# Patient Record
Sex: Female | Born: 1940 | Race: White | Hispanic: No | Marital: Married | Smoking: Never smoker
Health system: Southern US, Community
[De-identification: ages and names within clinical notes are randomized; demographics above are authoritative.]

## PROBLEM LIST (undated history)

## (undated) DIAGNOSIS — M8580 Other specified disorders of bone density and structure, unspecified site: Secondary | ICD-10-CM

## (undated) DIAGNOSIS — I10 Essential (primary) hypertension: Secondary | ICD-10-CM

## (undated) DIAGNOSIS — M5136 Other intervertebral disc degeneration, lumbar region: Secondary | ICD-10-CM

## (undated) DIAGNOSIS — E222 Syndrome of inappropriate secretion of antidiuretic hormone: Secondary | ICD-10-CM

## (undated) DIAGNOSIS — A4902 Methicillin resistant Staphylococcus aureus infection, unspecified site: Secondary | ICD-10-CM

## (undated) DIAGNOSIS — IMO0002 Reserved for concepts with insufficient information to code with codable children: Secondary | ICD-10-CM

## (undated) DIAGNOSIS — N841 Polyp of cervix uteri: Secondary | ICD-10-CM

## (undated) DIAGNOSIS — D649 Anemia, unspecified: Secondary | ICD-10-CM

## (undated) DIAGNOSIS — R519 Headache, unspecified: Secondary | ICD-10-CM

## (undated) DIAGNOSIS — R87619 Unspecified abnormal cytological findings in specimens from cervix uteri: Secondary | ICD-10-CM

## (undated) DIAGNOSIS — K219 Gastro-esophageal reflux disease without esophagitis: Secondary | ICD-10-CM

## (undated) DIAGNOSIS — I8390 Asymptomatic varicose veins of unspecified lower extremity: Secondary | ICD-10-CM

## (undated) DIAGNOSIS — Z9109 Other allergy status, other than to drugs and biological substances: Secondary | ICD-10-CM

## (undated) DIAGNOSIS — E871 Hypo-osmolality and hyponatremia: Secondary | ICD-10-CM

## (undated) DIAGNOSIS — R51 Headache: Secondary | ICD-10-CM

## (undated) DIAGNOSIS — N870 Mild cervical dysplasia: Secondary | ICD-10-CM

## (undated) DIAGNOSIS — C4330 Malignant melanoma of unspecified part of face: Secondary | ICD-10-CM

## (undated) DIAGNOSIS — M47818 Spondylosis without myelopathy or radiculopathy, sacral and sacrococcygeal region: Secondary | ICD-10-CM

## (undated) DIAGNOSIS — N3010 Interstitial cystitis (chronic) without hematuria: Secondary | ICD-10-CM

## (undated) DIAGNOSIS — M461 Sacroiliitis, not elsewhere classified: Secondary | ICD-10-CM

## (undated) DIAGNOSIS — M1990 Unspecified osteoarthritis, unspecified site: Secondary | ICD-10-CM

## (undated) HISTORY — DX: Interstitial cystitis (chronic) without hematuria: N30.10

## (undated) HISTORY — DX: Gastro-esophageal reflux disease without esophagitis: K21.9

## (undated) HISTORY — PX: WISDOM TOOTH EXTRACTION: SHX21

## (undated) HISTORY — DX: Reserved for concepts with insufficient information to code with codable children: IMO0002

## (undated) HISTORY — DX: Mild cervical dysplasia: N87.0

## (undated) HISTORY — DX: Malignant melanoma of unspecified part of face: C43.30

## (undated) HISTORY — DX: Other intervertebral disc degeneration, lumbar region: M51.36

## (undated) HISTORY — DX: Syndrome of inappropriate secretion of antidiuretic hormone: E22.2

## (undated) HISTORY — DX: Essential (primary) hypertension: I10

## (undated) HISTORY — DX: Other allergy status, other than to drugs and biological substances: Z91.09

## (undated) HISTORY — PX: MELANOMA EXCISION: SHX5266

## (undated) HISTORY — DX: Hypo-osmolality and hyponatremia: E87.1

## (undated) HISTORY — DX: Other specified disorders of bone density and structure, unspecified site: M85.80

## (undated) HISTORY — DX: Polyp of cervix uteri: N84.1

## (undated) HISTORY — PX: HAND SURGERY: SHX662

## (undated) HISTORY — PX: HERNIA REPAIR: SHX51

## (undated) HISTORY — DX: Unspecified osteoarthritis, unspecified site: M19.90

## (undated) HISTORY — DX: Unspecified abnormal cytological findings in specimens from cervix uteri: R87.619

---

## 1995-12-16 DIAGNOSIS — N841 Polyp of cervix uteri: Secondary | ICD-10-CM

## 1995-12-16 HISTORY — DX: Polyp of cervix uteri: N84.1

## 1996-12-15 DIAGNOSIS — E222 Syndrome of inappropriate secretion of antidiuretic hormone: Secondary | ICD-10-CM

## 1996-12-15 HISTORY — DX: Syndrome of inappropriate secretion of antidiuretic hormone: E22.2

## 1998-12-15 DIAGNOSIS — R87619 Unspecified abnormal cytological findings in specimens from cervix uteri: Secondary | ICD-10-CM

## 1998-12-15 HISTORY — DX: Unspecified abnormal cytological findings in specimens from cervix uteri: R87.619

## 1999-10-30 ENCOUNTER — Other Ambulatory Visit: Admission: RE | Admit: 1999-10-30 | Discharge: 1999-10-30 | Payer: Self-pay | Admitting: Obstetrics and Gynecology

## 1999-11-15 ENCOUNTER — Other Ambulatory Visit: Admission: RE | Admit: 1999-11-15 | Discharge: 1999-11-15 | Payer: Self-pay | Admitting: Obstetrics and Gynecology

## 1999-11-15 ENCOUNTER — Encounter (INDEPENDENT_AMBULATORY_CARE_PROVIDER_SITE_OTHER): Payer: Self-pay

## 2000-02-07 ENCOUNTER — Ambulatory Visit (HOSPITAL_BASED_OUTPATIENT_CLINIC_OR_DEPARTMENT_OTHER): Admission: RE | Admit: 2000-02-07 | Discharge: 2000-02-07 | Payer: Self-pay

## 2000-02-24 ENCOUNTER — Other Ambulatory Visit: Admission: RE | Admit: 2000-02-24 | Discharge: 2000-02-24 | Payer: Self-pay | Admitting: Obstetrics and Gynecology

## 2000-06-09 ENCOUNTER — Other Ambulatory Visit: Admission: RE | Admit: 2000-06-09 | Discharge: 2000-06-09 | Payer: Self-pay | Admitting: Obstetrics and Gynecology

## 2000-10-29 ENCOUNTER — Other Ambulatory Visit: Admission: RE | Admit: 2000-10-29 | Discharge: 2000-10-29 | Payer: Self-pay | Admitting: Obstetrics and Gynecology

## 2001-08-10 ENCOUNTER — Other Ambulatory Visit: Admission: RE | Admit: 2001-08-10 | Discharge: 2001-08-10 | Payer: Self-pay | Admitting: Obstetrics and Gynecology

## 2002-07-26 ENCOUNTER — Other Ambulatory Visit: Admission: RE | Admit: 2002-07-26 | Discharge: 2002-07-26 | Payer: Self-pay | Admitting: Obstetrics and Gynecology

## 2002-12-15 DIAGNOSIS — N3010 Interstitial cystitis (chronic) without hematuria: Secondary | ICD-10-CM

## 2002-12-15 HISTORY — DX: Interstitial cystitis (chronic) without hematuria: N30.10

## 2003-08-08 ENCOUNTER — Other Ambulatory Visit: Admission: RE | Admit: 2003-08-08 | Discharge: 2003-08-08 | Payer: Self-pay | Admitting: Obstetrics and Gynecology

## 2004-09-16 ENCOUNTER — Other Ambulatory Visit: Admission: RE | Admit: 2004-09-16 | Discharge: 2004-09-16 | Payer: Self-pay | Admitting: Obstetrics and Gynecology

## 2005-09-30 ENCOUNTER — Other Ambulatory Visit: Admission: RE | Admit: 2005-09-30 | Discharge: 2005-09-30 | Payer: Self-pay | Admitting: Obstetrics and Gynecology

## 2006-12-22 ENCOUNTER — Other Ambulatory Visit: Admission: RE | Admit: 2006-12-22 | Discharge: 2006-12-22 | Payer: Self-pay | Admitting: Obstetrics and Gynecology

## 2007-04-15 DIAGNOSIS — C4330 Malignant melanoma of unspecified part of face: Secondary | ICD-10-CM

## 2007-04-15 HISTORY — DX: Malignant melanoma of unspecified part of face: C43.30

## 2007-12-27 ENCOUNTER — Other Ambulatory Visit: Admission: RE | Admit: 2007-12-27 | Discharge: 2007-12-27 | Payer: Self-pay | Admitting: Obstetrics and Gynecology

## 2008-12-25 ENCOUNTER — Other Ambulatory Visit: Admission: RE | Admit: 2008-12-25 | Discharge: 2008-12-25 | Payer: Self-pay | Admitting: Obstetrics and Gynecology

## 2011-04-30 ENCOUNTER — Other Ambulatory Visit: Payer: Self-pay | Admitting: Internal Medicine

## 2011-05-02 ENCOUNTER — Ambulatory Visit
Admission: RE | Admit: 2011-05-02 | Discharge: 2011-05-02 | Disposition: A | Discharge status: Home / Self Care | Payer: Medicare Other | Source: Ambulatory Visit | Attending: Internal Medicine | Admitting: Internal Medicine

## 2011-05-02 MED ORDER — IOHEXOL 300 MG/ML  SOLN
100.0000 mL | Freq: Once | INTRAMUSCULAR | Status: AC | PRN
  Administered 2011-05-02: 100 mL via INTRAVENOUS

## 2011-05-02 NOTE — Op Note (Signed)
[place]  Patient:    Teresa Morales, Teresa Morales                        MRN: [id number] Proc. Date: 02/07/00 Adm. Date:  [id number] Attending:  Gennie Alma CC:         Genene Churn. Sherin Quarry, M.D.                           Operative Report  CCS# [id number]  PREOPERATIVE DIAGNOSIS:  Chronically incarcerated umbilical hernia.  POSTOPERATIVE DIAGNOSIS:  Chronically incarcerated umbilical hernia.  OPERATION PERFORMED:  Repair of umbilical hernia.  SURGEON:  Milus Mallick, M.D.  ANESTHESIA:  General endotracheal.  DESCRIPTION OF PROCEDURE: Under adequate general endotracheal anesthesia, the patients abdomen was prepared and draped in the usual fashion.  There was a 2.5 cm diameter soft mass underlying the umbilicus.  An infraumbilical incision was made and carried down into the subcutaneous space and almost immediately, a portion of lobulated fat exited the wound and had the appearance of preperitoneal fat. It was adherent to the underside of the umbilicus adjacent to the hernia sac that as quite narrow and of very small caliber.  It was dissected off of the umbilical lap and divided and at that point it could be seen that this was a chronically incarcerated umbilical hernia with the preperitoneal fat being what was incarcerated.  The preperitoneal fat was excised over hemostats which were ligated with 3-0 Vicryl.  At this point one could delineate the hernia defect which was  about 5 mm in diameter.  It was repaired with interrupted sutures of Surgilon, three in number.  The undersurface of the umbilical skin flap was fixed to the fascia with 3-0 Vicryl.  The subcuticular layer was reapproximated with a continuous suture of 5-0 Vicryl and half-inch Steri-Strips were applied to the skin.  Sterile dressing was applied.  Estimated blood loss for this procedure was negligible.  The patient tolerated the procedure well and left the operating room in  satisfactory condition. DD:  02/07/00 TD:  02/07/00 Job: [id number]

## 2011-12-19 DIAGNOSIS — J309 Allergic rhinitis, unspecified: Secondary | ICD-10-CM | POA: Diagnosis not present

## 2011-12-24 DIAGNOSIS — T6391XA Toxic effect of contact with unspecified venomous animal, accidental (unintentional), initial encounter: Secondary | ICD-10-CM | POA: Diagnosis not present

## 2011-12-26 DIAGNOSIS — J309 Allergic rhinitis, unspecified: Secondary | ICD-10-CM | POA: Diagnosis not present

## 2012-01-01 DIAGNOSIS — J309 Allergic rhinitis, unspecified: Secondary | ICD-10-CM | POA: Diagnosis not present

## 2012-01-15 DIAGNOSIS — Z1231 Encounter for screening mammogram for malignant neoplasm of breast: Secondary | ICD-10-CM | POA: Diagnosis not present

## 2012-01-21 DIAGNOSIS — T6391XA Toxic effect of contact with unspecified venomous animal, accidental (unintentional), initial encounter: Secondary | ICD-10-CM | POA: Diagnosis not present

## 2012-01-26 DIAGNOSIS — J309 Allergic rhinitis, unspecified: Secondary | ICD-10-CM | POA: Diagnosis not present

## 2012-01-26 DIAGNOSIS — R05 Cough: Secondary | ICD-10-CM | POA: Diagnosis not present

## 2012-01-30 DIAGNOSIS — J309 Allergic rhinitis, unspecified: Secondary | ICD-10-CM | POA: Diagnosis not present

## 2012-02-04 DIAGNOSIS — R279 Unspecified lack of coordination: Secondary | ICD-10-CM | POA: Diagnosis not present

## 2012-02-04 DIAGNOSIS — Z0000 Encounter for general adult medical examination without abnormal findings: Secondary | ICD-10-CM | POA: Diagnosis not present

## 2012-02-04 DIAGNOSIS — N3946 Mixed incontinence: Secondary | ICD-10-CM | POA: Diagnosis not present

## 2012-02-04 DIAGNOSIS — Z01419 Encounter for gynecological examination (general) (routine) without abnormal findings: Secondary | ICD-10-CM | POA: Diagnosis not present

## 2012-02-04 DIAGNOSIS — M6281 Muscle weakness (generalized): Secondary | ICD-10-CM | POA: Diagnosis not present

## 2012-02-04 DIAGNOSIS — Z124 Encounter for screening for malignant neoplasm of cervix: Secondary | ICD-10-CM | POA: Diagnosis not present

## 2012-02-27 DIAGNOSIS — T6391XA Toxic effect of contact with unspecified venomous animal, accidental (unintentional), initial encounter: Secondary | ICD-10-CM | POA: Diagnosis not present

## 2012-03-01 DIAGNOSIS — T6391XA Toxic effect of contact with unspecified venomous animal, accidental (unintentional), initial encounter: Secondary | ICD-10-CM | POA: Diagnosis not present

## 2012-03-04 DIAGNOSIS — J309 Allergic rhinitis, unspecified: Secondary | ICD-10-CM | POA: Diagnosis not present

## 2012-03-16 DIAGNOSIS — N3000 Acute cystitis without hematuria: Secondary | ICD-10-CM | POA: Diagnosis not present

## 2012-03-24 DIAGNOSIS — B373 Candidiasis of vulva and vagina: Secondary | ICD-10-CM | POA: Diagnosis not present

## 2012-03-24 DIAGNOSIS — N390 Urinary tract infection, site not specified: Secondary | ICD-10-CM | POA: Diagnosis not present

## 2012-03-29 DIAGNOSIS — R05 Cough: Secondary | ICD-10-CM | POA: Diagnosis not present

## 2012-03-29 DIAGNOSIS — T6391XA Toxic effect of contact with unspecified venomous animal, accidental (unintentional), initial encounter: Secondary | ICD-10-CM | POA: Diagnosis not present

## 2012-04-01 DIAGNOSIS — J309 Allergic rhinitis, unspecified: Secondary | ICD-10-CM | POA: Diagnosis not present

## 2012-04-02 DIAGNOSIS — J309 Allergic rhinitis, unspecified: Secondary | ICD-10-CM | POA: Diagnosis not present

## 2012-04-05 DIAGNOSIS — J309 Allergic rhinitis, unspecified: Secondary | ICD-10-CM | POA: Diagnosis not present

## 2012-04-09 DIAGNOSIS — J309 Allergic rhinitis, unspecified: Secondary | ICD-10-CM | POA: Diagnosis not present

## 2012-04-13 DIAGNOSIS — IMO0002 Reserved for concepts with insufficient information to code with codable children: Secondary | ICD-10-CM | POA: Diagnosis not present

## 2012-04-16 DIAGNOSIS — J309 Allergic rhinitis, unspecified: Secondary | ICD-10-CM | POA: Diagnosis not present

## 2012-04-22 DIAGNOSIS — J309 Allergic rhinitis, unspecified: Secondary | ICD-10-CM | POA: Diagnosis not present

## 2012-04-22 DIAGNOSIS — L820 Inflamed seborrheic keratosis: Secondary | ICD-10-CM | POA: Diagnosis not present

## 2012-04-22 DIAGNOSIS — Z85820 Personal history of malignant melanoma of skin: Secondary | ICD-10-CM | POA: Diagnosis not present

## 2012-04-22 DIAGNOSIS — L821 Other seborrheic keratosis: Secondary | ICD-10-CM | POA: Diagnosis not present

## 2012-04-22 DIAGNOSIS — L570 Actinic keratosis: Secondary | ICD-10-CM | POA: Diagnosis not present

## 2012-04-22 DIAGNOSIS — Z85828 Personal history of other malignant neoplasm of skin: Secondary | ICD-10-CM | POA: Diagnosis not present

## 2012-04-26 DIAGNOSIS — T6391XA Toxic effect of contact with unspecified venomous animal, accidental (unintentional), initial encounter: Secondary | ICD-10-CM | POA: Diagnosis not present

## 2012-04-28 DIAGNOSIS — Z79899 Other long term (current) drug therapy: Secondary | ICD-10-CM | POA: Diagnosis not present

## 2012-04-28 DIAGNOSIS — E559 Vitamin D deficiency, unspecified: Secondary | ICD-10-CM | POA: Diagnosis not present

## 2012-04-28 DIAGNOSIS — Z1331 Encounter for screening for depression: Secondary | ICD-10-CM | POA: Diagnosis not present

## 2012-04-28 DIAGNOSIS — I10 Essential (primary) hypertension: Secondary | ICD-10-CM | POA: Diagnosis not present

## 2012-04-28 DIAGNOSIS — Z0000 Encounter for general adult medical examination without abnormal findings: Secondary | ICD-10-CM | POA: Diagnosis not present

## 2012-04-28 DIAGNOSIS — N8111 Cystocele, midline: Secondary | ICD-10-CM | POA: Diagnosis not present

## 2012-04-28 DIAGNOSIS — N816 Rectocele: Secondary | ICD-10-CM | POA: Diagnosis not present

## 2012-04-28 DIAGNOSIS — G4700 Insomnia, unspecified: Secondary | ICD-10-CM | POA: Diagnosis not present

## 2012-04-28 DIAGNOSIS — M2550 Pain in unspecified joint: Secondary | ICD-10-CM | POA: Diagnosis not present

## 2012-04-28 DIAGNOSIS — K219 Gastro-esophageal reflux disease without esophagitis: Secondary | ICD-10-CM | POA: Diagnosis not present

## 2012-04-30 DIAGNOSIS — T6391XA Toxic effect of contact with unspecified venomous animal, accidental (unintentional), initial encounter: Secondary | ICD-10-CM | POA: Diagnosis not present

## 2012-05-06 DIAGNOSIS — J309 Allergic rhinitis, unspecified: Secondary | ICD-10-CM | POA: Diagnosis not present

## 2012-05-14 DIAGNOSIS — J309 Allergic rhinitis, unspecified: Secondary | ICD-10-CM | POA: Diagnosis not present

## 2012-05-20 DIAGNOSIS — J309 Allergic rhinitis, unspecified: Secondary | ICD-10-CM | POA: Diagnosis not present

## 2012-05-24 DIAGNOSIS — T6391XA Toxic effect of contact with unspecified venomous animal, accidental (unintentional), initial encounter: Secondary | ICD-10-CM | POA: Diagnosis not present

## 2012-05-28 DIAGNOSIS — J309 Allergic rhinitis, unspecified: Secondary | ICD-10-CM | POA: Diagnosis not present

## 2012-05-28 DIAGNOSIS — J019 Acute sinusitis, unspecified: Secondary | ICD-10-CM | POA: Diagnosis not present

## 2012-06-01 DIAGNOSIS — J309 Allergic rhinitis, unspecified: Secondary | ICD-10-CM | POA: Diagnosis not present

## 2012-06-01 DIAGNOSIS — J019 Acute sinusitis, unspecified: Secondary | ICD-10-CM | POA: Diagnosis not present

## 2012-06-07 DIAGNOSIS — J309 Allergic rhinitis, unspecified: Secondary | ICD-10-CM | POA: Diagnosis not present

## 2012-06-15 DIAGNOSIS — J309 Allergic rhinitis, unspecified: Secondary | ICD-10-CM | POA: Diagnosis not present

## 2012-06-21 DIAGNOSIS — T6391XA Toxic effect of contact with unspecified venomous animal, accidental (unintentional), initial encounter: Secondary | ICD-10-CM | POA: Diagnosis not present

## 2012-06-25 DIAGNOSIS — J309 Allergic rhinitis, unspecified: Secondary | ICD-10-CM | POA: Diagnosis not present

## 2012-06-29 DIAGNOSIS — B373 Candidiasis of vulva and vagina: Secondary | ICD-10-CM | POA: Diagnosis not present

## 2012-06-29 DIAGNOSIS — R319 Hematuria, unspecified: Secondary | ICD-10-CM | POA: Diagnosis not present

## 2012-06-29 DIAGNOSIS — N360 Urethral fistula: Secondary | ICD-10-CM | POA: Diagnosis not present

## 2012-07-02 DIAGNOSIS — R1084 Generalized abdominal pain: Secondary | ICD-10-CM | POA: Diagnosis not present

## 2012-07-02 DIAGNOSIS — J309 Allergic rhinitis, unspecified: Secondary | ICD-10-CM | POA: Diagnosis not present

## 2012-07-05 DIAGNOSIS — R109 Unspecified abdominal pain: Secondary | ICD-10-CM | POA: Diagnosis not present

## 2012-07-09 DIAGNOSIS — J309 Allergic rhinitis, unspecified: Secondary | ICD-10-CM | POA: Diagnosis not present

## 2012-07-10 DIAGNOSIS — M25579 Pain in unspecified ankle and joints of unspecified foot: Secondary | ICD-10-CM | POA: Diagnosis not present

## 2012-07-10 DIAGNOSIS — M2540 Effusion, unspecified joint: Secondary | ICD-10-CM | POA: Diagnosis not present

## 2012-07-16 DIAGNOSIS — J309 Allergic rhinitis, unspecified: Secondary | ICD-10-CM | POA: Diagnosis not present

## 2012-07-19 DIAGNOSIS — T6391XA Toxic effect of contact with unspecified venomous animal, accidental (unintentional), initial encounter: Secondary | ICD-10-CM | POA: Diagnosis not present

## 2012-07-23 DIAGNOSIS — T6391XA Toxic effect of contact with unspecified venomous animal, accidental (unintentional), initial encounter: Secondary | ICD-10-CM | POA: Diagnosis not present

## 2012-07-29 DIAGNOSIS — B351 Tinea unguium: Secondary | ICD-10-CM | POA: Diagnosis not present

## 2012-07-29 DIAGNOSIS — L570 Actinic keratosis: Secondary | ICD-10-CM | POA: Diagnosis not present

## 2012-07-29 DIAGNOSIS — J309 Allergic rhinitis, unspecified: Secondary | ICD-10-CM | POA: Diagnosis not present

## 2012-08-02 DIAGNOSIS — R05 Cough: Secondary | ICD-10-CM | POA: Diagnosis not present

## 2012-08-06 DIAGNOSIS — J309 Allergic rhinitis, unspecified: Secondary | ICD-10-CM | POA: Diagnosis not present

## 2012-08-13 DIAGNOSIS — J309 Allergic rhinitis, unspecified: Secondary | ICD-10-CM | POA: Diagnosis not present

## 2012-08-18 DIAGNOSIS — T6391XA Toxic effect of contact with unspecified venomous animal, accidental (unintentional), initial encounter: Secondary | ICD-10-CM | POA: Diagnosis not present

## 2012-08-20 DIAGNOSIS — J309 Allergic rhinitis, unspecified: Secondary | ICD-10-CM | POA: Diagnosis not present

## 2012-08-27 DIAGNOSIS — J309 Allergic rhinitis, unspecified: Secondary | ICD-10-CM | POA: Diagnosis not present

## 2012-09-03 DIAGNOSIS — J309 Allergic rhinitis, unspecified: Secondary | ICD-10-CM | POA: Diagnosis not present

## 2012-09-09 DIAGNOSIS — J309 Allergic rhinitis, unspecified: Secondary | ICD-10-CM | POA: Diagnosis not present

## 2012-09-13 DIAGNOSIS — I8310 Varicose veins of unspecified lower extremity with inflammation: Secondary | ICD-10-CM | POA: Diagnosis not present

## 2012-09-15 DIAGNOSIS — I8310 Varicose veins of unspecified lower extremity with inflammation: Secondary | ICD-10-CM | POA: Diagnosis not present

## 2012-09-17 DIAGNOSIS — R05 Cough: Secondary | ICD-10-CM | POA: Diagnosis not present

## 2012-09-17 DIAGNOSIS — T6391XA Toxic effect of contact with unspecified venomous animal, accidental (unintentional), initial encounter: Secondary | ICD-10-CM | POA: Diagnosis not present

## 2012-09-23 DIAGNOSIS — I8310 Varicose veins of unspecified lower extremity with inflammation: Secondary | ICD-10-CM | POA: Diagnosis not present

## 2012-09-24 DIAGNOSIS — J309 Allergic rhinitis, unspecified: Secondary | ICD-10-CM | POA: Diagnosis not present

## 2012-09-27 DIAGNOSIS — T6391XA Toxic effect of contact with unspecified venomous animal, accidental (unintentional), initial encounter: Secondary | ICD-10-CM | POA: Diagnosis not present

## 2012-10-08 DIAGNOSIS — J309 Allergic rhinitis, unspecified: Secondary | ICD-10-CM | POA: Diagnosis not present

## 2012-10-15 DIAGNOSIS — J309 Allergic rhinitis, unspecified: Secondary | ICD-10-CM | POA: Diagnosis not present

## 2012-10-18 DIAGNOSIS — H1044 Vernal conjunctivitis: Secondary | ICD-10-CM | POA: Diagnosis not present

## 2012-10-21 DIAGNOSIS — Z23 Encounter for immunization: Secondary | ICD-10-CM | POA: Diagnosis not present

## 2012-10-25 DIAGNOSIS — I8310 Varicose veins of unspecified lower extremity with inflammation: Secondary | ICD-10-CM | POA: Diagnosis not present

## 2012-10-27 DIAGNOSIS — I8310 Varicose veins of unspecified lower extremity with inflammation: Secondary | ICD-10-CM | POA: Diagnosis not present

## 2012-10-27 DIAGNOSIS — M79609 Pain in unspecified limb: Secondary | ICD-10-CM | POA: Diagnosis not present

## 2012-10-29 DIAGNOSIS — J309 Allergic rhinitis, unspecified: Secondary | ICD-10-CM | POA: Diagnosis not present

## 2012-11-01 DIAGNOSIS — J309 Allergic rhinitis, unspecified: Secondary | ICD-10-CM | POA: Diagnosis not present

## 2012-11-04 DIAGNOSIS — J309 Allergic rhinitis, unspecified: Secondary | ICD-10-CM | POA: Diagnosis not present

## 2012-11-10 DIAGNOSIS — J309 Allergic rhinitis, unspecified: Secondary | ICD-10-CM | POA: Diagnosis not present

## 2012-11-17 DIAGNOSIS — I8390 Asymptomatic varicose veins of unspecified lower extremity: Secondary | ICD-10-CM | POA: Diagnosis not present

## 2012-11-17 DIAGNOSIS — D692 Other nonthrombocytopenic purpura: Secondary | ICD-10-CM | POA: Diagnosis not present

## 2012-11-17 DIAGNOSIS — D1801 Hemangioma of skin and subcutaneous tissue: Secondary | ICD-10-CM | POA: Diagnosis not present

## 2012-11-17 DIAGNOSIS — L821 Other seborrheic keratosis: Secondary | ICD-10-CM | POA: Diagnosis not present

## 2012-11-17 DIAGNOSIS — Z85820 Personal history of malignant melanoma of skin: Secondary | ICD-10-CM | POA: Diagnosis not present

## 2012-11-18 DIAGNOSIS — M79609 Pain in unspecified limb: Secondary | ICD-10-CM | POA: Diagnosis not present

## 2012-11-18 DIAGNOSIS — I8310 Varicose veins of unspecified lower extremity with inflammation: Secondary | ICD-10-CM | POA: Diagnosis not present

## 2012-11-19 DIAGNOSIS — J309 Allergic rhinitis, unspecified: Secondary | ICD-10-CM | POA: Diagnosis not present

## 2012-11-22 DIAGNOSIS — J309 Allergic rhinitis, unspecified: Secondary | ICD-10-CM | POA: Diagnosis not present

## 2012-11-29 DIAGNOSIS — T6391XA Toxic effect of contact with unspecified venomous animal, accidental (unintentional), initial encounter: Secondary | ICD-10-CM | POA: Diagnosis not present

## 2012-12-03 DIAGNOSIS — T6391XA Toxic effect of contact with unspecified venomous animal, accidental (unintentional), initial encounter: Secondary | ICD-10-CM | POA: Diagnosis not present

## 2012-12-16 DIAGNOSIS — M7981 Nontraumatic hematoma of soft tissue: Secondary | ICD-10-CM | POA: Diagnosis not present

## 2012-12-16 DIAGNOSIS — M79609 Pain in unspecified limb: Secondary | ICD-10-CM | POA: Diagnosis not present

## 2012-12-16 DIAGNOSIS — I8310 Varicose veins of unspecified lower extremity with inflammation: Secondary | ICD-10-CM | POA: Diagnosis not present

## 2012-12-24 DIAGNOSIS — J309 Allergic rhinitis, unspecified: Secondary | ICD-10-CM | POA: Diagnosis not present

## 2012-12-31 DIAGNOSIS — T6391XA Toxic effect of contact with unspecified venomous animal, accidental (unintentional), initial encounter: Secondary | ICD-10-CM | POA: Diagnosis not present

## 2013-01-05 ENCOUNTER — Ambulatory Visit: Payer: Medicare Other | Attending: Sports Medicine | Admitting: Physical Therapy

## 2013-01-05 DIAGNOSIS — M25559 Pain in unspecified hip: Secondary | ICD-10-CM | POA: Insufficient documentation

## 2013-01-05 DIAGNOSIS — IMO0001 Reserved for inherently not codable concepts without codable children: Secondary | ICD-10-CM | POA: Insufficient documentation

## 2013-01-05 DIAGNOSIS — M2569 Stiffness of other specified joint, not elsewhere classified: Secondary | ICD-10-CM | POA: Insufficient documentation

## 2013-01-05 DIAGNOSIS — M545 Low back pain, unspecified: Secondary | ICD-10-CM | POA: Diagnosis not present

## 2013-01-07 ENCOUNTER — Ambulatory Visit: Payer: Medicare Other | Admitting: Physical Therapy

## 2013-01-10 DIAGNOSIS — M7981 Nontraumatic hematoma of soft tissue: Secondary | ICD-10-CM | POA: Diagnosis not present

## 2013-01-11 ENCOUNTER — Ambulatory Visit: Payer: Medicare Other | Admitting: Physical Therapy

## 2013-01-13 ENCOUNTER — Ambulatory Visit: Payer: Medicare Other | Admitting: Physical Therapy

## 2013-01-13 DIAGNOSIS — J309 Allergic rhinitis, unspecified: Secondary | ICD-10-CM | POA: Diagnosis not present

## 2013-01-18 ENCOUNTER — Ambulatory Visit: Payer: Medicare Other | Admitting: Physical Therapy

## 2013-01-18 DIAGNOSIS — J45909 Unspecified asthma, uncomplicated: Secondary | ICD-10-CM | POA: Diagnosis not present

## 2013-01-18 DIAGNOSIS — J309 Allergic rhinitis, unspecified: Secondary | ICD-10-CM | POA: Diagnosis not present

## 2013-01-20 ENCOUNTER — Ambulatory Visit: Payer: Medicare Other | Attending: Sports Medicine | Admitting: Physical Therapy

## 2013-01-20 DIAGNOSIS — M545 Low back pain, unspecified: Secondary | ICD-10-CM | POA: Insufficient documentation

## 2013-01-20 DIAGNOSIS — M2569 Stiffness of other specified joint, not elsewhere classified: Secondary | ICD-10-CM | POA: Diagnosis not present

## 2013-01-20 DIAGNOSIS — IMO0001 Reserved for inherently not codable concepts without codable children: Secondary | ICD-10-CM | POA: Diagnosis not present

## 2013-01-20 DIAGNOSIS — M25559 Pain in unspecified hip: Secondary | ICD-10-CM | POA: Insufficient documentation

## 2013-01-25 ENCOUNTER — Ambulatory Visit: Payer: Medicare Other | Admitting: Physical Therapy

## 2013-01-27 ENCOUNTER — Ambulatory Visit: Payer: Medicare Other | Admitting: Physical Therapy

## 2013-01-28 ENCOUNTER — Encounter: Payer: Medicare Other | Admitting: Physical Therapy

## 2013-02-01 ENCOUNTER — Ambulatory Visit: Payer: Medicare Other | Admitting: Physical Therapy

## 2013-02-02 DIAGNOSIS — J309 Allergic rhinitis, unspecified: Secondary | ICD-10-CM | POA: Diagnosis not present

## 2013-02-02 DIAGNOSIS — Z1231 Encounter for screening mammogram for malignant neoplasm of breast: Secondary | ICD-10-CM | POA: Diagnosis not present

## 2013-02-02 DIAGNOSIS — Z8262 Family history of osteoporosis: Secondary | ICD-10-CM | POA: Diagnosis not present

## 2013-02-02 DIAGNOSIS — M899 Disorder of bone, unspecified: Secondary | ICD-10-CM | POA: Diagnosis not present

## 2013-02-03 ENCOUNTER — Ambulatory Visit: Payer: Medicare Other | Admitting: Physical Therapy

## 2013-02-09 DIAGNOSIS — M899 Disorder of bone, unspecified: Secondary | ICD-10-CM | POA: Diagnosis not present

## 2013-02-09 DIAGNOSIS — Z01419 Encounter for gynecological examination (general) (routine) without abnormal findings: Secondary | ICD-10-CM | POA: Diagnosis not present

## 2013-02-10 ENCOUNTER — Ambulatory Visit: Payer: Medicare Other | Admitting: Physical Therapy

## 2013-02-23 DIAGNOSIS — J309 Allergic rhinitis, unspecified: Secondary | ICD-10-CM | POA: Diagnosis not present

## 2013-02-28 DIAGNOSIS — J309 Allergic rhinitis, unspecified: Secondary | ICD-10-CM | POA: Diagnosis not present

## 2013-03-07 DIAGNOSIS — T6391XA Toxic effect of contact with unspecified venomous animal, accidental (unintentional), initial encounter: Secondary | ICD-10-CM | POA: Diagnosis not present

## 2013-03-11 DIAGNOSIS — J309 Allergic rhinitis, unspecified: Secondary | ICD-10-CM | POA: Diagnosis not present

## 2013-03-18 DIAGNOSIS — J309 Allergic rhinitis, unspecified: Secondary | ICD-10-CM | POA: Diagnosis not present

## 2013-04-04 DIAGNOSIS — J309 Allergic rhinitis, unspecified: Secondary | ICD-10-CM | POA: Diagnosis not present

## 2013-04-08 DIAGNOSIS — T6391XA Toxic effect of contact with unspecified venomous animal, accidental (unintentional), initial encounter: Secondary | ICD-10-CM | POA: Diagnosis not present

## 2013-04-21 DIAGNOSIS — L820 Inflamed seborrheic keratosis: Secondary | ICD-10-CM | POA: Diagnosis not present

## 2013-04-21 DIAGNOSIS — Z85828 Personal history of other malignant neoplasm of skin: Secondary | ICD-10-CM | POA: Diagnosis not present

## 2013-04-21 DIAGNOSIS — T6391XA Toxic effect of contact with unspecified venomous animal, accidental (unintentional), initial encounter: Secondary | ICD-10-CM | POA: Diagnosis not present

## 2013-04-28 DIAGNOSIS — J309 Allergic rhinitis, unspecified: Secondary | ICD-10-CM | POA: Diagnosis not present

## 2013-05-06 DIAGNOSIS — J309 Allergic rhinitis, unspecified: Secondary | ICD-10-CM | POA: Diagnosis not present

## 2013-05-16 DIAGNOSIS — T6391XA Toxic effect of contact with unspecified venomous animal, accidental (unintentional), initial encounter: Secondary | ICD-10-CM | POA: Diagnosis not present

## 2013-05-19 DIAGNOSIS — L819 Disorder of pigmentation, unspecified: Secondary | ICD-10-CM | POA: Diagnosis not present

## 2013-05-19 DIAGNOSIS — Z85828 Personal history of other malignant neoplasm of skin: Secondary | ICD-10-CM | POA: Diagnosis not present

## 2013-05-19 DIAGNOSIS — D485 Neoplasm of uncertain behavior of skin: Secondary | ICD-10-CM | POA: Diagnosis not present

## 2013-05-19 DIAGNOSIS — L821 Other seborrheic keratosis: Secondary | ICD-10-CM | POA: Diagnosis not present

## 2013-05-19 DIAGNOSIS — Z85820 Personal history of malignant melanoma of skin: Secondary | ICD-10-CM | POA: Diagnosis not present

## 2013-05-19 DIAGNOSIS — L723 Sebaceous cyst: Secondary | ICD-10-CM | POA: Diagnosis not present

## 2013-05-19 DIAGNOSIS — D1801 Hemangioma of skin and subcutaneous tissue: Secondary | ICD-10-CM | POA: Diagnosis not present

## 2013-05-20 DIAGNOSIS — H6120 Impacted cerumen, unspecified ear: Secondary | ICD-10-CM | POA: Diagnosis not present

## 2013-05-20 DIAGNOSIS — H9209 Otalgia, unspecified ear: Secondary | ICD-10-CM | POA: Diagnosis not present

## 2013-05-23 DIAGNOSIS — J309 Allergic rhinitis, unspecified: Secondary | ICD-10-CM | POA: Diagnosis not present

## 2013-06-02 ENCOUNTER — Encounter: Payer: Self-pay | Admitting: Obstetrics and Gynecology

## 2013-06-03 ENCOUNTER — Ambulatory Visit (INDEPENDENT_AMBULATORY_CARE_PROVIDER_SITE_OTHER): Payer: Medicare Other | Admitting: Obstetrics and Gynecology

## 2013-06-03 ENCOUNTER — Encounter: Payer: Self-pay | Admitting: Gynecology

## 2013-06-03 VITALS — BP 120/60 | HR 76 | Ht 63.0 in | Wt 119.0 lb

## 2013-06-03 DIAGNOSIS — J309 Allergic rhinitis, unspecified: Secondary | ICD-10-CM | POA: Diagnosis not present

## 2013-06-03 DIAGNOSIS — B373 Candidiasis of vulva and vagina: Secondary | ICD-10-CM

## 2013-06-03 DIAGNOSIS — B3731 Acute candidiasis of vulva and vagina: Secondary | ICD-10-CM

## 2013-06-03 MED ORDER — TERCONAZOLE 0.8 % VA CREA: 1.0000 | TOPICAL_CREAM | Freq: Every day | VAGINAL | Status: DC

## 2013-06-03 NOTE — Patient Instructions (Addendum)
Monilial Vaginitis  Vaginitis in a soreness, swelling and redness (inflammation) of the vagina and vulva. Monilial vaginitis is not a sexually transmitted infection.  CAUSES   Yeast vaginitis is caused by yeast (candida) that is normally found in your vagina. With a yeast infection, the candida has overgrown in number to a point that upsets the chemical balance.  SYMPTOMS   · White, thick vaginal discharge.  · Swelling, itching, redness and irritation of the vagina and possibly the lips of the vagina (vulva).  · Burning or painful urination.  · Painful intercourse.  DIAGNOSIS   Things that may contribute to monilial vaginitis are:  · Postmenopausal and virginal states.  · Pregnancy.  · Infections.  · Being tired, sick or stressed, especially if you had monilial vaginitis in the past.  · Diabetes. Good control will help lower the chance.  · Birth control pills.  · Tight fitting garments.  · Using bubble bath, feminine sprays, douches or deodorant tampons.  · Taking certain medications that kill germs (antibiotics).  · Sporadic recurrence can occur if you become ill.  TREATMENT   Your caregiver will give you medication.  · There are several kinds of anti monilial vaginal creams and suppositories specific for monilial vaginitis. For recurrent yeast infections, use a suppository or cream in the vagina 2 times a week, or as directed.  · Anti-monilial or steroid cream for the itching or irritation of the vulva may also be used. Get your caregiver's permission.  · Painting the vagina with methylene blue solution may help if the monilial cream does not work.  · Eating yogurt may help prevent monilial vaginitis.  HOME CARE INSTRUCTIONS   · Finish all medication as prescribed.  · Do not have sex until treatment is completed or after your caregiver tells you it is okay.  · Take warm sitz baths.  · Do not douche.  · Do not use tampons, especially scented ones.  · Wear cotton underwear.  · Avoid tight pants and panty  hose.  · Tell your sexual partner that you have a yeast infection. They should go to their caregiver if they have symptoms such as mild rash or itching.  · Your sexual partner should be treated as well if your infection is difficult to eliminate.  · Practice safer sex. Use condoms.  · Some vaginal medications cause latex condoms to fail. Vaginal medications that harm condoms are:  · Cleocin cream.  · Butoconazole (Femstat®).  · Terconazole (Terazol®) vaginal suppository.  · Miconazole (Monistat®) (may be purchased over the counter).  SEEK MEDICAL CARE IF:   · You have a temperature by mouth above 102° F (38.9° C).  · The infection is getting worse after 2 days of treatment.  · The infection is not getting better after 3 days of treatment.  · You develop blisters in or around your vagina.  · You develop vaginal bleeding, and it is not your menstrual period.  · You have pain when you urinate.  · You develop intestinal problems.  · You have pain with sexual intercourse.  Document Released: 09/10/2005 Document Revised: 02/23/2012 Document Reviewed: 05/25/2009  ExitCare® Patient Information ©2014 [employer].

## 2013-06-03 NOTE — Progress Notes (Signed)
Patient ID: Teresa Morales, female   DOB: [birth year], 72 y.o.   MRN: [id number]  Subjective  Having vaginal irritation and burning.  No lesions. A little bit of discharge. Had Diflucan at home.  Took 2, three days apart. Got better but a couple of days ago burning and irritation, more than usual returned.   Has been using Mycolog II cream which helps. So, symptoms are now better.  Wanted to be seen before the weekend. Has had problems with incomplete treatment of yeast infections with Diflucan. Terazol has been successful in past.  Uses Estrace three times a week. No new exposures. Has been gardening.  Denies poison ivy.  Objective  Pelvic exam Vulva with mild erythema of the labia minora bilaterally. Vagina and cervix without lesions. No significant discharge noted. Small and nontender uterus. No adnexal masses or tenderness.  Wet prep - pH 4.0, no clue cells, no trichomonas, no hyphae.  Assessment  Probable recent yeast infection.    Plan  Education about risk factors for different types of vaginitis. Rx for patient to use Terazol 3 if symptoms increase.  See Epic orders. Discussed probiotics which the patient is already taking.

## 2013-06-04 ENCOUNTER — Encounter: Payer: Self-pay | Admitting: Obstetrics and Gynecology

## 2013-06-13 DIAGNOSIS — J309 Allergic rhinitis, unspecified: Secondary | ICD-10-CM | POA: Diagnosis not present

## 2013-06-21 DIAGNOSIS — T6391XA Toxic effect of contact with unspecified venomous animal, accidental (unintentional), initial encounter: Secondary | ICD-10-CM | POA: Diagnosis not present

## 2013-06-24 DIAGNOSIS — E559 Vitamin D deficiency, unspecified: Secondary | ICD-10-CM | POA: Diagnosis not present

## 2013-06-24 DIAGNOSIS — M2550 Pain in unspecified joint: Secondary | ICD-10-CM | POA: Diagnosis not present

## 2013-06-24 DIAGNOSIS — G4700 Insomnia, unspecified: Secondary | ICD-10-CM | POA: Diagnosis not present

## 2013-06-24 DIAGNOSIS — K219 Gastro-esophageal reflux disease without esophagitis: Secondary | ICD-10-CM | POA: Diagnosis not present

## 2013-06-24 DIAGNOSIS — Z79899 Other long term (current) drug therapy: Secondary | ICD-10-CM | POA: Diagnosis not present

## 2013-06-24 DIAGNOSIS — Z0000 Encounter for general adult medical examination without abnormal findings: Secondary | ICD-10-CM | POA: Diagnosis not present

## 2013-06-24 DIAGNOSIS — Z1331 Encounter for screening for depression: Secondary | ICD-10-CM | POA: Diagnosis not present

## 2013-06-24 DIAGNOSIS — I10 Essential (primary) hypertension: Secondary | ICD-10-CM | POA: Diagnosis not present

## 2013-06-24 DIAGNOSIS — N816 Rectocele: Secondary | ICD-10-CM | POA: Diagnosis not present

## 2013-06-24 DIAGNOSIS — N8111 Cystocele, midline: Secondary | ICD-10-CM | POA: Diagnosis not present

## 2013-06-27 DIAGNOSIS — J309 Allergic rhinitis, unspecified: Secondary | ICD-10-CM | POA: Diagnosis not present

## 2013-07-04 DIAGNOSIS — J309 Allergic rhinitis, unspecified: Secondary | ICD-10-CM | POA: Diagnosis not present

## 2013-07-06 DIAGNOSIS — T6391XA Toxic effect of contact with unspecified venomous animal, accidental (unintentional), initial encounter: Secondary | ICD-10-CM | POA: Diagnosis not present

## 2013-07-15 DIAGNOSIS — J309 Allergic rhinitis, unspecified: Secondary | ICD-10-CM | POA: Diagnosis not present

## 2013-07-21 DIAGNOSIS — J309 Allergic rhinitis, unspecified: Secondary | ICD-10-CM | POA: Diagnosis not present

## 2013-07-29 DIAGNOSIS — M76899 Other specified enthesopathies of unspecified lower limb, excluding foot: Secondary | ICD-10-CM | POA: Diagnosis not present

## 2013-08-01 DIAGNOSIS — J309 Allergic rhinitis, unspecified: Secondary | ICD-10-CM | POA: Diagnosis not present

## 2013-08-08 DIAGNOSIS — T6391XA Toxic effect of contact with unspecified venomous animal, accidental (unintentional), initial encounter: Secondary | ICD-10-CM | POA: Diagnosis not present

## 2013-08-16 DIAGNOSIS — J309 Allergic rhinitis, unspecified: Secondary | ICD-10-CM | POA: Diagnosis not present

## 2013-08-16 DIAGNOSIS — E871 Hypo-osmolality and hyponatremia: Secondary | ICD-10-CM | POA: Diagnosis not present

## 2013-08-16 DIAGNOSIS — I10 Essential (primary) hypertension: Secondary | ICD-10-CM | POA: Diagnosis not present

## 2013-08-22 DIAGNOSIS — J45909 Unspecified asthma, uncomplicated: Secondary | ICD-10-CM | POA: Diagnosis not present

## 2013-08-22 DIAGNOSIS — J309 Allergic rhinitis, unspecified: Secondary | ICD-10-CM | POA: Diagnosis not present

## 2013-08-29 DIAGNOSIS — J45909 Unspecified asthma, uncomplicated: Secondary | ICD-10-CM | POA: Diagnosis not present

## 2013-08-29 DIAGNOSIS — J309 Allergic rhinitis, unspecified: Secondary | ICD-10-CM | POA: Diagnosis not present

## 2013-09-05 DIAGNOSIS — T6391XA Toxic effect of contact with unspecified venomous animal, accidental (unintentional), initial encounter: Secondary | ICD-10-CM | POA: Diagnosis not present

## 2013-09-09 DIAGNOSIS — J309 Allergic rhinitis, unspecified: Secondary | ICD-10-CM | POA: Diagnosis not present

## 2013-09-19 DIAGNOSIS — J309 Allergic rhinitis, unspecified: Secondary | ICD-10-CM | POA: Diagnosis not present

## 2013-09-23 DIAGNOSIS — Z23 Encounter for immunization: Secondary | ICD-10-CM | POA: Diagnosis not present

## 2013-09-29 DIAGNOSIS — J309 Allergic rhinitis, unspecified: Secondary | ICD-10-CM | POA: Diagnosis not present

## 2013-10-03 DIAGNOSIS — T6391XA Toxic effect of contact with unspecified venomous animal, accidental (unintentional), initial encounter: Secondary | ICD-10-CM | POA: Diagnosis not present

## 2013-10-07 ENCOUNTER — Encounter: Payer: Self-pay | Admitting: Nurse Practitioner

## 2013-10-07 ENCOUNTER — Ambulatory Visit (INDEPENDENT_AMBULATORY_CARE_PROVIDER_SITE_OTHER): Payer: Medicare Other | Admitting: Nurse Practitioner

## 2013-10-07 VITALS — BP 144/92 | HR 68 | Ht 63.0 in | Wt 121.0 lb

## 2013-10-07 DIAGNOSIS — B373 Candidiasis of vulva and vagina: Secondary | ICD-10-CM | POA: Diagnosis not present

## 2013-10-07 MED ORDER — FLUCONAZOLE 150 MG PO TABS: 150.0000 mg | ORAL_TABLET | Freq: Once | ORAL | Status: DC

## 2013-10-07 MED ORDER — TERCONAZOLE 0.8 % VA CREA: 1.0000 | TOPICAL_CREAM | Freq: Every day | VAGINAL | Status: DC

## 2013-10-07 MED ORDER — NYSTATIN-TRIAMCINOLONE 100000-0.1 UNIT/GM-% EX OINT: TOPICAL_OINTMENT | Freq: Two times a day (BID) | CUTANEOUS | Status: DC

## 2013-10-07 NOTE — Patient Instructions (Signed)
Monilial Vaginitis  Vaginitis in a soreness, swelling and redness (inflammation) of the vagina and vulva. Monilial vaginitis is not a sexually transmitted infection.  CAUSES   Yeast vaginitis is caused by yeast (candida) that is normally found in your vagina. With a yeast infection, the candida has overgrown in number to a point that upsets the chemical balance.  SYMPTOMS   · White, thick vaginal discharge.  · Swelling, itching, redness and irritation of the vagina and possibly the lips of the vagina (vulva).  · Burning or painful urination.  · Painful intercourse.  DIAGNOSIS   Things that may contribute to monilial vaginitis are:  · Postmenopausal and virginal states.  · Pregnancy.  · Infections.  · Being tired, sick or stressed, especially if you had monilial vaginitis in the past.  · Diabetes. Good control will help lower the chance.  · Birth control pills.  · Tight fitting garments.  · Using bubble bath, feminine sprays, douches or deodorant tampons.  · Taking certain medications that kill germs (antibiotics).  · Sporadic recurrence can occur if you become ill.  TREATMENT   Your caregiver will give you medication.  · There are several kinds of anti monilial vaginal creams and suppositories specific for monilial vaginitis. For recurrent yeast infections, use a suppository or cream in the vagina 2 times a week, or as directed.  · Anti-monilial or steroid cream for the itching or irritation of the vulva may also be used. Get your caregiver's permission.  · Painting the vagina with methylene blue solution may help if the monilial cream does not work.  · Eating yogurt may help prevent monilial vaginitis.  HOME CARE INSTRUCTIONS   · Finish all medication as prescribed.  · Do not have sex until treatment is completed or after your caregiver tells you it is okay.  · Take warm sitz baths.  · Do not douche.  · Do not use tampons, especially scented ones.  · Wear cotton underwear.  · Avoid tight pants and panty  hose.  · Tell your sexual partner that you have a yeast infection. They should go to their caregiver if they have symptoms such as mild rash or itching.  · Your sexual partner should be treated as well if your infection is difficult to eliminate.  · Practice safer sex. Use condoms.  · Some vaginal medications cause latex condoms to fail. Vaginal medications that harm condoms are:  · Cleocin cream.  · Butoconazole (Femstat®).  · Terconazole (Terazol®) vaginal suppository.  · Miconazole (Monistat®) (may be purchased over the counter).  SEEK MEDICAL CARE IF:   · You have a temperature by mouth above 102° F (38.9° C).  · The infection is getting worse after 2 days of treatment.  · The infection is not getting better after 3 days of treatment.  · You develop blisters in or around your vagina.  · You develop vaginal bleeding, and it is not your menstrual period.  · You have pain when you urinate.  · You develop intestinal problems.  · You have pain with sexual intercourse.  Document Released: 09/10/2005 Document Revised: 02/23/2012 Document Reviewed: 05/25/2009  ExitCare® Patient Information ©2014 [employer].

## 2013-10-07 NOTE — Progress Notes (Signed)
Subjective:     Patient ID: Teresa Morales, female   DOB: 07/04/1941, 72 y.o.   MRN: [id number]  HPI  This 72 yo W Fe presents with yeast vaginitis. She had similar symptoms in June which improved with Diflucan and Terazol.  These symptoms seem to be worse since Monday.  Has used nystatin cream with some relief until last pm when she had increased itching. She is using her Estrace vaginal cream 1/2 gm 2-3 times weekly.  Denies urinary symptoms.   She also desires a consult visit about Vit D and Prolia.   Review of Systems  Constitutional: Negative for fever, chills and fatigue.  Respiratory: Negative.   Cardiovascular: Negative.   Gastrointestinal: Negative for nausea and vomiting.  Genitourinary: Positive for vaginal discharge. Negative for dysuria, urgency, frequency, flank pain and pelvic pain.  Musculoskeletal: Negative.   Skin: Negative.        Objective:   Physical Exam  Constitutional: She is oriented to person, place, and time. She appears well-developed and well-nourished. No distress.  Abdominal: Soft. She exhibits no distension. There is no tenderness. There is no rebound and no guarding.  Genitourinary:  External labia is red with irritation extending to perianal area. Small linear cuts consistent with yeast.  Small amount of vaginal discharge.  No pain on bimanual at the bladder.  Wet Prep: ph: 4.5; NSS: negative; KOH: + yeast.  Neurological: She is alert and oriented to person, place, and time.  Psychiatric: She has a normal mood and affect. Her behavior is normal. Judgment and thought content normal.       Assessment:     Yeast Vulvovaginitis   Osteopenia - borderline osteoporosis  Past use of Actonel and Boniva with significant muscle aches Plan:     Diflucan 150 mg X 2 doses along with Terazol vaginal cream.  She is going out of town in mid November and wants a back up dose if needed while away. Continue to monitor and if symptoms worsens to call back  She also  wanted to discuss her Vit D results at 71.8 - she is advised to reduce OTC Vit D 3 from 2000 IU daily to 1000 IU daily.  We also discussed her last BMD and she is considering Prolia as suggested but wants to speak to a specialist about all options.  I gave her Dr. Willeen Cass name and she might consider that.

## 2013-10-09 NOTE — Progress Notes (Signed)
Encounter reviewed by Dr. Brook Silva.  

## 2013-10-11 ENCOUNTER — Ambulatory Visit: Payer: Medicare Other | Admitting: Nurse Practitioner

## 2013-10-11 ENCOUNTER — Telehealth: Payer: Self-pay | Admitting: Emergency Medicine

## 2013-10-11 DIAGNOSIS — J309 Allergic rhinitis, unspecified: Secondary | ICD-10-CM | POA: Diagnosis not present

## 2013-10-11 NOTE — Telephone Encounter (Signed)
Spoke with patient. Asked to call by Lauro Franklin, FNP.  She took Diflucan Friday and started [employer] 3 day on Friday/Sat/Sun. Then took #2 Diflucan.  Patient states that itching has resolved slightly but still having external irritation. Per Patty, patient was advised that she will need to give it 3-4 more days to see if medication has taken effect. Patient is agreeable. Advised to limit itching of area, and use aveeno sitz baths, patient verbalized understanding and will call back if not improved in 3 days.   Patty, patient is requesting a 7 day treatment of terazole to have while she is out of town just in case these symptoms return. Can you order?

## 2013-10-19 NOTE — Telephone Encounter (Signed)
Patty can you review and advise? Okay to order patient 7 day treatment terazole to have while she is out of town?

## 2013-10-20 MED ORDER — TERCONAZOLE 0.4 % VA CREA: 1.0000 | TOPICAL_CREAM | Freq: Every day | VAGINAL | Status: DC

## 2013-10-20 NOTE — Telephone Encounter (Signed)
Patient notified treatment sent to [facility] per her request. She states she is feeling much better!

## 2013-10-21 DIAGNOSIS — T6391XA Toxic effect of contact with unspecified venomous animal, accidental (unintentional), initial encounter: Secondary | ICD-10-CM | POA: Diagnosis not present

## 2013-10-27 DIAGNOSIS — J309 Allergic rhinitis, unspecified: Secondary | ICD-10-CM | POA: Diagnosis not present

## 2013-11-08 DIAGNOSIS — J309 Allergic rhinitis, unspecified: Secondary | ICD-10-CM | POA: Diagnosis not present

## 2013-11-16 DIAGNOSIS — J309 Allergic rhinitis, unspecified: Secondary | ICD-10-CM | POA: Diagnosis not present

## 2013-11-16 DIAGNOSIS — E871 Hypo-osmolality and hyponatremia: Secondary | ICD-10-CM | POA: Diagnosis not present

## 2013-11-16 DIAGNOSIS — I10 Essential (primary) hypertension: Secondary | ICD-10-CM | POA: Diagnosis not present

## 2013-11-16 DIAGNOSIS — T6391XA Toxic effect of contact with unspecified venomous animal, accidental (unintentional), initial encounter: Secondary | ICD-10-CM | POA: Diagnosis not present

## 2013-11-21 DIAGNOSIS — J309 Allergic rhinitis, unspecified: Secondary | ICD-10-CM | POA: Diagnosis not present

## 2013-11-23 DIAGNOSIS — L723 Sebaceous cyst: Secondary | ICD-10-CM | POA: Diagnosis not present

## 2013-11-23 DIAGNOSIS — Z85828 Personal history of other malignant neoplasm of skin: Secondary | ICD-10-CM | POA: Diagnosis not present

## 2013-11-23 DIAGNOSIS — L821 Other seborrheic keratosis: Secondary | ICD-10-CM | POA: Diagnosis not present

## 2013-11-23 DIAGNOSIS — L819 Disorder of pigmentation, unspecified: Secondary | ICD-10-CM | POA: Diagnosis not present

## 2013-11-23 DIAGNOSIS — B351 Tinea unguium: Secondary | ICD-10-CM | POA: Diagnosis not present

## 2013-11-23 DIAGNOSIS — Z85820 Personal history of malignant melanoma of skin: Secondary | ICD-10-CM | POA: Diagnosis not present

## 2013-11-23 DIAGNOSIS — D1801 Hemangioma of skin and subcutaneous tissue: Secondary | ICD-10-CM | POA: Diagnosis not present

## 2013-11-23 DIAGNOSIS — D239 Other benign neoplasm of skin, unspecified: Secondary | ICD-10-CM | POA: Diagnosis not present

## 2013-11-25 DIAGNOSIS — J309 Allergic rhinitis, unspecified: Secondary | ICD-10-CM | POA: Diagnosis not present

## 2013-12-02 DIAGNOSIS — J309 Allergic rhinitis, unspecified: Secondary | ICD-10-CM | POA: Diagnosis not present

## 2013-12-12 DIAGNOSIS — J309 Allergic rhinitis, unspecified: Secondary | ICD-10-CM | POA: Diagnosis not present

## 2013-12-19 DIAGNOSIS — T6391XA Toxic effect of contact with unspecified venomous animal, accidental (unintentional), initial encounter: Secondary | ICD-10-CM | POA: Diagnosis not present

## 2013-12-26 DIAGNOSIS — J309 Allergic rhinitis, unspecified: Secondary | ICD-10-CM | POA: Diagnosis not present

## 2014-01-05 DIAGNOSIS — M47817 Spondylosis without myelopathy or radiculopathy, lumbosacral region: Secondary | ICD-10-CM | POA: Diagnosis not present

## 2014-01-05 DIAGNOSIS — J309 Allergic rhinitis, unspecified: Secondary | ICD-10-CM | POA: Diagnosis not present

## 2014-01-11 ENCOUNTER — Ambulatory Visit: Payer: Medicare Other | Attending: Sports Medicine | Admitting: Physical Therapy

## 2014-01-11 DIAGNOSIS — M25559 Pain in unspecified hip: Secondary | ICD-10-CM | POA: Diagnosis not present

## 2014-01-11 DIAGNOSIS — M545 Low back pain, unspecified: Secondary | ICD-10-CM | POA: Diagnosis not present

## 2014-01-11 DIAGNOSIS — I10 Essential (primary) hypertension: Secondary | ICD-10-CM | POA: Diagnosis not present

## 2014-01-11 DIAGNOSIS — IMO0001 Reserved for inherently not codable concepts without codable children: Secondary | ICD-10-CM | POA: Diagnosis not present

## 2014-01-11 DIAGNOSIS — J309 Allergic rhinitis, unspecified: Secondary | ICD-10-CM | POA: Diagnosis not present

## 2014-01-17 ENCOUNTER — Ambulatory Visit: Payer: Medicare Other | Attending: Sports Medicine | Admitting: Physical Therapy

## 2014-01-17 DIAGNOSIS — IMO0001 Reserved for inherently not codable concepts without codable children: Secondary | ICD-10-CM | POA: Diagnosis not present

## 2014-01-17 DIAGNOSIS — M545 Low back pain, unspecified: Secondary | ICD-10-CM | POA: Diagnosis not present

## 2014-01-17 DIAGNOSIS — I10 Essential (primary) hypertension: Secondary | ICD-10-CM | POA: Insufficient documentation

## 2014-01-17 DIAGNOSIS — M25559 Pain in unspecified hip: Secondary | ICD-10-CM | POA: Insufficient documentation

## 2014-01-18 DIAGNOSIS — T6391XA Toxic effect of contact with unspecified venomous animal, accidental (unintentional), initial encounter: Secondary | ICD-10-CM | POA: Diagnosis not present

## 2014-01-19 ENCOUNTER — Ambulatory Visit: Payer: Medicare Other | Admitting: Physical Therapy

## 2014-01-24 ENCOUNTER — Ambulatory Visit: Payer: Medicare Other | Admitting: Physical Therapy

## 2014-01-26 ENCOUNTER — Encounter: Payer: Medicare Other | Admitting: Physical Therapy

## 2014-01-27 ENCOUNTER — Ambulatory Visit: Payer: Medicare Other | Admitting: Physical Therapy

## 2014-01-27 DIAGNOSIS — J309 Allergic rhinitis, unspecified: Secondary | ICD-10-CM | POA: Diagnosis not present

## 2014-01-31 ENCOUNTER — Ambulatory Visit: Payer: Medicare Other | Admitting: Physical Therapy

## 2014-02-02 ENCOUNTER — Ambulatory Visit: Payer: Medicare Other | Admitting: Physical Therapy

## 2014-02-03 DIAGNOSIS — J309 Allergic rhinitis, unspecified: Secondary | ICD-10-CM | POA: Diagnosis not present

## 2014-02-06 DIAGNOSIS — H1130 Conjunctival hemorrhage, unspecified eye: Secondary | ICD-10-CM | POA: Diagnosis not present

## 2014-02-09 ENCOUNTER — Ambulatory Visit: Payer: Medicare Other | Admitting: Physical Therapy

## 2014-02-10 ENCOUNTER — Ambulatory Visit: Payer: Medicare Other | Admitting: Physical Therapy

## 2014-02-10 DIAGNOSIS — J309 Allergic rhinitis, unspecified: Secondary | ICD-10-CM | POA: Diagnosis not present

## 2014-02-13 ENCOUNTER — Ambulatory Visit: Payer: Self-pay | Admitting: Obstetrics and Gynecology

## 2014-02-13 ENCOUNTER — Ambulatory Visit: Payer: Self-pay | Admitting: Gynecology

## 2014-02-14 ENCOUNTER — Ambulatory Visit: Payer: Medicare Other | Attending: Sports Medicine | Admitting: Physical Therapy

## 2014-02-14 DIAGNOSIS — IMO0001 Reserved for inherently not codable concepts without codable children: Secondary | ICD-10-CM | POA: Insufficient documentation

## 2014-02-14 DIAGNOSIS — M25559 Pain in unspecified hip: Secondary | ICD-10-CM | POA: Diagnosis not present

## 2014-02-14 DIAGNOSIS — M545 Low back pain, unspecified: Secondary | ICD-10-CM | POA: Insufficient documentation

## 2014-02-14 DIAGNOSIS — I10 Essential (primary) hypertension: Secondary | ICD-10-CM | POA: Insufficient documentation

## 2014-02-16 ENCOUNTER — Ambulatory Visit (INDEPENDENT_AMBULATORY_CARE_PROVIDER_SITE_OTHER): Payer: Medicare Other | Admitting: Obstetrics and Gynecology

## 2014-02-16 ENCOUNTER — Encounter: Payer: Self-pay | Admitting: Obstetrics and Gynecology

## 2014-02-16 VITALS — BP 124/76 | HR 81 | Resp 14 | Ht 63.0 in | Wt 121.0 lb

## 2014-02-16 DIAGNOSIS — Z01419 Encounter for gynecological examination (general) (routine) without abnormal findings: Secondary | ICD-10-CM

## 2014-02-16 DIAGNOSIS — Z124 Encounter for screening for malignant neoplasm of cervix: Secondary | ICD-10-CM

## 2014-02-16 DIAGNOSIS — M899 Disorder of bone, unspecified: Secondary | ICD-10-CM

## 2014-02-16 DIAGNOSIS — M8580 Other specified disorders of bone density and structure, unspecified site: Secondary | ICD-10-CM

## 2014-02-16 DIAGNOSIS — M949 Disorder of cartilage, unspecified: Secondary | ICD-10-CM

## 2014-02-16 MED ORDER — ESTRADIOL 0.1 MG/GM VA CREA: TOPICAL_CREAM | VAGINAL | Status: DC

## 2014-02-16 NOTE — Patient Instructions (Signed)
EXERCISE AND DIET:  We recommended that you start or continue a regular exercise program for good health. Regular exercise means any activity that makes your heart beat faster and makes you sweat.  We recommend exercising at least 30 minutes per day at least 3 days a week, preferably 4 or 5.  We also recommend a diet low in fat and sugar.  Inactivity, poor dietary choices and obesity can cause diabetes, heart attack, stroke, and kidney damage, among others.    ALCOHOL AND SMOKING:  Women should limit their alcohol intake to no more than 7 drinks/beers/glasses of wine (combined, not each!) per week. Moderation of alcohol intake to this level decreases your risk of breast cancer and liver damage. And of course, no recreational drugs are part of a healthy lifestyle.  And absolutely no smoking or even second hand smoke. Most people know smoking can cause heart and lung diseases, but did you know it also contributes to weakening of your bones? Aging of your skin?  Yellowing of your teeth and nails?  CALCIUM AND VITAMIN D:  Adequate intake of calcium and Vitamin D are recommended.  The recommendations for exact amounts of these supplements seem to change often, but generally speaking 600 mg of calcium (either carbonate or citrate) and 800 units of Vitamin D per day seems prudent. Certain women may benefit from higher intake of Vitamin D.  If you are among these women, your doctor will have told you during your visit.    PAP SMEARS:  Pap smears, to check for cervical cancer or precancers,  have traditionally been done yearly, although recent scientific advances have shown that most women can have pap smears less often.  However, every woman still should have a physical exam from her gynecologist every year. It will include a breast check, inspection of the vulva and vagina to check for abnormal growths or skin changes, a visual exam of the cervix, and then an exam to evaluate the size and shape of the uterus and  ovaries.  And after 73 years of age, a rectal exam is indicated to check for rectal cancers. We will also provide age appropriate advice regarding health maintenance, like when you should have certain vaccines, screening for sexually transmitted diseases, bone density testing, colonoscopy, mammograms, etc.   MAMMOGRAMS:  All women over 40 years old should have a yearly mammogram. Many facilities now offer a "3D" mammogram, which may cost around $50 extra out of pocket. If possible,  we recommend you accept the option to have the 3D mammogram performed.  It both reduces the number of women who will be called back for extra views which then turn out to be normal, and it is better than the routine mammogram at detecting truly abnormal areas.    COLONOSCOPY:  Colonoscopy to screen for colon cancer is recommended for all women at age 50.  We know, you hate the idea of the prep.  We agree, BUT, having colon cancer and not knowing it is worse!!  Colon cancer so often starts as a polyp that can be seen and removed at colonscopy, which can quite literally save your life!  And if your first colonoscopy is normal and you have no family history of colon cancer, most women don't have to have it again for 10 years.  Once every ten years, you can do something that may end up saving your life, right?  We will be happy to help you get it scheduled when you are ready.    Be sure to check your insurance coverage so you understand how much it will cost.  It may be covered as a preventative service at no cost, but you should check your particular policy.     Denosumab injection What is this medicine? DENOSUMAB (den oh sue mab) slows bone breakdown. Prolia is used to treat osteoporosis in women after menopause and in men. Delton See is used to prevent bone fractures and other bone problems caused by cancer bone metastases. Delton See is also used to treat giant cell tumor of the bone. This medicine may be used for other purposes; ask your  health care provider or pharmacist if you have questions. COMMON BRAND NAME(S): Prolia, XGEVA What should I tell my health care provider before I take this medicine? They need to know if you have any of these conditions: -dental disease -eczema -infection or history of infections -kidney disease or on dialysis -low blood calcium or vitamin D -malabsorption syndrome -scheduled to have surgery or tooth extraction -taking medicine that contains denosumab -thyroid or parathyroid disease -an unusual reaction to denosumab, other medicines, foods, dyes, or preservatives -pregnant or trying to get pregnant -breast-feeding How should I use this medicine? This medicine is for injection under the skin. It is given by a health care professional in a hospital or clinic setting. If you are getting Prolia, a special MedGuide will be given to you by the pharmacist with each prescription and refill. Be sure to read this information carefully each time. For Prolia, talk to your pediatrician regarding the use of this medicine in children. Special care may be needed. For Delton See, talk to your pediatrician regarding the use of this medicine in children. While this drug may be prescribed for children as young as 13 years for selected conditions, precautions do apply. Overdosage: If you think you've taken too much of this medicine contact a poison control center or emergency room at once. Overdosage: If you think you have taken too much of this medicine contact a poison control center or emergency room at once. NOTE: This medicine is only for you. Do not share this medicine with others. What if I miss a dose? It is important not to miss your dose. Call your doctor or health care professional if you are unable to keep an appointment. What may interact with this medicine? Do not take this medicine with any of the following medications: -other medicines containing denosumab This medicine may also interact with the  following medications: -medicines that suppress the immune system -medicines that treat cancer -steroid medicines like prednisone or cortisone This list may not describe all possible interactions. Give your health care provider a list of all the medicines, herbs, non-prescription drugs, or dietary supplements you use. Also tell them if you smoke, drink alcohol, or use illegal drugs. Some items may interact with your medicine. What should I watch for while using this medicine? Visit your doctor or health care professional for regular checks on your progress. Your doctor or health care professional may order blood tests and other tests to see how you are doing. Call your doctor or health care professional if you get a cold or other infection while receiving this medicine. Do not treat yourself. This medicine may decrease your body's ability to fight infection. You should make sure you get enough calcium and vitamin D while you are taking this medicine, unless your doctor tells you not to. Discuss the foods you eat and the vitamins you take with your health care professional. See  your dentist regularly. Brush and floss your teeth as directed. Before you have any dental work done, tell your dentist you are receiving this medicine. Do not become pregnant while taking this medicine or for 5 months after stopping it. Women should inform their doctor if they wish to become pregnant or think they might be pregnant. There is a potential for serious side effects to an unborn child. Talk to your health care professional or pharmacist for more information. What side effects may I notice from receiving this medicine? Side effects that you should report to your doctor or health care professional as soon as possible: -allergic reactions like skin rash, itching or hives, swelling of the face, lips, or tongue -breathing problems -chest pain -fast, irregular heartbeat -feeling faint or lightheaded, falls -fever,  chills, or any other sign of infection -muscle spasms, tightening, or twitches -numbness or tingling -skin blisters or bumps, or is dry, peels, or red -slow healing or unexplained pain in the mouth or jaw -unusual bleeding or bruising Side effects that usually do not require medical attention (Report these to your doctor or health care professional if they continue or are bothersome.): -muscle pain -stomach upset, gas This list may not describe all possible side effects. Call your doctor for medical advice about side effects. You may report side effects to FDA at 1-800-FDA-1088. Where should I keep my medicine? This medicine is only given in a clinic, doctor's office, or other health care setting and will not be stored at home. NOTE: This sheet is a summary. It may not cover all possible information. If you have questions about this medicine, talk to your doctor, pharmacist, or health care provider.  2014, Elsevier/Gold Standard. (2012-05-31 12:37:47)  Raloxifene tablets What is this medicine? RALOXIFENE (ral OX i feen) reduces the amount of calcium lost from bones. It is used to treat and prevent osteoporosis in women who have experienced menopause. This medicine may be used for other purposes; ask your health care provider or pharmacist if you have questions. COMMON BRAND NAME(S): Evista What should I tell my health care provider before I take this medicine? They need to know if you have any of these conditions: -a history of blood clots -cancer -heart failure -liver disease -premenopausal -an unusual or allergic reaction to raloxifene, other medicines, foods, dyes, or preservatives -pregnant or trying to get pregnant -breast-feeding How should I use this medicine? Take this medicine by mouth with a glass of water. Follow the directions on the prescription label. The tablets can be taken with or without food. Take your doses at regular intervals. Do not take your medicine more often  than directed. Talk to your pediatrician regarding the use of this medicine in children. Special care may be needed. Overdosage: If you think you have taken too much of this medicine contact a poison control center or emergency room at once. NOTE: This medicine is only for you. Do not share this medicine with others. What if I miss a dose? If you miss a dose, take it as soon as you can. If it is almost time for your next dose, take only that dose. Do not take double or extra doses. What may interact with this medicine? -ampicillin -cholestyramine -colestipol -diazepam -diazoxide -female hormones like hormone replacement therapy -lidocaine -warfarin This list may not describe all possible interactions. Give your health care provider a list of all the medicines, herbs, non-prescription drugs, or dietary supplements you use. Also tell them if you smoke, drink alcohol, or  use illegal drugs. Some items may interact with your medicine. What should I watch for while using this medicine? Visit your doctor or health care professional for regular checks on your progress. Do not stop taking this medicine except on the advice of your doctor or health care professional. You should make sure you get enough calcium and vitamin D in your diet while you are taking this medicine. Discuss your dietary needs with your health care professional or nutritionist. Exercise may help to prevent bone loss. Discuss your exercise needs with your doctor or health care professional. This medicine can rarely cause blood clots. You should avoid long periods of bed rest while taking this medicine. If you are going to have surgery, tell your doctor or health care professional that you are taking this medicine. This medicine should be stopped at least 3 days before surgery. After surgery, it should be restarted only after you are walking again. It should not be restarted while you still need long periods of bed rest. You should not  smoke while taking this medicine. Smoking may also increase your risk of blood clots. Smoking can also decrease the effects of this medicine. This medicine does not prevent hot flashes. It may cause hot flashes in some patients at the start of therapy. What side effects may I notice from receiving this medicine? Side effects that you should report to your doctor or health care professional as soon as possible: -change in vision -chest pain -difficulty breathing -leg pain or swelling -skin rash, itching Side effects that usually do not require medical attention (report to your doctor or health care professional if they continue or are bothersome): -fluid build-up -leg cramps -stomach pain -sweating This list may not describe all possible side effects. Call your doctor for medical advice about side effects. You may report side effects to FDA at 1-800-FDA-1088. Where should I keep my medicine? Keep out of the reach of children. Store at room temperature between 15 and 30 degrees C (59 and 86 degrees F). Throw away any unused medicine after the expiration date. NOTE: This sheet is a summary. It may not cover all possible information. If you have questions about this medicine, talk to your doctor, pharmacist, or health care provider.  2014, Elsevier/Gold Standard. (2008-11-16 15:15:14)

## 2014-02-16 NOTE — Progress Notes (Signed)
GYNECOLOGY VISIT  PCP: Henrine Screws  Referring provider:   HPI: 73 y.o.   Married  Caucasian  female   G1P1001 with No LMP recorded. Patient is postmenopausal.   here for   Annual Exam  Having low back problems. Disc problems.  Also has hypermobile SI joint.  Has done physical therapy.  Doing also some weight bearing exercises.  Saw [facility].  Also starting low impact aerobics.    Asking to have comprehensive discussion her bone density report from 2014. Has osteopenia and has intolerance of bisphosphonates.  Had reflux with Fosamax.   Had joint aching with Actonel. Boniva caused joint aching.  Told to consider Prolia last year.   Will measure Vit D with Dr. Inda Merlin in June 2015.   Wants refill of her vaginal estrogen.  Hgb:  PCP Urine:  PCP  GYNECOLOGIC HISTORY: No LMP recorded. Patient is postmenopausal. Sexually active: yes  Partner preference: female Contraception:  Menopausal  Menopausal hormone therapy: Estrace Cream DES exposure:   no Blood transfusions: no   Sexually transmitted diseases:no    GYN procedures and prior surgeries:  11/14/99 Endometrial Bx, Benign Last mammogram:   02/02/13  Neg              Last pap and high risk HPV testing:   12/25/08 Neg. History of AGUS pap and a polyp was diagnosed.  History of abnormal pap smear:  Yes  6/89  Laser vaporization of Cervix   OB History   Grav Para Term Preterm Abortions TAB SAB Ect Mult Living   1 1 1       1        LIFESTYLE: Exercise:    Walking, low impact aerobics, weight bearing exercises           Tobacco: no Alcohol: no Drug use:  no  OTHER HEALTH MAINTENANCE: Tetanus/TDap: Will check with PCP (not sure) Gardisil: no Influenza:  yes Zostavax: yes  Bone density: 02/02/13 Osteopenia - T score of left hip -2.4, T score of spine -1.7 Colonoscopy: around 2012, repeat in 5 years  Benign polyp  Cholesterol check: 06/24/2013  Total 185   LDL 90   HDL 73   TRIG   68  Family  History  Problem Relation Age of Onset  . Other Mother 90    MVA   . Lupus Sister 30  . Prostate cancer Brother   . Hypertension Father   . Osteoporosis Sister   . Thyroid disease Sister   . Scoliosis Sister     There are no active problems to display for this patient.  Past Medical History  Diagnosis Date  . Osteopenia     intolerant of bisphosphonates  . SIADH (syndrome of inappropriate ADH production) 1998  . Endocervical polyp 1997  . Urethral stenosis     dilated  . Mild dysplasia of cervix     laser  . 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 Interstitial cystitis 2004  . GERD (gastroesophageal reflux disease)   . Melanoma of face 04/2007  . Environmental allergies   Hypertension - on medication.   Past Surgical History  Procedure Laterality Date  . Hernia repair  1995  . Wisdom tooth extraction      ALLERGIES: Darvocet; Floxin; Macrodantin; Penetrex; and Sulfa antibiotics  Current Outpatient Prescriptions  Medication Sig Dispense Refill  . azelastine (ASTELIN) 137 MCG/SPRAY nasal spray Place 1  spray into the nose 2 (two) times daily. Use in each nostril as directed      . Calcium Citrate-Vitamin D (CITRACAL + D PO) Take 2 tablets by mouth 3 (three) times daily.      . cetirizine (ZYRTEC) 10 MG tablet Take 10 mg by mouth daily.      . Cholecalciferol (VITAMIN D PO) Take 1,000 mg by mouth daily.       Marland Kitchen estradiol (ESTRACE) 0.1 MG/GM vaginal cream Place 0.5 g vaginally 3 (three) times a week.       . Misc Natural Products (OSTEO BI-FLEX JOINT SHIELD PO) Take 2 tablets by mouth daily.       . Montelukast Sodium (SINGULAIR PO) Take 1 tablet by mouth at bedtime.       . Multiple Vitamin (MULTIVITAMIN) capsule Take 1 capsule by mouth daily.      . OMEGA 3 1000 MG CAPS Take 1 capsule by mouth daily.       Marland Kitchen omeprazole (PRILOSEC) 20 MG capsule Take 20 mg by mouth 2 (two) times daily.        Marland Kitchen OVER THE COUNTER MEDICATION Tart cherry extract      . Triamcinolone Acetonide (NASACORT ALLERGY 24HR NA) Place into the nose.      . triamterene-hydrochlorothiazide (MAXZIDE-25) 37.5-25 MG per tablet Take 0.5 tablets by mouth daily.      . valsartan (DIOVAN) 80 MG tablet Take 80 mg by mouth daily.      . Eszopiclone (LUNESTA PO) Take by mouth at bedtime as needed.       . hydrocortisone valerate cream (WESTCORT) 0.2 % as needed.      . nystatin-triamcinolone ointment (MYCOLOG) Apply topically 2 (two) times daily.  30 g  1  . terconazole (TERAZOL 3) 0.8 % vaginal cream Place 1 applicator vaginally at bedtime. Use one applicator full qhs x 3 days  20 g  1  . terconazole (TERAZOL 7) 0.4 % vaginal cream Place 1 applicator vaginally at bedtime.  45 g  0   No current facility-administered medications for this visit.     ROS:  Pertinent items are noted in HPI.  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   PHYSICAL EXAMINATION:    BP 124/76  Pulse 81  Resp 14  Ht 5\' 3"  (1.6 m)  Wt 121 lb (54.885 kg)  BMI 21.44 kg/m2   Wt Readings from Last 3 Encounters:  02/16/14 121 lb (54.885 kg)  10/07/13 121 lb (54.885 kg)  06/03/13 119 lb (53.978 kg)     Ht Readings from Last 3 Encounters:  02/16/14 5\' 3"  (1.6 m)  10/07/13 5\' 3"  (1.6 m)  06/03/13 5\' 3"  (1.6 m)    General appearance: alert, cooperative and appears stated age Head: Normocephalic, without obvious abnormality, atraumatic Neck: no adenopathy, supple, symmetrical, trachea midline and thyroid not enlarged, symmetric, no tenderness/mass/nodules Lungs: clear to auscultation bilaterally Breasts: Inspection negative, No nipple retraction or dimpling, No nipple discharge or bleeding, No axillary or supraclavicular adenopathy, Normal to palpation without dominant masses Heart: regular rate and rhythm Abdomen: soft, non-tender; no masses,  no organomegaly Extremities: extremities normal, atraumatic, no cyanosis or  edema Skin: Skin color, texture, turgor normal. No rashes or lesions Lymph nodes: Cervical, supraclavicular, and axillary nodes normal. No abnormal inguinal nodes palpated Neurologic: Grossly normal  Pelvic: External genitalia:  no lesions              Urethra:  normal appearing urethra with  no masses, tenderness or lesions              Bartholins and Skenes: normal                 Vagina: normal appearing vagina with normal color and discharge, no lesions, minimal cystocele and rectocele.               Cervix: normal appearance              Pap and high risk HPV testing done: yes.        Bimanual Exam:  Uterus:  uterus is normal size, shape, consistency and nontender                                      Adnexa: normal adnexa in size, nontender and no masses                                      Rectovaginal: Confirms                                      Anus:  normal sphincter tone, no lesions  ASSESSMENT  Normal gynecologic exam. Osteopenia.  Remote history of cervical cryotherapy and AGUS pap.   PLAN  Mammogram recommended yearly.   Patient will schedule.  Pap smear and high risk HPV testing performed.  Refill Estrace cream.  Discussed risks of thromboembolic events and breast cancer.  Counseled on self breast exam, Calcium and vitamin D intake, exercise. Patient declines prescription for Prolia or Evista, which were reviewed today.  Printed materials also to patient regarding these options. Next bone density in one year.  Return annually or prn   An After Visit Summary was printed and given to the patient.  25 minute additional discussion regarding osteopenia and treatment options.

## 2014-02-17 LAB — IPS PAP SMEAR ONLY

## 2014-02-28 DIAGNOSIS — J309 Allergic rhinitis, unspecified: Secondary | ICD-10-CM | POA: Diagnosis not present

## 2014-03-02 DIAGNOSIS — Z1231 Encounter for screening mammogram for malignant neoplasm of breast: Secondary | ICD-10-CM | POA: Diagnosis not present

## 2014-03-06 DIAGNOSIS — T6391XA Toxic effect of contact with unspecified venomous animal, accidental (unintentional), initial encounter: Secondary | ICD-10-CM | POA: Diagnosis not present

## 2014-03-09 DIAGNOSIS — J309 Allergic rhinitis, unspecified: Secondary | ICD-10-CM | POA: Diagnosis not present

## 2014-03-22 DIAGNOSIS — J309 Allergic rhinitis, unspecified: Secondary | ICD-10-CM | POA: Diagnosis not present

## 2014-04-03 DIAGNOSIS — R109 Unspecified abdominal pain: Secondary | ICD-10-CM | POA: Diagnosis not present

## 2014-04-03 DIAGNOSIS — J309 Allergic rhinitis, unspecified: Secondary | ICD-10-CM | POA: Diagnosis not present

## 2014-04-07 DIAGNOSIS — T6391XA Toxic effect of contact with unspecified venomous animal, accidental (unintentional), initial encounter: Secondary | ICD-10-CM | POA: Diagnosis not present

## 2014-04-13 DIAGNOSIS — H6120 Impacted cerumen, unspecified ear: Secondary | ICD-10-CM | POA: Diagnosis not present

## 2014-04-13 DIAGNOSIS — R109 Unspecified abdominal pain: Secondary | ICD-10-CM | POA: Diagnosis not present

## 2014-04-14 DIAGNOSIS — J309 Allergic rhinitis, unspecified: Secondary | ICD-10-CM | POA: Diagnosis not present

## 2014-04-21 DIAGNOSIS — J309 Allergic rhinitis, unspecified: Secondary | ICD-10-CM | POA: Diagnosis not present

## 2014-05-02 DIAGNOSIS — J309 Allergic rhinitis, unspecified: Secondary | ICD-10-CM | POA: Diagnosis not present

## 2014-05-03 DIAGNOSIS — J309 Allergic rhinitis, unspecified: Secondary | ICD-10-CM | POA: Diagnosis not present

## 2014-05-04 DIAGNOSIS — L570 Actinic keratosis: Secondary | ICD-10-CM | POA: Diagnosis not present

## 2014-05-04 DIAGNOSIS — D485 Neoplasm of uncertain behavior of skin: Secondary | ICD-10-CM | POA: Diagnosis not present

## 2014-05-04 DIAGNOSIS — Z85828 Personal history of other malignant neoplasm of skin: Secondary | ICD-10-CM | POA: Diagnosis not present

## 2014-05-10 ENCOUNTER — Other Ambulatory Visit: Payer: Self-pay | Admitting: Gastroenterology

## 2014-05-10 DIAGNOSIS — K439 Ventral hernia without obstruction or gangrene: Secondary | ICD-10-CM | POA: Diagnosis not present

## 2014-05-10 DIAGNOSIS — R109 Unspecified abdominal pain: Secondary | ICD-10-CM | POA: Diagnosis not present

## 2014-05-10 DIAGNOSIS — T6391XA Toxic effect of contact with unspecified venomous animal, accidental (unintentional), initial encounter: Secondary | ICD-10-CM | POA: Diagnosis not present

## 2014-05-12 ENCOUNTER — Ambulatory Visit
Admission: RE | Admit: 2014-05-12 | Discharge: 2014-05-12 | Disposition: A | Discharge status: Home / Self Care | Payer: Medicare Other | Source: Ambulatory Visit | Attending: Gastroenterology | Admitting: Gastroenterology

## 2014-05-12 DIAGNOSIS — K429 Umbilical hernia without obstruction or gangrene: Secondary | ICD-10-CM | POA: Diagnosis not present

## 2014-05-12 DIAGNOSIS — R109 Unspecified abdominal pain: Secondary | ICD-10-CM

## 2014-05-12 MED ORDER — IOHEXOL 300 MG/ML  SOLN: 100.0000 mL | Freq: Once | INTRAMUSCULAR | Status: AC | PRN

## 2014-05-15 DIAGNOSIS — J309 Allergic rhinitis, unspecified: Secondary | ICD-10-CM | POA: Diagnosis not present

## 2014-05-24 ENCOUNTER — Telehealth: Payer: Self-pay | Admitting: Obstetrics and Gynecology

## 2014-05-24 NOTE — Telephone Encounter (Signed)
Patient needs to discuss her recent CT scan results. Patient says "some bladder issues were brought up from her CT scan". Patient would like an appointment with Edman Circle, FNP or Dr. Quincy Simmonds.

## 2014-05-24 NOTE — Telephone Encounter (Signed)
Spoke with patient. Patient states that she has been having some abdominal and pelvic discomfort and was referred to GI by her primary doctor. CT scan was done and "some bladder issues were seen." Patient would like to come in to discuss findings with Milford Cage, FNP or Dr.Silva. Patient states that she has gone through previous PT for "bladder dropping" but states that is must be causing her problems now. Was seen for AEX in March by Dr.Silva. Patient has an appointment next week with a surgeon but would like to come in before then so that she can have another opinion and discuss it further before going. Advised patient that I could get her in with Dr.Silva as this is among one of her specialties so that she can provide patient with further recommendations. Patient agreeable. Appointment scheduled for 6/12 at 10:30 with Dr.Silva. Patient agreeable to date and time.  Routing to provider for final review. Patient agreeable to disposition. Will close encounter

## 2014-05-25 DIAGNOSIS — L821 Other seborrheic keratosis: Secondary | ICD-10-CM | POA: Diagnosis not present

## 2014-05-25 DIAGNOSIS — Z85820 Personal history of malignant melanoma of skin: Secondary | ICD-10-CM | POA: Diagnosis not present

## 2014-05-25 DIAGNOSIS — B351 Tinea unguium: Secondary | ICD-10-CM | POA: Diagnosis not present

## 2014-05-25 DIAGNOSIS — Z85828 Personal history of other malignant neoplasm of skin: Secondary | ICD-10-CM | POA: Diagnosis not present

## 2014-05-25 DIAGNOSIS — D1801 Hemangioma of skin and subcutaneous tissue: Secondary | ICD-10-CM | POA: Diagnosis not present

## 2014-05-25 DIAGNOSIS — L819 Disorder of pigmentation, unspecified: Secondary | ICD-10-CM | POA: Diagnosis not present

## 2014-05-25 DIAGNOSIS — L570 Actinic keratosis: Secondary | ICD-10-CM | POA: Diagnosis not present

## 2014-05-26 ENCOUNTER — Encounter: Payer: Self-pay | Admitting: Obstetrics and Gynecology

## 2014-05-26 ENCOUNTER — Ambulatory Visit (INDEPENDENT_AMBULATORY_CARE_PROVIDER_SITE_OTHER): Payer: Medicare Other | Admitting: Obstetrics and Gynecology

## 2014-05-26 VITALS — BP 154/82 | HR 84 | Ht 63.0 in | Wt 119.8 lb

## 2014-05-26 DIAGNOSIS — K429 Umbilical hernia without obstruction or gangrene: Secondary | ICD-10-CM

## 2014-05-26 DIAGNOSIS — J309 Allergic rhinitis, unspecified: Secondary | ICD-10-CM | POA: Diagnosis not present

## 2014-05-26 DIAGNOSIS — N3289 Other specified disorders of bladder: Secondary | ICD-10-CM | POA: Diagnosis not present

## 2014-05-26 DIAGNOSIS — N812 Incomplete uterovaginal prolapse: Secondary | ICD-10-CM | POA: Diagnosis not present

## 2014-05-26 NOTE — Progress Notes (Signed)
Patient ID: Teresa Morales, female   DOB: 08-31-1941, 73 y.o.   MRN: [id number]  GYNECOLOGY  VISIT   HPI: 73 y.o.   Married  Caucasian  female   G1P1001 with Patient's last menstrual period was 12/15/1993.   here for  Lower abdominal pain onset this February.  Felt like a stomach virus.  Saw PCP show treated with Augmentin.  Was referred to Dr. Wynetta Emery, GI who performed evaluation in May 2015. CT scan noted umbilical hernia with omentum and possible omental infarction.  Severely distended bladder noted.    Has had umbilical hernia for years. Had a repair in 2001.  Seeing a general surgeon next week.   Has a known cystocele and has had physical therapy at [facility].   States lumpiness of the abdominal wall/skin in the groin region and is concerned about this. Reports that it comes and goes.   GYNECOLOGIC HISTORY: Patient's last menstrual period was 12/15/1993. Contraception:    Menopausal hormone therapy:         OB History   Grav Para Term Preterm Abortions TAB SAB Ect Mult Living   1 1 1       1          There are no active problems to display for this patient.   Past Medical History  Diagnosis Date  . Osteopenia     intolerant of bisphosphonates  . SIADH (syndrome of inappropriate ADH production) 1998  . Endocervical polyp 1997  . Urethral stenosis     dilated  . Mild dysplasia of cervix     laser  . 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 Interstitial cystitis 2004  . GERD (gastroesophageal reflux disease)   . Melanoma of face 04/2007  . Environmental allergies   . Hypertension     Past Surgical History  Procedure Laterality Date  . Hernia repair  1995  . Wisdom tooth extraction      Current Outpatient Prescriptions  Medication Sig Dispense Refill  . azelastine (ASTELIN) 137 MCG/SPRAY nasal spray Place 1 spray into the nose 2 (two) times daily. Use in each  nostril as directed      . Calcium Citrate-Vitamin D (CITRACAL + D PO) Take 2 tablets by mouth 3 (three) times daily.      . cetirizine (ZYRTEC) 10 MG tablet Take 10 mg by mouth daily.      Marland Kitchen CHERRY PO Take 3 tablets by mouth daily. Tart Cherry Extract      . Cholecalciferol (VITAMIN D PO) Take 1,000 mg by mouth daily.       Marland Kitchen EPIPEN 2-PAK 0.3 MG/0.3ML SOAJ injection as needed.      Marland Kitchen estradiol (ESTRACE) 0.1 MG/GM vaginal cream Use 1/2 g vaginally two or three times per week.  42.5 g  3  . Eszopiclone (LUNESTA PO) Take by mouth at bedtime as needed.       . hydrocortisone valerate cream (WESTCORT) 0.2 % as needed.      . Misc Natural Products (OSTEO BI-FLEX JOINT SHIELD PO) Take 2 tablets by mouth daily.       . Montelukast Sodium (SINGULAIR PO) Take 1 tablet by mouth at bedtime.       . Multiple Vitamin (MULTIVITAMIN) capsule Take 1 capsule by mouth daily.      . OMEGA 3 1000 MG CAPS Take 1 capsule by mouth daily.       Marland Kitchen  omeprazole (PRILOSEC) 20 MG capsule Take 20 mg by mouth 2 (two) times daily.       . Triamcinolone Acetonide (NASACORT ALLERGY 24HR NA) Place into the nose.      . triamcinolone cream (KENALOG) 0.1 % Apply topically as needed.      . triamterene-hydrochlorothiazide (MAXZIDE-25) 37.5-25 MG per tablet Take 0.5 tablets by mouth daily.      . valsartan (DIOVAN) 80 MG tablet Take 80 mg by mouth daily.      Marland Kitchen nystatin-triamcinolone ointment (MYCOLOG) Apply topically 2 (two) times daily.  30 g  1   No current facility-administered medications for this visit.     ALLERGIES: Darvocet; Floxin; Macrodantin; Penetrex; and Sulfa antibiotics  Family History  Problem Relation Age of Onset  . Other Mother 26    MVA   . Lupus Sister 76  . Prostate cancer Brother   . Hypertension Father   . Osteoporosis Sister   . Thyroid disease Sister   . Scoliosis Sister     History   Social History  . Marital Status: Married    Spouse Name: N/A    Number of Children: N/A  . Years of  Education: N/A   Occupational History  . Not on file.   Social History Main Topics  . Smoking status: Never Smoker   . Smokeless tobacco: Never Used  . Alcohol Use: No  . Drug Use: No  . Sexual Activity: Yes    Partners: Male    Birth Control/ Protection: Post-menopausal   Other Topics Concern  . Not on file   Social History Narrative  . No narrative on file    ROS:  Pertinent items are noted in HPI.  PHYSICAL EXAMINATION:    BP 154/82  Pulse 84  Ht 5\' 3"  (1.6 m)  Wt 119 lb 12.8 oz (54.341 kg)  BMI 21.23 kg/m2  LMP 12/15/1993     General appearance: alert, cooperative and appears stated age   Abdomen: soft, non-tender; no masses,  no organomegaly No abnormal inguinal nodes palpated  Pelvic: External genitalia:  no lesions              Urethra:  normal appearing urethra with no masses, tenderness or lesions              Bartholins and Skenes: normal                 Vagina: normal appearing vagina with normal color and discharge, no lesions, minimal cystocele, 1 - 2 degree rectocele.              Cervix: normal appearance                   Bimanual Exam:  Uterus:  uterus is normal size, shape, consistency and nontender                                      Adnexa: normal adnexa in size, nontender and no masses                                         Post Void Residual Check  Consent for procedure. Void 500 cc into a hat in the commode. Sterile prep of the urethra with betadine.  PVR by sterile catheterization - 18 cc.  ASSESSMENT  Umbilical hernia with trapped omentum. No evidence of urinary retention.  Pelvic organ prolapse. Possible additional hernia formation in femoral or inguinal area?  PLAN  Reassurance given regarding her bladder function.  She is voiding adequately.  Keep appointment with general surgeon for hernia evaluation and treatment.    Return prn.    An After Visit Summary was printed and given to the patient.  _40_____ minutes face  to face time of which over 50% was spent in counseling.

## 2014-05-31 ENCOUNTER — Ambulatory Visit (INDEPENDENT_AMBULATORY_CARE_PROVIDER_SITE_OTHER): Payer: Medicare Other | Admitting: General Surgery

## 2014-05-31 ENCOUNTER — Encounter (INDEPENDENT_AMBULATORY_CARE_PROVIDER_SITE_OTHER): Payer: Self-pay | Admitting: General Surgery

## 2014-05-31 VITALS — BP 138/80 | HR 83 | Temp 97.5°F | Ht 63.0 in | Wt 119.0 lb

## 2014-05-31 DIAGNOSIS — K432 Incisional hernia without obstruction or gangrene: Secondary | ICD-10-CM

## 2014-05-31 DIAGNOSIS — J309 Allergic rhinitis, unspecified: Secondary | ICD-10-CM | POA: Diagnosis not present

## 2014-05-31 NOTE — Progress Notes (Signed)
Patient ID: Teresa Morales, female   DOB: 1941-11-15, 73 y.o.   MRN: [id number]  Chief Complaint  Patient presents with  . eval hernia    HPI Teresa Morales is a 73 y.o. female.  The patient is a 73 year old female who is referred by Dr. Wynetta Emery for evaluation of a recurrent ventral hernia. The patient states it has been there for several years. She appears to have this repaired in 2001, it was primarily repaired with sutures. She states she has some abdominal discomfort. She states that the umbilical hernia has gotten bigger over the last several years. She does not have any overt pain does have some abdominal discomfort as described previously.   The patient has a CT scan which reveals incarcerated omentum. HPI  Past Medical History  Diagnosis Date  . Osteopenia     intolerant of bisphosphonates  . SIADH (syndrome of inappropriate ADH production) 1998  . Endocervical polyp 1997  . Urethral stenosis     dilated  . Mild dysplasia of cervix     laser  . 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 Interstitial cystitis 2004  . GERD (gastroesophageal reflux disease)   . Melanoma of face 04/2007  . Environmental allergies   . Hypertension     Past Surgical History  Procedure Laterality Date  . Hernia repair  1995  . Wisdom tooth extraction      Family History  Problem Relation Age of Onset  . Other Mother 33    MVA   . Lupus Sister 50  . Prostate cancer Brother   . Hypertension Father   . Osteoporosis Sister   . Thyroid disease Sister   . Scoliosis Sister     Social History History  Substance Use Topics  . Smoking status: Never Smoker   . Smokeless tobacco: Never Used  . Alcohol Use: No    Allergies  Allergen Reactions  . Darvocet [Propoxyphene N-Acetaminophen]   . Floxin [Ofloxacin]   . Macrodantin [Nitrofurantoin]   . Penetrex [Enoxacin]   . Sulfa Antibiotics     Current  Outpatient Prescriptions  Medication Sig Dispense Refill  . azelastine (ASTELIN) 137 MCG/SPRAY nasal spray Place 1 spray into the nose 2 (two) times daily. Use in each nostril as directed      . Calcium Citrate-Vitamin D (CITRACAL + D PO) Take 2 tablets by mouth 3 (three) times daily.      . cetirizine (ZYRTEC) 10 MG tablet Take 10 mg by mouth daily.      Marland Kitchen CHERRY PO Take 3 tablets by mouth daily. Tart Cherry Extract      . Cholecalciferol (VITAMIN D PO) Take 1,000 mg by mouth daily.       Marland Kitchen EPIPEN 2-PAK 0.3 MG/0.3ML SOAJ injection as needed.      Marland Kitchen estradiol (ESTRACE) 0.1 MG/GM vaginal cream Use 1/2 g vaginally two or three times per week.  42.5 g  3  . Eszopiclone (LUNESTA PO) Take by mouth at bedtime as needed.       . hydrocortisone valerate cream (WESTCORT) 0.2 % as needed.      . Misc Natural Products (OSTEO BI-FLEX JOINT SHIELD PO) Take 2 tablets by mouth daily.       . Montelukast Sodium (SINGULAIR PO) Take 1 tablet by mouth at bedtime.       . Multiple Vitamin (MULTIVITAMIN) capsule Take 1 capsule by mouth daily.      Marland Kitchen  nystatin-triamcinolone ointment (MYCOLOG) Apply topically 2 (two) times daily.  30 g  1  . OMEGA 3 1000 MG CAPS Take 1 capsule by mouth daily.       Marland Kitchen omeprazole (PRILOSEC) 20 MG capsule Take 20 mg by mouth 2 (two) times daily.       . Triamcinolone Acetonide (NASACORT ALLERGY 24HR NA) Place into the nose.      . triamcinolone cream (KENALOG) 0.1 % Apply topically as needed.      . triamterene-hydrochlorothiazide (MAXZIDE-25) 37.5-25 MG per tablet Take 0.5 tablets by mouth daily.      . valsartan (DIOVAN) 80 MG tablet Take 80 mg by mouth daily.       No current facility-administered medications for this visit.    Review of Systems Review of Systems  Constitutional: Negative.   HENT: Negative.   Respiratory: Negative.   Cardiovascular: Negative.   Gastrointestinal: Negative.   Neurological: Negative.   All other systems reviewed and are negative.   Blood  pressure 138/80, pulse 83, temperature 97.5 F (36.4 C), height 5\' 3"  (1.6 m), weight 119 lb (53.978 kg), last menstrual period 12/15/1993.  Physical Exam Physical Exam  Constitutional: She is oriented to person, place, and time. She appears well-developed and well-nourished.  HENT:  Head: Normocephalic and atraumatic.  Eyes: Conjunctivae and EOM are normal. Pupils are equal, round, and reactive to light.  Neck: Normal range of motion. Neck supple.  Cardiovascular: Normal rate, regular rhythm and normal heart sounds.   Pulmonary/Chest: Effort normal and breath sounds normal.  Abdominal: Soft. Bowel sounds are normal. She exhibits no distension and no mass. There is no tenderness. There is no rebound and no guarding. A hernia is present. Hernia confirmed positive in the ventral area.    Musculoskeletal: Normal range of motion.  Neurological: She is alert and oriented to person, place, and time.  Skin: Skin is warm and dry.  Psychiatric: She has a normal mood and affect.    Data Reviewed As above  Assessment    73 year old female with a recurrent umbilical hernia.     Plan    1. The patient would like to have this repaired a laparoscopic ventral hernia repair with mesh. 2. All risks and benefits were discussed with the patient, to generally include infection, bleeding, damage to surrounding structures, acute and chronic nerve pain, and recurrence. Alternatives were offered and described.  All questions were answered and the patient voiced understanding of the procedure and wishes to proceed at this point.        Rosario Jacks., Armando 05/31/2014, 10:50 AM

## 2014-05-31 NOTE — Addendum Note (Signed)
Addended by: Ralene Ok on: 05/31/2014 12:07 PM   Modules accepted: Orders

## 2014-06-09 DIAGNOSIS — J309 Allergic rhinitis, unspecified: Secondary | ICD-10-CM | POA: Diagnosis not present

## 2014-06-12 DIAGNOSIS — T6391XA Toxic effect of contact with unspecified venomous animal, accidental (unintentional), initial encounter: Secondary | ICD-10-CM | POA: Diagnosis not present

## 2014-06-19 DIAGNOSIS — J309 Allergic rhinitis, unspecified: Secondary | ICD-10-CM | POA: Diagnosis not present

## 2014-06-28 DIAGNOSIS — M810 Age-related osteoporosis without current pathological fracture: Secondary | ICD-10-CM | POA: Diagnosis not present

## 2014-06-28 DIAGNOSIS — R109 Unspecified abdominal pain: Secondary | ICD-10-CM | POA: Diagnosis not present

## 2014-06-28 DIAGNOSIS — E559 Vitamin D deficiency, unspecified: Secondary | ICD-10-CM | POA: Diagnosis not present

## 2014-06-28 DIAGNOSIS — J309 Allergic rhinitis, unspecified: Secondary | ICD-10-CM | POA: Diagnosis not present

## 2014-06-28 DIAGNOSIS — I10 Essential (primary) hypertension: Secondary | ICD-10-CM | POA: Diagnosis not present

## 2014-06-28 DIAGNOSIS — E782 Mixed hyperlipidemia: Secondary | ICD-10-CM | POA: Diagnosis not present

## 2014-06-28 DIAGNOSIS — K439 Ventral hernia without obstruction or gangrene: Secondary | ICD-10-CM | POA: Diagnosis not present

## 2014-06-28 DIAGNOSIS — M79609 Pain in unspecified limb: Secondary | ICD-10-CM | POA: Diagnosis not present

## 2014-06-28 DIAGNOSIS — E871 Hypo-osmolality and hyponatremia: Secondary | ICD-10-CM | POA: Diagnosis not present

## 2014-06-28 DIAGNOSIS — Z1331 Encounter for screening for depression: Secondary | ICD-10-CM | POA: Diagnosis not present

## 2014-06-28 DIAGNOSIS — Z0000 Encounter for general adult medical examination without abnormal findings: Secondary | ICD-10-CM | POA: Diagnosis not present

## 2014-06-30 DIAGNOSIS — J309 Allergic rhinitis, unspecified: Secondary | ICD-10-CM | POA: Diagnosis not present

## 2014-07-07 DIAGNOSIS — T6391XA Toxic effect of contact with unspecified venomous animal, accidental (unintentional), initial encounter: Secondary | ICD-10-CM | POA: Diagnosis not present

## 2014-07-11 DIAGNOSIS — J309 Allergic rhinitis, unspecified: Secondary | ICD-10-CM | POA: Diagnosis not present

## 2014-07-11 DIAGNOSIS — T6391XA Toxic effect of contact with unspecified venomous animal, accidental (unintentional), initial encounter: Secondary | ICD-10-CM | POA: Diagnosis not present

## 2014-07-14 DIAGNOSIS — J309 Allergic rhinitis, unspecified: Secondary | ICD-10-CM | POA: Diagnosis not present

## 2014-07-18 ENCOUNTER — Other Ambulatory Visit (INDEPENDENT_AMBULATORY_CARE_PROVIDER_SITE_OTHER): Payer: Self-pay

## 2014-07-18 DIAGNOSIS — K436 Other and unspecified ventral hernia with obstruction, without gangrene: Secondary | ICD-10-CM | POA: Diagnosis not present

## 2014-07-18 DIAGNOSIS — K432 Incisional hernia without obstruction or gangrene: Secondary | ICD-10-CM

## 2014-07-18 MED ORDER — OXYCODONE-ACETAMINOPHEN 5-325 MG PO TABS: 1.0000 | ORAL_TABLET | ORAL | Status: DC | PRN

## 2014-07-24 ENCOUNTER — Encounter (INDEPENDENT_AMBULATORY_CARE_PROVIDER_SITE_OTHER): Payer: Self-pay | Admitting: General Surgery

## 2014-07-24 ENCOUNTER — Ambulatory Visit (INDEPENDENT_AMBULATORY_CARE_PROVIDER_SITE_OTHER): Payer: Medicare Other | Admitting: General Surgery

## 2014-07-24 ENCOUNTER — Telehealth (INDEPENDENT_AMBULATORY_CARE_PROVIDER_SITE_OTHER): Payer: Self-pay

## 2014-07-24 VITALS — BP 140/86 | HR 71 | Temp 97.7°F | Resp 16 | Wt 119.2 lb

## 2014-07-24 DIAGNOSIS — T7840XA Allergy, unspecified, initial encounter: Secondary | ICD-10-CM

## 2014-07-24 NOTE — Progress Notes (Signed)
Patient ID: Teresa Morales, female   DOB: [birth year], 73 y.o.   MRN: [id number] Post op course The patient is a 73 year old female status post microscopic ventral hernia repair with mesh one week ago. The patient states complaining of a rash and itching in the distribution of the ChloraPrep. The patient is taken Benadryl and home. She states that she did not shower for 5 days after surgery.  On Exam: The patient has a punctate type rash in the distribution of the ChloraPrep stick  Assessment and Plan 73 year old female status post laparoscopic ventral hernia repair and allergic reaction due to ChloraPrep/dye 1. This is to be getting better and will continue to receive the next several days. I recommended calamine lotion to help the itching. The patient can take Benadryl as needed. 2. The patient and follow back up in one week for routine followup visit.   Ralene Ok, MD [facility] General & Minimally Invasive Surgery Trauma & Emergency Surgery

## 2014-07-24 NOTE — Telephone Encounter (Signed)
Pt s/p VHR on 07/18/14 with Dr Rosendo Gros. Pt states that on 07/19/14 she noticed that she started having a rash on her torso. Pt believed that this was coming from her oxycodone. She stopped taking the Oxycodone and started taking Tylenol. Pt states that the itching and rash has continued and has gotten worse. Pt states that she has taken Benadryl with no relief. Advised pt that she can place hydrocortisone cream on the area as long as its not on the incision. Pt states that the incision area is also red and swollen and she would like to come in to see Dr Rosendo Gros. Pt was given a urge appt with Dr Rosendo Gros for this afternoon.

## 2014-08-01 ENCOUNTER — Encounter (INDEPENDENT_AMBULATORY_CARE_PROVIDER_SITE_OTHER): Payer: Self-pay | Admitting: General Surgery

## 2014-08-01 ENCOUNTER — Ambulatory Visit (INDEPENDENT_AMBULATORY_CARE_PROVIDER_SITE_OTHER): Payer: Medicare Other | Admitting: General Surgery

## 2014-08-01 VITALS — BP 144/72 | HR 79 | Temp 97.7°F | Ht 63.0 in | Wt 118.0 lb

## 2014-08-01 DIAGNOSIS — Z9889 Other specified postprocedural states: Secondary | ICD-10-CM

## 2014-08-01 NOTE — Progress Notes (Signed)
Patient ID: Teresa Morales, female   DOB: 02-04-1941, 73 y.o.   MRN: [id number] Post op course The patient is a 73 y/o F s/p lap VHR with mesh.  Patient has been doing well postoperatively. Her rash has resolved. Patient does state she's had some abnormal bowel function of constipation initially now soft bowels. The patient has been ambulating and exercising without issues.  On Exam: Wounds clean dry and intact  Assessment and Plan 73 year old female status post lap and hernia apparent mesh 1. We discussed what restrictions for another month 2. Patient follow up as needed   Ralene Ok, MD [facility] General & Minimally Invasive Surgery Trauma & Emergency Surgery

## 2014-08-04 DIAGNOSIS — J309 Allergic rhinitis, unspecified: Secondary | ICD-10-CM | POA: Diagnosis not present

## 2014-08-07 DIAGNOSIS — T6391XA Toxic effect of contact with unspecified venomous animal, accidental (unintentional), initial encounter: Secondary | ICD-10-CM | POA: Diagnosis not present

## 2014-08-09 DIAGNOSIS — J309 Allergic rhinitis, unspecified: Secondary | ICD-10-CM | POA: Diagnosis not present

## 2014-08-18 DIAGNOSIS — J309 Allergic rhinitis, unspecified: Secondary | ICD-10-CM | POA: Diagnosis not present

## 2014-08-23 DIAGNOSIS — R109 Unspecified abdominal pain: Secondary | ICD-10-CM | POA: Diagnosis not present

## 2014-09-01 DIAGNOSIS — J309 Allergic rhinitis, unspecified: Secondary | ICD-10-CM | POA: Diagnosis not present

## 2014-09-04 DIAGNOSIS — T6391XA Toxic effect of contact with unspecified venomous animal, accidental (unintentional), initial encounter: Secondary | ICD-10-CM | POA: Diagnosis not present

## 2014-09-28 DIAGNOSIS — E782 Mixed hyperlipidemia: Secondary | ICD-10-CM | POA: Diagnosis not present

## 2014-09-28 DIAGNOSIS — Z79899 Other long term (current) drug therapy: Secondary | ICD-10-CM | POA: Diagnosis not present

## 2014-09-28 DIAGNOSIS — K219 Gastro-esophageal reflux disease without esophagitis: Secondary | ICD-10-CM | POA: Diagnosis not present

## 2014-09-28 DIAGNOSIS — K439 Ventral hernia without obstruction or gangrene: Secondary | ICD-10-CM | POA: Diagnosis not present

## 2014-09-28 DIAGNOSIS — J309 Allergic rhinitis, unspecified: Secondary | ICD-10-CM | POA: Diagnosis not present

## 2014-09-28 DIAGNOSIS — I10 Essential (primary) hypertension: Secondary | ICD-10-CM | POA: Diagnosis not present

## 2014-09-28 DIAGNOSIS — E559 Vitamin D deficiency, unspecified: Secondary | ICD-10-CM | POA: Diagnosis not present

## 2014-09-28 DIAGNOSIS — E871 Hypo-osmolality and hyponatremia: Secondary | ICD-10-CM | POA: Diagnosis not present

## 2014-10-03 DIAGNOSIS — Z23 Encounter for immunization: Secondary | ICD-10-CM | POA: Diagnosis not present

## 2014-10-16 ENCOUNTER — Encounter (INDEPENDENT_AMBULATORY_CARE_PROVIDER_SITE_OTHER): Payer: Self-pay | Admitting: General Surgery

## 2014-10-17 DIAGNOSIS — J069 Acute upper respiratory infection, unspecified: Secondary | ICD-10-CM | POA: Diagnosis not present

## 2014-10-17 DIAGNOSIS — R05 Cough: Secondary | ICD-10-CM | POA: Diagnosis not present

## 2014-10-20 ENCOUNTER — Telehealth: Payer: Self-pay | Admitting: Obstetrics and Gynecology

## 2014-10-20 ENCOUNTER — Ambulatory Visit (INDEPENDENT_AMBULATORY_CARE_PROVIDER_SITE_OTHER): Payer: Medicare Other | Admitting: Certified Nurse Midwife

## 2014-10-20 ENCOUNTER — Encounter: Payer: Self-pay | Admitting: Certified Nurse Midwife

## 2014-10-20 VITALS — BP 140/74 | HR 72 | Ht 63.0 in | Wt 118.0 lb

## 2014-10-20 DIAGNOSIS — B3731 Acute candidiasis of vulva and vagina: Secondary | ICD-10-CM

## 2014-10-20 DIAGNOSIS — R350 Frequency of micturition: Secondary | ICD-10-CM | POA: Diagnosis not present

## 2014-10-20 DIAGNOSIS — B373 Candidiasis of vulva and vagina: Secondary | ICD-10-CM

## 2014-10-20 LAB — POCT URINALYSIS DIPSTICK
BILIRUBIN UA: NEGATIVE
Glucose, UA: NEGATIVE
Ketones, UA: NEGATIVE
LEUKOCYTES UA: NEGATIVE
NITRITE UA: NEGATIVE
Protein, UA: NEGATIVE
Urobilinogen, UA: NEGATIVE
pH, UA: 8

## 2014-10-20 MED ORDER — TERCONAZOLE 0.4 % VA CREA: 1.0000 | TOPICAL_CREAM | Freq: Every day | VAGINAL | Status: DC

## 2014-10-20 NOTE — Telephone Encounter (Signed)
Patient calling saying she wants to be seen today for urinary tract infections.

## 2014-10-20 NOTE — Progress Notes (Signed)
73 y.o.married white female g1p1001 here with complaint of vaginal symptoms of itching, and irritation and some urinary incontinence with coughing. Seen by PCP yesterday for viral  URI, on medication with inhaler. . Onset of symptoms 4-5 days ago. Denies new personal products. Uses Estrace cream for  vaginal dryness. Patient has been wearing pads consistently for urinary leakage with cough. Denies urinary frequency,burning or pain with urination. Denies fever, chills or back pain.   O:Healthy female WDWN Affect: normal, orientation x 3  Exam:Skin: Warm and dry CVAT: negative bilateral Abdomen:soft, non tender Lymph node: no enlargement or tenderness Pelvic exam: External genital: atrophic with slight scaling and exudate BUS: negative, Bladder non tender Vagina: scant white  discharge noted. Ph: 4.5   ,Wet prep taken Cervix: normal, non tender Uterus: normal, non tender Adnexa:normal, non tender, no masses or fullness noted   Wet Prep results: positive for yeast   A:Yeast vaginitis   P:Discussed findings of yeast vaginitis and etiology. Discussed Aveeno or baking soda sitz bath for comfort. Avoid moist clothes or pads for extended period of time. If working out in gym clothes for long periods of time change underwear.  Olive Oil or Coconut oil use for skin protection prior to activity can be used to external skin. Consider Depend underwear instead of pads.  Rx: Terazol 7 see order with instructions  Rv prn

## 2014-10-20 NOTE — Telephone Encounter (Signed)
Spoke with patient. She states she started this week with a viral URI and saw pcp who treated with inhalers and comfort care measures. Patient states with increased coughing, she needed to use panty liners due to leaking of urine with cough.  Patient reports vaginal irritation that started yesterday and used vitamin E oil to skin hoping it would help symptoms. Irritated skin symptoms are worse today and now patient feeling increased pelvic pressure and pressure during voiding. No dysuria.  No fevers, no chills, no back pain.  Offered first available office visit with provider, Regina Eck CNM for evaluation today for symptoms as patient requests office visit today. Patient agreeable.    cc Dr. Quincy Simmonds,.

## 2014-10-20 NOTE — Patient Instructions (Signed)

## 2014-10-21 ENCOUNTER — Telehealth: Payer: Self-pay | Admitting: Obstetrics and Gynecology

## 2014-10-21 LAB — URINALYSIS, MICROSCOPIC ONLY
Bacteria, UA: NONE SEEN
CRYSTALS: NONE SEEN
Casts: NONE SEEN
Squamous Epithelial / LPF: NONE SEEN

## 2014-10-21 MED ORDER — PHENAZOPYRIDINE HCL 95 MG PO TABS: 95.0000 mg | ORAL_TABLET | Freq: Three times a day (TID) | ORAL | Status: DC | PRN

## 2014-10-21 MED ORDER — CEPHALEXIN 500 MG PO CAPS: 500.0000 mg | ORAL_CAPSULE | Freq: Four times a day (QID) | ORAL | Status: DC

## 2014-10-21 NOTE — Telephone Encounter (Signed)
Phone call returned to patient.  Calling with frequency, burning and pain with foul odor to urine. Worried about UTI. Seen yesterday and treated with Teronazole.  Will treat with Keflex 500 mg po qid x 7 days. Pyridium 100 mg po tid  Instructed in use of each. Hydrate well.  Needs to be seen for fever, nausea and vomiting.  Office will call with UC results Monday or Tuesday that was done in the office yesterday.

## 2014-10-23 ENCOUNTER — Encounter: Payer: Self-pay | Admitting: Certified Nurse Midwife

## 2014-10-23 ENCOUNTER — Other Ambulatory Visit: Payer: Self-pay | Admitting: Certified Nurse Midwife

## 2014-10-23 DIAGNOSIS — N390 Urinary tract infection, site not specified: Secondary | ICD-10-CM

## 2014-10-23 LAB — URINE CULTURE

## 2014-10-23 MED ORDER — AMPICILLIN 250 MG PO CAPS: 250.0000 mg | ORAL_CAPSULE | Freq: Four times a day (QID) | ORAL | Status: DC

## 2014-10-24 NOTE — Progress Notes (Signed)
Reviewed personally.  M. Suzanne Favian Kittleson, MD.  

## 2014-10-27 ENCOUNTER — Telehealth: Payer: Self-pay | Admitting: Certified Nurse Midwife

## 2014-10-27 MED ORDER — NYSTATIN-TRIAMCINOLONE 100000-0.1 UNIT/GM-% EX OINT
TOPICAL_OINTMENT | Freq: Two times a day (BID) | CUTANEOUS | Status: DC
Start: 2014-10-27 — End: 2015-10-02

## 2014-10-27 NOTE — Telephone Encounter (Signed)
Seen 10-20-14 by Debbi,  on Keflex and Terazol for UTI and yeast. Urinary frequency and vaginal itching have improved. Yesterday started experiencing intense and constant pain and burning of external skin,up around urethra and around opening of vagina. Skin very inflamed and irritated, kept waking her up. She applied Triamcinolone cram that she had at home X1 this am in attempt to provide some relief. Has never had this type of reaction to Terazol before. Has old tube of Nystatin/triamcinolone at home if this is an option. Instructed not to apply any more medication until Debbi can review message.  Please advise.

## 2014-10-27 NOTE — Telephone Encounter (Signed)
Patient notified of Ms. Debbie's recommendations, patient's rx for the nystatin/triamcinolone rx expired 10/2013. Per Ms. Debbie okay to send in rx to [facility] [place]. Patient is aware and appreciative of our help.  Routed to provider for review, encounter closed.

## 2014-10-27 NOTE — Telephone Encounter (Signed)
Patient requesting to speak with nurse about "constant pain and burning" in her "urethra." She was seen recently but continues to have problems and is requesting advice.  [id number] - [place], [place]

## 2014-10-27 NOTE — Telephone Encounter (Signed)
Please notify patient that she has probably taken of the yeast internal, but external still present with being uncomfortable. If does baths Aveeno Oatmeal bath will relieve or baking soda. If not baths can mix in large drink cup with water and pour over skin while on toilet to soothe. If her nystatin/trimin. Is still current she use this for the next 5 days to treat. If not resolving will need ov

## 2014-10-30 DIAGNOSIS — J309 Allergic rhinitis, unspecified: Secondary | ICD-10-CM | POA: Diagnosis not present

## 2014-11-06 ENCOUNTER — Encounter: Payer: Self-pay | Admitting: Certified Nurse Midwife

## 2014-11-06 ENCOUNTER — Ambulatory Visit (INDEPENDENT_AMBULATORY_CARE_PROVIDER_SITE_OTHER): Payer: Medicare Other | Admitting: Certified Nurse Midwife

## 2014-11-06 VITALS — BP 120/72 | HR 70 | Resp 16 | Ht 63.0 in | Wt 117.0 lb

## 2014-11-06 DIAGNOSIS — N390 Urinary tract infection, site not specified: Secondary | ICD-10-CM | POA: Diagnosis not present

## 2014-11-06 DIAGNOSIS — B373 Candidiasis of vulva and vagina: Secondary | ICD-10-CM | POA: Diagnosis not present

## 2014-11-06 DIAGNOSIS — B3731 Acute candidiasis of vulva and vagina: Secondary | ICD-10-CM

## 2014-11-06 LAB — POCT URINALYSIS DIPSTICK
Bilirubin, UA: NEGATIVE
Glucose, UA: NEGATIVE
KETONES UA: NEGATIVE
Leukocytes, UA: NEGATIVE
Nitrite, UA: NEGATIVE
Protein, UA: NEGATIVE
RBC UA: NEGATIVE
UROBILINOGEN UA: NEGATIVE
pH, UA: 5

## 2014-11-06 NOTE — Progress Notes (Signed)
73 y.o. Married Caucasian female G1P1001 here for follow up of 10/20/14 treated with Terazol 7 initiated on 10/20/14. Also treated for UTI once urine micro/culture with Keflex started on 10/21/14. Completed all medication as directed.  Denies any symptoms of urinary, frequency or pain at this time. Vaginal itching and burning has resolved. She still has sone sensitive area around urethral meatus. Putting Estrace cream to area without relief. Using Estrace cream vaginally 2 x weekly now. No other concerns today.   O: Healthy WD,WN female Affect: Normal, orientation x 3 Skin:warm and dry Abdomen:soft, negative suprapubic Pelvic exam:EXTERNAL GENITALIA: atrophic appearing vulva with no masses, tenderness or lesions Bladder/urethra non tender, urethral meatus slightly tender, slightly pink VAGINA: no abnormal discharge or lesions, ph 4.5 wet prep taken CERVIX: no lesions or cervical motion tenderness and normal UTERUS: normal, non tender ADNEXA: no masses palpable and nontender  Wet prep negative  A:UTI probably resolved Yeast vaginitis resolved    P: Discussed findings of UTI appears to be resolved, urine negative. Discussed using Pyridium for 2 days to see if slight tenderness resolved. Patient to leave off any cream to area. Will advise if not resolving. Pat only after urination. Discussed negative wet prep no indication of yeast. Continue Estrace cream as prescribed, can use Coconut oil for dryness external if needed.  Poct urine-neg Labs Urine culture   RV prn

## 2014-11-06 NOTE — Patient Instructions (Signed)

## 2014-11-07 LAB — URINE CULTURE: Colony Count: 4000

## 2014-11-08 DIAGNOSIS — J309 Allergic rhinitis, unspecified: Secondary | ICD-10-CM | POA: Diagnosis not present

## 2014-11-08 NOTE — Progress Notes (Signed)
Reviewed personally.  M. Suzanne Phillipe Clemon, MD.  

## 2014-11-13 DIAGNOSIS — T63441A Toxic effect of venom of bees, accidental (unintentional), initial encounter: Secondary | ICD-10-CM | POA: Diagnosis not present

## 2014-11-15 ENCOUNTER — Ambulatory Visit: Payer: Medicare Other | Attending: Sports Medicine | Admitting: Physical Therapy

## 2014-11-15 DIAGNOSIS — M25559 Pain in unspecified hip: Secondary | ICD-10-CM | POA: Diagnosis not present

## 2014-11-15 DIAGNOSIS — M545 Low back pain: Secondary | ICD-10-CM | POA: Diagnosis not present

## 2014-11-17 ENCOUNTER — Ambulatory Visit: Payer: Medicare Other | Admitting: Physical Therapy

## 2014-11-17 DIAGNOSIS — M25559 Pain in unspecified hip: Secondary | ICD-10-CM | POA: Diagnosis not present

## 2014-11-20 ENCOUNTER — Ambulatory Visit: Payer: Medicare Other | Admitting: Physical Therapy

## 2014-11-20 DIAGNOSIS — M25559 Pain in unspecified hip: Secondary | ICD-10-CM | POA: Diagnosis not present

## 2014-11-21 DIAGNOSIS — J309 Allergic rhinitis, unspecified: Secondary | ICD-10-CM | POA: Diagnosis not present

## 2014-11-22 DIAGNOSIS — K439 Ventral hernia without obstruction or gangrene: Secondary | ICD-10-CM | POA: Diagnosis not present

## 2014-11-24 ENCOUNTER — Ambulatory Visit: Payer: Medicare Other | Admitting: Physical Therapy

## 2014-11-24 DIAGNOSIS — M25559 Pain in unspecified hip: Secondary | ICD-10-CM | POA: Diagnosis not present

## 2014-11-28 ENCOUNTER — Ambulatory Visit: Payer: Medicare Other | Admitting: Physical Therapy

## 2014-11-28 DIAGNOSIS — M25559 Pain in unspecified hip: Secondary | ICD-10-CM | POA: Diagnosis not present

## 2014-11-30 ENCOUNTER — Ambulatory Visit: Payer: Medicare Other | Admitting: Physical Therapy

## 2014-11-30 DIAGNOSIS — M25559 Pain in unspecified hip: Secondary | ICD-10-CM | POA: Diagnosis not present

## 2014-12-01 DIAGNOSIS — J309 Allergic rhinitis, unspecified: Secondary | ICD-10-CM | POA: Diagnosis not present

## 2014-12-12 ENCOUNTER — Ambulatory Visit: Payer: Medicare Other | Admitting: Physical Therapy

## 2014-12-12 DIAGNOSIS — M25559 Pain in unspecified hip: Secondary | ICD-10-CM | POA: Diagnosis not present

## 2014-12-12 DIAGNOSIS — J309 Allergic rhinitis, unspecified: Secondary | ICD-10-CM | POA: Diagnosis not present

## 2014-12-15 DIAGNOSIS — M51369 Other intervertebral disc degeneration, lumbar region without mention of lumbar back pain or lower extremity pain: Secondary | ICD-10-CM

## 2014-12-15 DIAGNOSIS — M5136 Other intervertebral disc degeneration, lumbar region: Secondary | ICD-10-CM

## 2014-12-15 HISTORY — DX: Other intervertebral disc degeneration, lumbar region: M51.36

## 2014-12-15 HISTORY — PX: OTHER SURGICAL HISTORY: SHX169

## 2014-12-15 HISTORY — DX: Other intervertebral disc degeneration, lumbar region without mention of lumbar back pain or lower extremity pain: M51.369

## 2014-12-18 DIAGNOSIS — K4020 Bilateral inguinal hernia, without obstruction or gangrene, not specified as recurrent: Secondary | ICD-10-CM | POA: Diagnosis not present

## 2014-12-19 DIAGNOSIS — M79651 Pain in right thigh: Secondary | ICD-10-CM | POA: Diagnosis not present

## 2014-12-19 DIAGNOSIS — I8311 Varicose veins of right lower extremity with inflammation: Secondary | ICD-10-CM | POA: Diagnosis not present

## 2014-12-19 DIAGNOSIS — M79604 Pain in right leg: Secondary | ICD-10-CM | POA: Diagnosis not present

## 2014-12-20 DIAGNOSIS — R58 Hemorrhage, not elsewhere classified: Secondary | ICD-10-CM | POA: Diagnosis not present

## 2014-12-20 DIAGNOSIS — I87323 Chronic venous hypertension (idiopathic) with inflammation of bilateral lower extremity: Secondary | ICD-10-CM | POA: Diagnosis not present

## 2014-12-21 ENCOUNTER — Ambulatory Visit: Payer: Medicare Other | Attending: Sports Medicine | Admitting: Physical Therapy

## 2014-12-21 DIAGNOSIS — M545 Low back pain: Secondary | ICD-10-CM | POA: Diagnosis not present

## 2014-12-21 DIAGNOSIS — M25559 Pain in unspecified hip: Secondary | ICD-10-CM | POA: Diagnosis not present

## 2014-12-25 DIAGNOSIS — T63441A Toxic effect of venom of bees, accidental (unintentional), initial encounter: Secondary | ICD-10-CM | POA: Diagnosis not present

## 2014-12-29 DIAGNOSIS — I83811 Varicose veins of right lower extremities with pain: Secondary | ICD-10-CM | POA: Diagnosis not present

## 2015-01-03 DIAGNOSIS — T63441A Toxic effect of venom of bees, accidental (unintentional), initial encounter: Secondary | ICD-10-CM | POA: Diagnosis not present

## 2015-01-04 ENCOUNTER — Ambulatory Visit: Payer: Medicare Other | Admitting: Physical Therapy

## 2015-01-04 DIAGNOSIS — M25559 Pain in unspecified hip: Secondary | ICD-10-CM | POA: Diagnosis not present

## 2015-01-04 DIAGNOSIS — M545 Low back pain: Secondary | ICD-10-CM | POA: Diagnosis not present

## 2015-01-10 DIAGNOSIS — R58 Hemorrhage, not elsewhere classified: Secondary | ICD-10-CM | POA: Diagnosis not present

## 2015-01-10 DIAGNOSIS — I83811 Varicose veins of right lower extremities with pain: Secondary | ICD-10-CM | POA: Diagnosis not present

## 2015-01-10 DIAGNOSIS — I8311 Varicose veins of right lower extremity with inflammation: Secondary | ICD-10-CM | POA: Diagnosis not present

## 2015-01-12 DIAGNOSIS — R58 Hemorrhage, not elsewhere classified: Secondary | ICD-10-CM | POA: Diagnosis not present

## 2015-01-12 DIAGNOSIS — I8311 Varicose veins of right lower extremity with inflammation: Secondary | ICD-10-CM | POA: Diagnosis not present

## 2015-01-12 DIAGNOSIS — I83811 Varicose veins of right lower extremities with pain: Secondary | ICD-10-CM | POA: Diagnosis not present

## 2015-01-17 DIAGNOSIS — T63441A Toxic effect of venom of bees, accidental (unintentional), initial encounter: Secondary | ICD-10-CM | POA: Diagnosis not present

## 2015-01-19 DIAGNOSIS — J309 Allergic rhinitis, unspecified: Secondary | ICD-10-CM | POA: Diagnosis not present

## 2015-01-25 DIAGNOSIS — I87321 Chronic venous hypertension (idiopathic) with inflammation of right lower extremity: Secondary | ICD-10-CM | POA: Diagnosis not present

## 2015-01-25 DIAGNOSIS — I8311 Varicose veins of right lower extremity with inflammation: Secondary | ICD-10-CM | POA: Diagnosis not present

## 2015-01-25 DIAGNOSIS — I87322 Chronic venous hypertension (idiopathic) with inflammation of left lower extremity: Secondary | ICD-10-CM | POA: Diagnosis not present

## 2015-01-31 DIAGNOSIS — Z85828 Personal history of other malignant neoplasm of skin: Secondary | ICD-10-CM | POA: Diagnosis not present

## 2015-01-31 DIAGNOSIS — D692 Other nonthrombocytopenic purpura: Secondary | ICD-10-CM | POA: Diagnosis not present

## 2015-01-31 DIAGNOSIS — L814 Other melanin hyperpigmentation: Secondary | ICD-10-CM | POA: Diagnosis not present

## 2015-01-31 DIAGNOSIS — L821 Other seborrheic keratosis: Secondary | ICD-10-CM | POA: Diagnosis not present

## 2015-01-31 DIAGNOSIS — B351 Tinea unguium: Secondary | ICD-10-CM | POA: Diagnosis not present

## 2015-01-31 DIAGNOSIS — Z85820 Personal history of malignant melanoma of skin: Secondary | ICD-10-CM | POA: Diagnosis not present

## 2015-01-31 DIAGNOSIS — D1801 Hemangioma of skin and subcutaneous tissue: Secondary | ICD-10-CM | POA: Diagnosis not present

## 2015-01-31 DIAGNOSIS — D225 Melanocytic nevi of trunk: Secondary | ICD-10-CM | POA: Diagnosis not present

## 2015-02-02 DIAGNOSIS — J309 Allergic rhinitis, unspecified: Secondary | ICD-10-CM | POA: Diagnosis not present

## 2015-02-05 DIAGNOSIS — K4020 Bilateral inguinal hernia, without obstruction or gangrene, not specified as recurrent: Secondary | ICD-10-CM | POA: Diagnosis not present

## 2015-02-05 DIAGNOSIS — K4090 Unilateral inguinal hernia, without obstruction or gangrene, not specified as recurrent: Secondary | ICD-10-CM | POA: Diagnosis not present

## 2015-02-05 DIAGNOSIS — K4190 Unilateral femoral hernia, without obstruction or gangrene, not specified as recurrent: Secondary | ICD-10-CM | POA: Diagnosis not present

## 2015-02-05 HISTORY — PX: HERNIA REPAIR: SHX51

## 2015-02-13 DIAGNOSIS — J019 Acute sinusitis, unspecified: Secondary | ICD-10-CM | POA: Diagnosis not present

## 2015-02-13 DIAGNOSIS — T63441A Toxic effect of venom of bees, accidental (unintentional), initial encounter: Secondary | ICD-10-CM | POA: Diagnosis not present

## 2015-02-21 DIAGNOSIS — J309 Allergic rhinitis, unspecified: Secondary | ICD-10-CM | POA: Diagnosis not present

## 2015-02-22 ENCOUNTER — Ambulatory Visit: Payer: Medicare Other | Admitting: Obstetrics and Gynecology

## 2015-02-22 DIAGNOSIS — E871 Hypo-osmolality and hyponatremia: Secondary | ICD-10-CM | POA: Diagnosis not present

## 2015-02-22 DIAGNOSIS — M5136 Other intervertebral disc degeneration, lumbar region: Secondary | ICD-10-CM | POA: Diagnosis not present

## 2015-02-22 DIAGNOSIS — H6123 Impacted cerumen, bilateral: Secondary | ICD-10-CM | POA: Diagnosis not present

## 2015-02-22 DIAGNOSIS — J309 Allergic rhinitis, unspecified: Secondary | ICD-10-CM | POA: Diagnosis not present

## 2015-02-22 DIAGNOSIS — I10 Essential (primary) hypertension: Secondary | ICD-10-CM | POA: Diagnosis not present

## 2015-02-22 DIAGNOSIS — I83811 Varicose veins of right lower extremities with pain: Secondary | ICD-10-CM | POA: Diagnosis not present

## 2015-02-27 DIAGNOSIS — J309 Allergic rhinitis, unspecified: Secondary | ICD-10-CM | POA: Diagnosis not present

## 2015-02-27 DIAGNOSIS — T63441A Toxic effect of venom of bees, accidental (unintentional), initial encounter: Secondary | ICD-10-CM | POA: Diagnosis not present

## 2015-03-06 DIAGNOSIS — T63441A Toxic effect of venom of bees, accidental (unintentional), initial encounter: Secondary | ICD-10-CM | POA: Diagnosis not present

## 2015-03-15 ENCOUNTER — Ambulatory Visit (INDEPENDENT_AMBULATORY_CARE_PROVIDER_SITE_OTHER): Payer: Medicare Other | Admitting: Obstetrics and Gynecology

## 2015-03-15 ENCOUNTER — Ambulatory Visit: Payer: Medicare Other | Admitting: Obstetrics and Gynecology

## 2015-03-15 ENCOUNTER — Encounter: Payer: Self-pay | Admitting: Obstetrics and Gynecology

## 2015-03-15 VITALS — BP 130/80 | HR 70 | Resp 14 | Ht 63.0 in | Wt 118.6 lb

## 2015-03-15 DIAGNOSIS — N644 Mastodynia: Secondary | ICD-10-CM

## 2015-03-15 DIAGNOSIS — Z01419 Encounter for gynecological examination (general) (routine) without abnormal findings: Secondary | ICD-10-CM

## 2015-03-15 DIAGNOSIS — M8580 Other specified disorders of bone density and structure, unspecified site: Secondary | ICD-10-CM | POA: Diagnosis not present

## 2015-03-15 DIAGNOSIS — J309 Allergic rhinitis, unspecified: Secondary | ICD-10-CM | POA: Diagnosis not present

## 2015-03-15 NOTE — Progress Notes (Signed)
Patient ID: Teresa Morales, female   DOB: 07-20-1941, 74 y.o.   MRN: [id number] 74 y.o. G1P1001 MarriedCaucasianF here for annual exam.   PCP:  R. Mertha Finders, MD  Using Estrace vaginal cream for atrophy symptoms.  Notes some left nipple pain.   Has osteopenia and has intolerance of bisphosphonates.  Had reflux with Fosamax.  Had joint aching with Actonel. Boniva caused joint aching.  Told to consider Prolia in 2014.  Had umbilical herniorrhaphy in August 2015 and had mesh placed.  Status post bilateral inguinal herniorrhaphies with mesh placement in February 2016.  Anxious to start exercising again.   Patient's last menstrual period was 12/15/1993 (approximate).          Sexually active: Yes.   husband The current method of family planning is post menopausal status.    Exercising: Yes.    walking regularly. Smoker:  no  Health Maintenance: Pap:  02-16-14 wnl:no HPV testing History of abnormal Pap:  Yes, 6/89 Laser vaporization of cervix for mild dysplasia MMG:  03-02-14 heterogeneously dense/scattered calcifications in Rt. Breast--no masses/nl:[facility] Colonoscopy:  2012 benign polyp with Dr. Arvilla Market with [facility].  Next due 2017. BMD:   01/2013 Osteopenia:[facility].  T score of left hip - 2.4, T score or right hip -2.3, T score of spine -1.7.  Intolerance of bisphosphonates. Quit Boniva in 2008 due to muscle aching.  TDaP:  Up to date with PCP Screening Labs:  PCP.    reports that she has never smoked. She has never used smokeless tobacco. She reports that she does not drink alcohol or use illicit drugs.  Past Medical History  Diagnosis Date  . Osteopenia     intolerant of bisphosphonates  . SIADH (syndrome of inappropriate ADH production) 1998  . Endocervical polyp 1997  . Urethral stenosis     dilated  . Mild dysplasia of cervix     laser  . 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  Interstitial cystitis 2004  . GERD (gastroesophageal reflux disease)   . Melanoma of face 04/2007  . Environmental allergies   . Hypertension     Past Surgical History  Procedure Laterality Date  . Hernia repair  5621, 3/0/86    umbilical mesh placed in August 2015.  . Wisdom tooth extraction    . Hernia repair  02-05-15    bilateral inguinal - mesh  . Varicose vein  2016    Current Outpatient Prescriptions  Medication Sig Dispense Refill  . azelastine (ASTELIN) 137 MCG/SPRAY nasal spray Place 1 spray into the nose 2 (two) times daily. Use in each nostril as directed    . Calcium Citrate-Vitamin D (CITRACAL + D PO) Take 2 tablets by mouth 2 (two) times daily.     Marland Kitchen CHERRY PO Take 3 tablets by mouth daily. Tart Cherry Extract    . Cholecalciferol (VITAMIN D PO) Take 1,000 mg by mouth daily.     Marland Kitchen EPIPEN 2-PAK 0.3 MG/0.3ML SOAJ injection as needed.    Marland Kitchen estradiol (ESTRACE) 0.1 MG/GM vaginal cream Use 1/2 g vaginally two or three times per week. 42.5 g 3  . Eszopiclone (LUNESTA PO) Take by mouth at bedtime as needed.     . fexofenadine (ALLEGRA) 180 MG tablet Take 180 mg by mouth daily.    . Lactobacillus Rhamnosus, GG, (CULTURELLE PO) Take by mouth.    . Misc Natural Products (OSTEO BI-FLEX JOINT  SHIELD PO) Take 2 tablets by mouth daily.     . montelukast (SINGULAIR) 10 MG tablet   4  . Multiple Vitamin (MULTIVITAMIN) capsule Take 1 capsule by mouth daily.    Marland Kitchen nystatin-triamcinolone ointment (MYCOLOG) Apply topically 2 (two) times daily. (Patient not taking: Reported on 11/06/2014) 30 g 0  . OMEGA 3 1000 MG CAPS Take 1 capsule by mouth daily.     Marland Kitchen omeprazole (PRILOSEC) 20 MG capsule Take 20 mg by mouth 2 (two) times daily.     . Triamcinolone Acetonide (NASACORT ALLERGY 24HR NA) Place into the nose.    . triamcinolone cream (KENALOG) 0.1 % Apply topically as needed.    . triamterene-hydrochlorothiazide (MAXZIDE-25) 37.5-25 MG per tablet Take 0.5 tablets by mouth daily.    .  valsartan (DIOVAN) 80 MG tablet Take 80 mg by mouth daily.     No current facility-administered medications for this visit.    Family History  Problem Relation Age of Onset  . Other Mother 38    MVA   . Lupus Sister 22  . Prostate cancer Brother   . Hypertension Father   . Osteoporosis Sister   . Thyroid disease Sister   . Scoliosis Sister     ROS:  Pertinent items are noted in HPI.  Otherwise, a comprehensive ROS was negative.  Exam:   BP 130/80 mmHg  Pulse 70  Resp 14  Ht 5\' 3"  (1.6 m)  Wt 118 lb 9.6 oz (53.797 kg)  BMI 21.01 kg/m2  LMP 12/15/1993 (Approximate)  Weight change: @WEIGHTCHANGE @ Height:   Height: 5\' 3"  (160 cm)  Ht Readings from Last 3 Encounters:  03/15/15 5\' 3"  (1.6 m)  11/06/14 5\' 3"  (1.6 m)  10/20/14 5\' 3"  (1.6 m)    General appearance: alert, cooperative and appears stated age Head: Normocephalic, without obvious abnormality, atraumatic Neck: no adenopathy, supple, symmetrical, trachea midline and thyroid normal to inspection and palpation Lungs: clear to auscultation bilaterally Breasts: normal appearance, no masses or tenderness, Inspection negative, No nipple retraction or dimpling, No nipple discharge or bleeding Heart: regular rate and rhythm Abdomen: umbilical incision and infraumbilical incisions, soft, non-tender; bowel sounds normal; no masses,  no organomegaly Extremities: extremities normal, atraumatic, no cyanosis or edema Skin: Skin color, texture, turgor normal. No rashes or lesions Lymph nodes: Cervical, supraclavicular, and axillary nodes normal. No abnormal inguinal nodes palpated Neurologic: Grossly normal   Pelvic: External genitalia:  no lesions              Urethra:  normal appearing urethra with no masses, tenderness or lesions              Bartholins and Skenes: normal                 Vagina: normal appearing vagina with normal color and discharge, no lesions              Cervix: no lesions              Pap taken:  No. Bimanual Exam:  Uterus:  normal size, contour, position, consistency, mobility, non-tender              Adnexa: normal adnexa and no mass, fullness, tenderness               Rectovaginal: Confirms               Anus:  normal sphincter tone, no lesions  Chaperone was present for exam.  A:  Well  Woman with normal exam Left nipple pain.  Normal breast exam.  Osteopenia.  P:   Diagnostic bilateral mammogram and left breast ultrasound. Patient will stop using Estrace vaginal cream until after breast evaluation is completed.   She will then call if she needs refills.  She understand estrogens can be associated with blood clots - DVT, PE, MI, stroke, and breast cancer.  Pap smear not indicated.  B[facility] density is [facility] at [facility] as well.  Discussion regarding osteopenia, calcium, vit D, weight bearing exercise, medication options such as Calcitonin.  return annually or prn

## 2015-03-22 DIAGNOSIS — N644 Mastodynia: Secondary | ICD-10-CM | POA: Diagnosis not present

## 2015-03-22 DIAGNOSIS — M899 Disorder of bone, unspecified: Secondary | ICD-10-CM | POA: Diagnosis not present

## 2015-03-23 DIAGNOSIS — J309 Allergic rhinitis, unspecified: Secondary | ICD-10-CM | POA: Diagnosis not present

## 2015-03-27 DIAGNOSIS — T63441A Toxic effect of venom of bees, accidental (unintentional), initial encounter: Secondary | ICD-10-CM | POA: Diagnosis not present

## 2015-03-29 DIAGNOSIS — J309 Allergic rhinitis, unspecified: Secondary | ICD-10-CM | POA: Diagnosis not present

## 2015-04-02 DIAGNOSIS — T63441A Toxic effect of venom of bees, accidental (unintentional), initial encounter: Secondary | ICD-10-CM | POA: Diagnosis not present

## 2015-04-06 DIAGNOSIS — J309 Allergic rhinitis, unspecified: Secondary | ICD-10-CM | POA: Diagnosis not present

## 2015-04-09 DIAGNOSIS — I8311 Varicose veins of right lower extremity with inflammation: Secondary | ICD-10-CM | POA: Diagnosis not present

## 2015-04-09 DIAGNOSIS — I83811 Varicose veins of right lower extremities with pain: Secondary | ICD-10-CM | POA: Diagnosis not present

## 2015-04-13 DIAGNOSIS — J309 Allergic rhinitis, unspecified: Secondary | ICD-10-CM | POA: Diagnosis not present

## 2015-04-16 ENCOUNTER — Telehealth: Payer: Self-pay

## 2015-04-16 NOTE — Telephone Encounter (Signed)
Called and spoke with patient and reviewed BMD results.   Per Dr. Quincy Simmonds, reported to patient osteopenia to patient compared to 2014 study; spine is slightly decreased and left hip is improved.  Right hip is unchanged.  Overall she is still with risk of fracture and Prolia is an option.  Continue weight bearing exercise, calcium/Vit D. Consult if desired.  Patient will call back if desires consult.  Copy of BMD to be scanned in from [place].

## 2015-04-20 DIAGNOSIS — J309 Allergic rhinitis, unspecified: Secondary | ICD-10-CM | POA: Diagnosis not present

## 2015-04-26 ENCOUNTER — Telehealth: Payer: Self-pay | Admitting: Obstetrics & Gynecology

## 2015-04-26 NOTE — Telephone Encounter (Signed)
silva pt rx refill -pt states waiting on Estrace cream refill based on mmg results -- set up for repeat mmg in 64months  [address] [employer]

## 2015-04-30 DIAGNOSIS — M7981 Nontraumatic hematoma of soft tissue: Secondary | ICD-10-CM | POA: Diagnosis not present

## 2015-04-30 DIAGNOSIS — I83811 Varicose veins of right lower extremities with pain: Secondary | ICD-10-CM | POA: Diagnosis not present

## 2015-05-02 DIAGNOSIS — J309 Allergic rhinitis, unspecified: Secondary | ICD-10-CM | POA: Diagnosis not present

## 2015-05-03 ENCOUNTER — Ambulatory Visit (INDEPENDENT_AMBULATORY_CARE_PROVIDER_SITE_OTHER): Payer: Medicare Other | Admitting: Nurse Practitioner

## 2015-05-03 ENCOUNTER — Encounter: Payer: Self-pay | Admitting: Nurse Practitioner

## 2015-05-03 VITALS — BP 112/74 | HR 60 | Ht 63.0 in | Wt 119.0 lb

## 2015-05-03 DIAGNOSIS — N760 Acute vaginitis: Secondary | ICD-10-CM | POA: Diagnosis not present

## 2015-05-03 DIAGNOSIS — H0012 Chalazion right lower eyelid: Secondary | ICD-10-CM | POA: Diagnosis not present

## 2015-05-03 DIAGNOSIS — J309 Allergic rhinitis, unspecified: Secondary | ICD-10-CM | POA: Diagnosis not present

## 2015-05-03 MED ORDER — METRONIDAZOLE 0.75 % VA GEL: 1.0000 | Freq: Every day | VAGINAL | Status: DC

## 2015-05-03 MED ORDER — FLUCONAZOLE 150 MG PO TABS: 150.0000 mg | ORAL_TABLET | Freq: Once | ORAL | Status: DC

## 2015-05-03 MED ORDER — ESTRADIOL 0.1 MG/GM VA CREA: TOPICAL_CREAM | VAGINAL | Status: DC

## 2015-05-03 NOTE — Patient Instructions (Addendum)
Up to Date information on BV printed.

## 2015-05-03 NOTE — Progress Notes (Signed)
Subjective:     Patient ID: Teresa Morales, female   DOB: 1941/10/03, 74 y.o.   MRN: [id number]  HPI  This 74 yo Female complains of vaginal irritation.  Some burning externally and around the urethra.   Some increase in urination that is normal if she drinks increase amounts of fluids.    No fever and chills. Nystatin and triamcinolone with minimal help yesterday X 1 with some help. Some fatigue on and off.  They are getting ready to go out of town for 4 days and is concerned that would get worse if not treated.     03/15/15 saw Dr. Quincy Simmonds for AEX. With complaints about left breast pain.  The most recent Mammo is normal with possible duct ectasia but recheck  in 6 months.  Recent right eye tear duct infection.   MD wants her to take Amoxil but not sure if she wants to take this if it is going to cause yeast infection.   Review of Systems  Constitutional: Positive for fatigue. Negative for fever and chills.  HENT: Negative.   Respiratory: Negative.   Cardiovascular: Negative.   Gastrointestinal: Negative.  Negative for nausea, vomiting, abdominal pain, diarrhea, constipation, blood in stool and rectal pain.  Endocrine: Negative.   Genitourinary: Positive for frequency, vaginal discharge and vaginal pain. Negative for dysuria, urgency, hematuria, flank pain, decreased urine volume, vaginal bleeding, genital sores, menstrual problem, pelvic pain and dyspareunia.  Musculoskeletal: Negative.   Skin: Negative.   Neurological: Negative.   Psychiatric/Behavioral: Negative.        Objective:   Physical Exam  Constitutional: She appears well-developed and well-nourished. No distress.  Cardiovascular: Normal rate.   Pulmonary/Chest: Effort normal.  Abdominal: Soft. She exhibits no distension. There is no tenderness. There is no rebound.  No flank pain.  Genitourinary:  Vulva areas are red and irritated, very minimal vaginal discharge.   Wet prep: KOH: neg; NSS+ clue; PH: 5.5, atrophic changes  are noted.  Musculoskeletal: Normal range of motion.  Neurological: She is alert.  Psychiatric: She has a normal mood and affect. Her behavior is normal. Judgment and thought content normal.       Assessment:     Right tear duct occlusion BV History of atrophic vaginitis    Plan:     Will start her on Metrogel   She may take Amoxil per Opthalmologist She is given backup Diflucan if needed She request a copy of BMD

## 2015-05-06 NOTE — Progress Notes (Signed)
Encounter reviewed by Dr. Brook Silva.  

## 2015-05-11 DIAGNOSIS — H0012 Chalazion right lower eyelid: Secondary | ICD-10-CM | POA: Diagnosis not present

## 2015-05-11 DIAGNOSIS — J309 Allergic rhinitis, unspecified: Secondary | ICD-10-CM | POA: Diagnosis not present

## 2015-05-18 DIAGNOSIS — J309 Allergic rhinitis, unspecified: Secondary | ICD-10-CM | POA: Diagnosis not present

## 2015-05-21 DIAGNOSIS — T63441A Toxic effect of venom of bees, accidental (unintentional), initial encounter: Secondary | ICD-10-CM | POA: Diagnosis not present

## 2015-05-25 DIAGNOSIS — J309 Allergic rhinitis, unspecified: Secondary | ICD-10-CM | POA: Diagnosis not present

## 2015-06-01 DIAGNOSIS — J309 Allergic rhinitis, unspecified: Secondary | ICD-10-CM | POA: Diagnosis not present

## 2015-06-07 ENCOUNTER — Encounter: Payer: Self-pay | Admitting: Nurse Practitioner

## 2015-06-07 ENCOUNTER — Ambulatory Visit (INDEPENDENT_AMBULATORY_CARE_PROVIDER_SITE_OTHER): Payer: Medicare Other | Admitting: Nurse Practitioner

## 2015-06-07 VITALS — BP 124/80 | HR 70 | Resp 16 | Ht 63.0 in | Wt 120.0 lb

## 2015-06-07 DIAGNOSIS — N760 Acute vaginitis: Secondary | ICD-10-CM

## 2015-06-07 DIAGNOSIS — R8290 Unspecified abnormal findings in urine: Secondary | ICD-10-CM | POA: Diagnosis not present

## 2015-06-07 LAB — POCT URINALYSIS DIPSTICK
BILIRUBIN UA: NEGATIVE
Glucose, UA: NEGATIVE
KETONES UA: NEGATIVE
Leukocytes, UA: NEGATIVE
Nitrite, UA: NEGATIVE
Protein, UA: NEGATIVE
RBC UA: NEGATIVE
Urobilinogen, UA: NEGATIVE
pH, UA: 5

## 2015-06-07 NOTE — Progress Notes (Signed)
74 y.o.Married white female G1P1 here with complaint of vaginal symptoms of irritation, burning, in the vulvar areas.    Describes no discharge.  She was seen a month ago and was treated for BV with Metrogel.  She had some cream left over and used that again topically a few times she had a back up of Diflucan and used 1 Diflucan last week and repeated X 1 with some help but not completely better.   Not itching,  used Triamcinolone and Nystatin X 2 with some relief.  She does use Premarin vaginal cream twice weekly.   Denies new personal products or vaginal dryness. No STD concerns. Slight pelvic pressure but very little Urinary symptoms.    O:Healthy female WDWN Affect: normal, orientation x 3  Exam:  Alert, oriented Abdomen:soft and non tender Lymph node: no enlargement or tenderness Pelvic exam: External genital: normal female BUS: redness and irritation with red vulva on the left Vagina: very thin clear discharge noted.  Cervix: normal, non tender, no CMT Uterus: normal, non tender Adnexa:normal, non tender, no masses or fullness noted  Affirm test to the lab: Poct urine-neg  A: Vaginitis   P:  Discussed findings of vulvar irritation and etiology. Discussed Aveeno or baking soda sitz bath for comfort. Avoid moist clothes or pads for extended period of time. If working out in gym clothes or swim suits for long periods of time change underwear or bottoms of swimsuit if possible. Olive Oil/Coconut Oil use for skin protection prior to activity can be used to external skin. Rx: Affirm is sent to lab.  RV prn

## 2015-06-07 NOTE — Patient Instructions (Signed)
Will call with test results

## 2015-06-08 ENCOUNTER — Other Ambulatory Visit: Payer: Self-pay | Admitting: Nurse Practitioner

## 2015-06-08 ENCOUNTER — Telehealth: Payer: Self-pay

## 2015-06-08 DIAGNOSIS — J309 Allergic rhinitis, unspecified: Secondary | ICD-10-CM | POA: Diagnosis not present

## 2015-06-08 LAB — WET PREP BY MOLECULAR PROBE
CANDIDA SPECIES: NEGATIVE
Gardnerella vaginalis: POSITIVE — AB
Trichomonas vaginosis: NEGATIVE

## 2015-06-08 MED ORDER — METRONIDAZOLE 0.75 % VA GEL: 1.0000 | Freq: Every day | VAGINAL | Status: DC

## 2015-06-08 MED ORDER — FLUCONAZOLE 150 MG PO TABS: 150.0000 mg | ORAL_TABLET | Freq: Once | ORAL | Status: DC

## 2015-06-08 NOTE — Telephone Encounter (Signed)
Spoke with patient. Advised of results as seen below from [place] Cage, [place]. Patient is agreeable and verbalizes understanding.  Routing to provider for final review. Patient agreeable to disposition. Will close encounter.

## 2015-06-08 NOTE — Telephone Encounter (Signed)
-----   Message from Kem Boroughs, [place] sent at 06/08/2015  8:54 AM EDT ----- Please let patient know that Affirm test was positive for BV.  Rx will be sent to pharmacy.  She has some left over at home but sure if enough.  A back up of Diflucan will also be sent to use if needed after the Metrogel.

## 2015-06-09 NOTE — Progress Notes (Signed)
Encounter reviewed by Dr. Brook Silva.  

## 2015-06-19 DIAGNOSIS — T63441A Toxic effect of venom of bees, accidental (unintentional), initial encounter: Secondary | ICD-10-CM | POA: Diagnosis not present

## 2015-06-22 DIAGNOSIS — J309 Allergic rhinitis, unspecified: Secondary | ICD-10-CM | POA: Diagnosis not present

## 2015-06-22 DIAGNOSIS — Z85828 Personal history of other malignant neoplasm of skin: Secondary | ICD-10-CM | POA: Diagnosis not present

## 2015-06-22 DIAGNOSIS — L720 Epidermal cyst: Secondary | ICD-10-CM | POA: Diagnosis not present

## 2015-06-22 DIAGNOSIS — D485 Neoplasm of uncertain behavior of skin: Secondary | ICD-10-CM | POA: Diagnosis not present

## 2015-06-29 DIAGNOSIS — J309 Allergic rhinitis, unspecified: Secondary | ICD-10-CM | POA: Diagnosis not present

## 2015-07-04 DIAGNOSIS — M79643 Pain in unspecified hand: Secondary | ICD-10-CM | POA: Diagnosis not present

## 2015-07-04 DIAGNOSIS — I83811 Varicose veins of right lower extremities with pain: Secondary | ICD-10-CM | POA: Diagnosis not present

## 2015-07-04 DIAGNOSIS — E559 Vitamin D deficiency, unspecified: Secondary | ICD-10-CM | POA: Diagnosis not present

## 2015-07-04 DIAGNOSIS — I10 Essential (primary) hypertension: Secondary | ICD-10-CM | POA: Diagnosis not present

## 2015-07-04 DIAGNOSIS — M5136 Other intervertebral disc degeneration, lumbar region: Secondary | ICD-10-CM | POA: Diagnosis not present

## 2015-07-04 DIAGNOSIS — J309 Allergic rhinitis, unspecified: Secondary | ICD-10-CM | POA: Diagnosis not present

## 2015-07-04 DIAGNOSIS — Z1389 Encounter for screening for other disorder: Secondary | ICD-10-CM | POA: Diagnosis not present

## 2015-07-04 DIAGNOSIS — Z0001 Encounter for general adult medical examination with abnormal findings: Secondary | ICD-10-CM | POA: Diagnosis not present

## 2015-07-04 DIAGNOSIS — E871 Hypo-osmolality and hyponatremia: Secondary | ICD-10-CM | POA: Diagnosis not present

## 2015-07-06 DIAGNOSIS — J309 Allergic rhinitis, unspecified: Secondary | ICD-10-CM | POA: Diagnosis not present

## 2015-07-19 DIAGNOSIS — J309 Allergic rhinitis, unspecified: Secondary | ICD-10-CM | POA: Diagnosis not present

## 2015-07-25 DIAGNOSIS — J309 Allergic rhinitis, unspecified: Secondary | ICD-10-CM | POA: Diagnosis not present

## 2015-07-26 DIAGNOSIS — J309 Allergic rhinitis, unspecified: Secondary | ICD-10-CM | POA: Diagnosis not present

## 2015-07-31 DIAGNOSIS — I8311 Varicose veins of right lower extremity with inflammation: Secondary | ICD-10-CM | POA: Diagnosis not present

## 2015-08-01 DIAGNOSIS — L814 Other melanin hyperpigmentation: Secondary | ICD-10-CM | POA: Diagnosis not present

## 2015-08-01 DIAGNOSIS — D2271 Melanocytic nevi of right lower limb, including hip: Secondary | ICD-10-CM | POA: Diagnosis not present

## 2015-08-01 DIAGNOSIS — D485 Neoplasm of uncertain behavior of skin: Secondary | ICD-10-CM | POA: Diagnosis not present

## 2015-08-01 DIAGNOSIS — Z85828 Personal history of other malignant neoplasm of skin: Secondary | ICD-10-CM | POA: Diagnosis not present

## 2015-08-01 DIAGNOSIS — L739 Follicular disorder, unspecified: Secondary | ICD-10-CM | POA: Diagnosis not present

## 2015-08-01 DIAGNOSIS — L821 Other seborrheic keratosis: Secondary | ICD-10-CM | POA: Diagnosis not present

## 2015-08-01 DIAGNOSIS — L570 Actinic keratosis: Secondary | ICD-10-CM | POA: Diagnosis not present

## 2015-08-01 DIAGNOSIS — D1801 Hemangioma of skin and subcutaneous tissue: Secondary | ICD-10-CM | POA: Diagnosis not present

## 2015-08-01 DIAGNOSIS — D1800 Hemangioma unspecified site: Secondary | ICD-10-CM | POA: Diagnosis not present

## 2015-08-01 DIAGNOSIS — L905 Scar conditions and fibrosis of skin: Secondary | ICD-10-CM | POA: Diagnosis not present

## 2015-08-01 DIAGNOSIS — D692 Other nonthrombocytopenic purpura: Secondary | ICD-10-CM | POA: Diagnosis not present

## 2015-08-01 DIAGNOSIS — J309 Allergic rhinitis, unspecified: Secondary | ICD-10-CM | POA: Diagnosis not present

## 2015-08-01 DIAGNOSIS — Z85820 Personal history of malignant melanoma of skin: Secondary | ICD-10-CM | POA: Diagnosis not present

## 2015-08-03 DIAGNOSIS — J309 Allergic rhinitis, unspecified: Secondary | ICD-10-CM | POA: Diagnosis not present

## 2015-08-14 DIAGNOSIS — M1812 Unilateral primary osteoarthritis of first carpometacarpal joint, left hand: Secondary | ICD-10-CM | POA: Diagnosis not present

## 2015-08-14 DIAGNOSIS — M67441 Ganglion, right hand: Secondary | ICD-10-CM | POA: Diagnosis not present

## 2015-08-14 DIAGNOSIS — J309 Allergic rhinitis, unspecified: Secondary | ICD-10-CM | POA: Diagnosis not present

## 2015-08-22 DIAGNOSIS — J309 Allergic rhinitis, unspecified: Secondary | ICD-10-CM | POA: Diagnosis not present

## 2015-08-25 DIAGNOSIS — J309 Allergic rhinitis, unspecified: Secondary | ICD-10-CM | POA: Insufficient documentation

## 2015-08-29 DIAGNOSIS — I999 Unspecified disorder of circulatory system: Secondary | ICD-10-CM | POA: Diagnosis not present

## 2015-08-29 DIAGNOSIS — Q279 Congenital malformation of peripheral vascular system, unspecified: Secondary | ICD-10-CM | POA: Diagnosis not present

## 2015-08-29 DIAGNOSIS — R2231 Localized swelling, mass and lump, right upper limb: Secondary | ICD-10-CM | POA: Diagnosis not present

## 2015-09-04 DIAGNOSIS — J309 Allergic rhinitis, unspecified: Secondary | ICD-10-CM | POA: Diagnosis not present

## 2015-09-07 DIAGNOSIS — Z4789 Encounter for other orthopedic aftercare: Secondary | ICD-10-CM | POA: Diagnosis not present

## 2015-09-07 DIAGNOSIS — M67441 Ganglion, right hand: Secondary | ICD-10-CM | POA: Diagnosis not present

## 2015-09-12 ENCOUNTER — Ambulatory Visit (INDEPENDENT_AMBULATORY_CARE_PROVIDER_SITE_OTHER): Payer: Medicare Other

## 2015-09-12 ENCOUNTER — Other Ambulatory Visit: Payer: Self-pay | Admitting: Gastroenterology

## 2015-09-12 DIAGNOSIS — J309 Allergic rhinitis, unspecified: Secondary | ICD-10-CM | POA: Diagnosis not present

## 2015-09-13 ENCOUNTER — Ambulatory Visit (INDEPENDENT_AMBULATORY_CARE_PROVIDER_SITE_OTHER): Payer: Medicare Other | Admitting: Certified Nurse Midwife

## 2015-09-13 ENCOUNTER — Encounter: Payer: Self-pay | Admitting: Certified Nurse Midwife

## 2015-09-13 VITALS — BP 122/78 | HR 74 | Temp 97.3°F | Resp 16 | Ht 63.0 in | Wt 120.0 lb

## 2015-09-13 DIAGNOSIS — N952 Postmenopausal atrophic vaginitis: Secondary | ICD-10-CM | POA: Diagnosis not present

## 2015-09-13 DIAGNOSIS — R350 Frequency of micturition: Secondary | ICD-10-CM | POA: Diagnosis not present

## 2015-09-13 DIAGNOSIS — L293 Anogenital pruritus, unspecified: Secondary | ICD-10-CM | POA: Diagnosis not present

## 2015-09-13 DIAGNOSIS — N760 Acute vaginitis: Secondary | ICD-10-CM | POA: Diagnosis not present

## 2015-09-13 DIAGNOSIS — L298 Other pruritus: Secondary | ICD-10-CM

## 2015-09-13 DIAGNOSIS — N898 Other specified noninflammatory disorders of vagina: Secondary | ICD-10-CM

## 2015-09-13 LAB — POCT URINALYSIS DIPSTICK
Bilirubin, UA: NEGATIVE
Glucose, UA: NEGATIVE
Ketones, UA: NEGATIVE
LEUKOCYTES UA: NEGATIVE
NITRITE UA: NEGATIVE
PH UA: 5
PROTEIN UA: NEGATIVE
RBC UA: NEGATIVE
UROBILINOGEN UA: NEGATIVE

## 2015-09-13 NOTE — Patient Instructions (Signed)

## 2015-09-13 NOTE — Progress Notes (Signed)
Encounter reviewed Jill Jertson, MD   

## 2015-09-13 NOTE — Progress Notes (Signed)
74 y.o.Married  g1p1001 here with complaint of vaginal symptoms of itching,slight burning, and no increase discharge. Uses Estrace cream twice weekly for dryness. Onset of symptoms 4-5 days ago. Denies new personal products.. Urinary symptoms slight frequency, denies urgency or pain with urination or odor with urine. Menopausal no HRT. Denies vaginal bleeding. Recent surgery on left finger for cyst, no antibiotics given. Sexually active, no issues. Recent lab work with PCP all normal. No other health concerns today.  O:Healthy female WDWN Affect: normal, orientation x 3  Exam:Skin: warm and dry CVAT: bilateral negative Abdomen: non tender, soft, negative suprapubic Lymph node: no enlargement or tenderness Pelvic exam: External genital: normal female, atrophic appearance, no scaling or lesions or exudate BUS: negative Bladder non tender, Urethral meatus no redness or tenderness Vagina: atrophic with scant moisture, slight odor noted.   Affirm taken Cervix: normal, non tender, no CMT Uterus: normal, non tender Adnexa:normal, non tender, no masses or fullness noted Perineal area: normal  Wet Prep results: Poct urine-neg  A:Normal pelvic exam Atrophic Vaginitis R/O vaginal infection    P:Discussed findings of atrophic vaginitis and etiology. Discussed Aveeno or baking soda sitz bath for comfort. Avoid moist clothes or pads for extended period of time. Coconut Oil use for skin protection prior to activity can be used to external skin. Discussed continue with Estrace cream 2 x weekly. Use coconut oil the other days to maintain moisture externally and internally. Instructions given. Questions addressed. Will treat per affirm results if indicated.  Recent labs with PCP Vitamin D WNL, with lab sheet patient had with her.   Rv prn

## 2015-09-14 LAB — WET PREP BY MOLECULAR PROBE
Candida species: NEGATIVE
GARDNERELLA VAGINALIS: NEGATIVE
Trichomonas vaginosis: NEGATIVE

## 2015-09-18 ENCOUNTER — Other Ambulatory Visit: Payer: Self-pay | Admitting: *Deleted

## 2015-09-18 MED ORDER — MONTELUKAST SODIUM 10 MG PO TABS: 10.0000 mg | ORAL_TABLET | Freq: Every day | ORAL | Status: DC

## 2015-09-19 ENCOUNTER — Ambulatory Visit (INDEPENDENT_AMBULATORY_CARE_PROVIDER_SITE_OTHER): Payer: Medicare Other

## 2015-09-19 DIAGNOSIS — J301 Allergic rhinitis due to pollen: Secondary | ICD-10-CM | POA: Diagnosis not present

## 2015-09-24 ENCOUNTER — Encounter (HOSPITAL_COMMUNITY): Payer: Self-pay | Admitting: *Deleted

## 2015-09-26 ENCOUNTER — Other Ambulatory Visit: Payer: Self-pay | Admitting: Gastroenterology

## 2015-10-02 ENCOUNTER — Ambulatory Visit (HOSPITAL_COMMUNITY)
Admission: RE | Admit: 2015-10-02 | Discharge: 2015-10-02 | Disposition: A | Discharge status: Home / Self Care | Payer: Medicare Other | Source: Ambulatory Visit | Attending: Gastroenterology | Admitting: Gastroenterology

## 2015-10-02 ENCOUNTER — Ambulatory Visit (HOSPITAL_COMMUNITY): Payer: Medicare Other | Admitting: Anesthesiology

## 2015-10-02 ENCOUNTER — Encounter (HOSPITAL_COMMUNITY)
Admission: RE | Disposition: A | Discharge status: Home / Self Care | Payer: Self-pay | Source: Ambulatory Visit | Attending: Gastroenterology

## 2015-10-02 ENCOUNTER — Encounter (HOSPITAL_COMMUNITY): Payer: Self-pay | Admitting: Anesthesiology

## 2015-10-02 DIAGNOSIS — K219 Gastro-esophageal reflux disease without esophagitis: Secondary | ICD-10-CM | POA: Insufficient documentation

## 2015-10-02 DIAGNOSIS — Z79899 Other long term (current) drug therapy: Secondary | ICD-10-CM | POA: Diagnosis not present

## 2015-10-02 DIAGNOSIS — I7300 Raynaud's syndrome without gangrene: Secondary | ICD-10-CM | POA: Insufficient documentation

## 2015-10-02 DIAGNOSIS — I10 Essential (primary) hypertension: Secondary | ICD-10-CM | POA: Insufficient documentation

## 2015-10-02 DIAGNOSIS — Z1211 Encounter for screening for malignant neoplasm of colon: Secondary | ICD-10-CM | POA: Insufficient documentation

## 2015-10-02 DIAGNOSIS — Z86010 Personal history of colonic polyps: Secondary | ICD-10-CM | POA: Insufficient documentation

## 2015-10-02 DIAGNOSIS — J45909 Unspecified asthma, uncomplicated: Secondary | ICD-10-CM | POA: Insufficient documentation

## 2015-10-02 HISTORY — DX: Methicillin resistant Staphylococcus aureus infection, unspecified site: A49.02

## 2015-10-02 HISTORY — PX: COLONOSCOPY WITH PROPOFOL: SHX5780

## 2015-10-02 SURGERY — COLONOSCOPY WITH PROPOFOL
Anesthesia: Monitor Anesthesia Care

## 2015-10-02 MED ORDER — PROPOFOL 500 MG/50ML IV EMUL
INTRAVENOUS | Status: DC | PRN
  Administered 2015-10-02: 250 ug/kg/min via INTRAVENOUS

## 2015-10-02 MED ORDER — PROPOFOL 500 MG/50ML IV EMUL
INTRAVENOUS | Status: DC | PRN
  Administered 2015-10-02: 30 mg via INTRAVENOUS

## 2015-10-02 MED ORDER — SODIUM CHLORIDE 0.9 % IV SOLN: INTRAVENOUS | Status: DC

## 2015-10-02 MED ORDER — LACTATED RINGERS IV SOLN
INTRAVENOUS | Status: DC | PRN
  Administered 2015-10-02: 1000 mL
  Administered 2015-10-02: 10:00:00 via INTRAVENOUS

## 2015-10-02 MED ORDER — PROPOFOL 10 MG/ML IV BOLUS
INTRAVENOUS | Status: AC
  Filled 2015-10-02: qty 20

## 2015-10-02 MED ORDER — LACTATED RINGERS IV SOLN: Freq: Once | INTRAVENOUS | Status: DC

## 2015-10-02 SURGICAL SUPPLY — 22 items

## 2015-10-02 NOTE — Anesthesia Preprocedure Evaluation (Addendum)
Anesthesia Evaluation  Patient identified by MRN, date of birth, ID band Patient awake    Reviewed: Allergy & Precautions, NPO status , Patient's Chart, lab work & pertinent test results  Airway Mallampati: I  TM Distance: >3 FB Neck ROM: Full    Dental   Pulmonary    Pulmonary exam normal        Cardiovascular hypertension, Pt. on medications Normal cardiovascular exam     Neuro/Psych    GI/Hepatic GERD  Medicated and Controlled,  Endo/Other    Renal/GU      Musculoskeletal   Abdominal   Peds  Hematology   Anesthesia Other Findings   Reproductive/Obstetrics                            Anesthesia Physical Anesthesia Plan  ASA: II  Anesthesia Plan: MAC   Post-op Pain Management:    Induction: Intravenous  Airway Management Planned: Simple Face Mask  Additional Equipment:   Intra-op Plan:   Post-operative Plan:   Informed Consent: I have reviewed the patients History and Physical, chart, labs and discussed the procedure including the risks, benefits and alternatives for the proposed anesthesia with the patient or authorized representative who has indicated his/her understanding and acceptance.   Dental advisory given  Plan Discussed with: CRNA and Surgeon  Anesthesia Plan Comments:        Anesthesia Quick Evaluation

## 2015-10-02 NOTE — Discharge Instructions (Signed)

## 2015-10-02 NOTE — Op Note (Signed)
Procedure: Surveillance colonoscopy. Normal surveillance colonoscopy performed on 05/07/2010. Adenomatous colon polyp removed colonoscopically in 2003.  Endoscopist: Earle Gell  Premedication: Propofol administered by anesthesia  Procedure: The patient was placed in the left lateral decubitus position. Anal inspection and digital rectal exam were normal. The Pentax pediatric colonoscope was introduced into the rectum and advanced to the cecum. A normal-appearing appendiceal orifice and ileocecal valve were identified. Colonic preparation for the exam today was good. Withdrawal time was 8 minutes  Rectum. Normal. Retroflex view of the distal rectum was normal  Sigmoid colon and descending colon. Normal  Splenic flexure. Normal  Transverse colon. Normal  Hepatic flexure. Normal  Ascending colon. Normal  Cecum and ileocecal valve. Normal  Assessment: Normal screening colonoscopy

## 2015-10-02 NOTE — H&P (Signed)
  Procedure: Surveillance colonoscopy. Normal surveillance colonoscopy performed on 05/07/2010. History of adenomatous colon polyp removed colonoscopically in 2003.  History: The patient is a 74 year old female born 15-Nov-1941. She is scheduled to undergo a surveillance colonoscopy today.  Past medical history: Allergic rhinitis. Allergic bronchitis. Raynaud's phenomenon. Hypertension. Chronic urethral stricture. Osteoporosis. Osteoarthritis. Melanoma removed from the right face in 2007. Hemorrhoidectomy. Uterine polyp removed surgically. Ventral hernia repair. There is pain surgery. Umbilical herniorrhaphy with mesh. Bilateral inguinal hernia repairs.  Exam: The patient is alert and lying comfortably on the endoscopy stretcher. Abdomen is soft and nontender to palpation. Cardiac exam reveals a regular rhythm. Lungs are clear to auscultation.  Plan: Proceed with surveillance colonoscopy

## 2015-10-02 NOTE — Transfer of Care (Signed)
Immediate Anesthesia Transfer of Care Note  Patient: Teresa Morales  Procedure(s) Performed: Procedure(s): COLONOSCOPY WITH PROPOFOL (N/A)  Patient Location: PACU  Anesthesia Type:MAC  Level of Consciousness:  sedated, patient cooperative and responds to stimulation  Airway & Oxygen Therapy:Patient Spontanous Breathing and Patient connected to face mask oxgen  Post-op Assessment:  Report given to PACU RN and Post -op Vital signs reviewed and stable  Post vital signs:  Reviewed and stable  Last Vitals:  Filed Vitals:   10/02/15 1005  BP: 177/87  Temp: 36.7 C  Resp: 11    Complications: No apparent anesthesia complications

## 2015-10-02 NOTE — Anesthesia Postprocedure Evaluation (Signed)
Anesthesia Post Note  Patient: Teresa Morales  Procedure(s) Performed: Procedure(s) (LRB): COLONOSCOPY WITH PROPOFOL (N/A)  Anesthesia type: MAC  Patient location: PACU  Post pain: Pain level controlled  Post assessment: Patient's Cardiovascular Status Stable  Last Vitals:  Filed Vitals:   10/02/15 1130  BP: 170/100  Pulse: 64  Temp:   Resp: 15    Post vital signs: Reviewed and stable  Level of consciousness: sedated  Complications: No apparent anesthesia complications

## 2015-10-03 ENCOUNTER — Other Ambulatory Visit: Payer: Self-pay | Admitting: Allergy and Immunology

## 2015-10-03 ENCOUNTER — Encounter (HOSPITAL_COMMUNITY): Payer: Self-pay | Admitting: Gastroenterology

## 2015-10-03 MED ORDER — LEVOCETIRIZINE DIHYDROCHLORIDE 5 MG PO TABS: 5.0000 mg | ORAL_TABLET | Freq: Every day | ORAL | Status: DC

## 2015-10-04 DIAGNOSIS — M67441 Ganglion, right hand: Secondary | ICD-10-CM | POA: Diagnosis not present

## 2015-10-04 DIAGNOSIS — Z4789 Encounter for other orthopedic aftercare: Secondary | ICD-10-CM | POA: Diagnosis not present

## 2015-10-08 ENCOUNTER — Telehealth: Payer: Self-pay | Admitting: Obstetrics and Gynecology

## 2015-10-08 NOTE — Telephone Encounter (Signed)
Patient called and said, "I need an order sent to [facility] for a follow up left breast ultrasound I scheduled for 10/11/15."

## 2015-10-08 NOTE — Telephone Encounter (Signed)
Order faxed to [facility] with cover sheet and confirmation. Spoke with patient's husband Teresa Morales, okay per ROI as patient was not home. Advised we have faxed the order for patient's appointment with [facility] on 10/27. Advised to have patient return call with any further questions.  Will close encounter.

## 2015-10-08 NOTE — Telephone Encounter (Signed)
Order signed.  Ok to close encounter.

## 2015-10-08 NOTE — Telephone Encounter (Signed)
Order for follow up left breast ultrasound to Dr.Miller's desk for review and signature before faxing as Dr.Silva is out of the office.

## 2015-10-09 ENCOUNTER — Ambulatory Visit (INDEPENDENT_AMBULATORY_CARE_PROVIDER_SITE_OTHER): Payer: Medicare Other

## 2015-10-09 DIAGNOSIS — J301 Allergic rhinitis due to pollen: Secondary | ICD-10-CM | POA: Diagnosis not present

## 2015-10-11 DIAGNOSIS — N6042 Mammary duct ectasia of left breast: Secondary | ICD-10-CM | POA: Diagnosis not present

## 2015-10-19 ENCOUNTER — Ambulatory Visit (INDEPENDENT_AMBULATORY_CARE_PROVIDER_SITE_OTHER): Payer: Medicare Other | Admitting: *Deleted

## 2015-10-19 DIAGNOSIS — J301 Allergic rhinitis due to pollen: Secondary | ICD-10-CM | POA: Diagnosis not present

## 2015-10-22 NOTE — Progress Notes (Signed)
This encounter was created in error - please disregard.

## 2015-10-25 DIAGNOSIS — M1812 Unilateral primary osteoarthritis of first carpometacarpal joint, left hand: Secondary | ICD-10-CM | POA: Diagnosis not present

## 2015-10-25 DIAGNOSIS — M67441 Ganglion, right hand: Secondary | ICD-10-CM | POA: Diagnosis not present

## 2015-10-29 ENCOUNTER — Ambulatory Visit (INDEPENDENT_AMBULATORY_CARE_PROVIDER_SITE_OTHER): Payer: Medicare Other

## 2015-10-29 DIAGNOSIS — T63441D Toxic effect of venom of bees, accidental (unintentional), subsequent encounter: Secondary | ICD-10-CM | POA: Diagnosis not present

## 2015-10-30 ENCOUNTER — Other Ambulatory Visit: Payer: Self-pay | Admitting: *Deleted

## 2015-10-30 MED ORDER — LEVOCETIRIZINE DIHYDROCHLORIDE 5 MG PO TABS: 5.0000 mg | ORAL_TABLET | Freq: Every day | ORAL | Status: DC

## 2015-11-02 ENCOUNTER — Ambulatory Visit (INDEPENDENT_AMBULATORY_CARE_PROVIDER_SITE_OTHER): Payer: Medicare Other | Admitting: *Deleted

## 2015-11-02 DIAGNOSIS — J301 Allergic rhinitis due to pollen: Secondary | ICD-10-CM | POA: Diagnosis not present

## 2015-11-13 ENCOUNTER — Ambulatory Visit (INDEPENDENT_AMBULATORY_CARE_PROVIDER_SITE_OTHER): Payer: Medicare Other

## 2015-11-13 DIAGNOSIS — J301 Allergic rhinitis due to pollen: Secondary | ICD-10-CM

## 2015-11-15 ENCOUNTER — Other Ambulatory Visit: Payer: Self-pay | Admitting: Gastroenterology

## 2015-11-15 DIAGNOSIS — IMO0001 Reserved for inherently not codable concepts without codable children: Secondary | ICD-10-CM

## 2015-11-15 DIAGNOSIS — K219 Gastro-esophageal reflux disease without esophagitis: Secondary | ICD-10-CM | POA: Diagnosis not present

## 2015-11-17 DIAGNOSIS — Z23 Encounter for immunization: Secondary | ICD-10-CM | POA: Diagnosis not present

## 2015-11-19 ENCOUNTER — Inpatient Hospital Stay: Admission: RE | Admit: 2015-11-19 | Payer: Medicare Other | Source: Ambulatory Visit

## 2015-11-22 ENCOUNTER — Ambulatory Visit (INDEPENDENT_AMBULATORY_CARE_PROVIDER_SITE_OTHER): Payer: Medicare Other

## 2015-11-22 ENCOUNTER — Ambulatory Visit
Admission: RE | Admit: 2015-11-22 | Discharge: 2015-11-22 | Disposition: A | Discharge status: Home / Self Care | Payer: Medicare Other | Source: Ambulatory Visit | Attending: Gastroenterology | Admitting: Gastroenterology

## 2015-11-22 DIAGNOSIS — K219 Gastro-esophageal reflux disease without esophagitis: Principal | ICD-10-CM

## 2015-11-22 DIAGNOSIS — R1310 Dysphagia, unspecified: Secondary | ICD-10-CM | POA: Diagnosis not present

## 2015-11-22 DIAGNOSIS — J301 Allergic rhinitis due to pollen: Secondary | ICD-10-CM | POA: Diagnosis not present

## 2015-11-22 DIAGNOSIS — IMO0001 Reserved for inherently not codable concepts without codable children: Secondary | ICD-10-CM

## 2015-11-30 DIAGNOSIS — M1812 Unilateral primary osteoarthritis of first carpometacarpal joint, left hand: Secondary | ICD-10-CM | POA: Diagnosis not present

## 2015-11-30 DIAGNOSIS — M67441 Ganglion, right hand: Secondary | ICD-10-CM | POA: Diagnosis not present

## 2015-11-30 DIAGNOSIS — J3089 Other allergic rhinitis: Secondary | ICD-10-CM | POA: Diagnosis not present

## 2015-12-03 ENCOUNTER — Ambulatory Visit (INDEPENDENT_AMBULATORY_CARE_PROVIDER_SITE_OTHER): Payer: Medicare Other | Admitting: Neurology

## 2015-12-03 DIAGNOSIS — J309 Allergic rhinitis, unspecified: Secondary | ICD-10-CM

## 2015-12-12 ENCOUNTER — Encounter: Payer: Self-pay | Admitting: Certified Nurse Midwife

## 2015-12-12 ENCOUNTER — Ambulatory Visit (INDEPENDENT_AMBULATORY_CARE_PROVIDER_SITE_OTHER): Payer: Medicare Other | Admitting: Certified Nurse Midwife

## 2015-12-12 VITALS — BP 110/72 | HR 72 | Resp 16 | Ht 63.0 in | Wt 121.0 lb

## 2015-12-12 DIAGNOSIS — N9489 Other specified conditions associated with female genital organs and menstrual cycle: Secondary | ICD-10-CM

## 2015-12-12 DIAGNOSIS — N898 Other specified noninflammatory disorders of vagina: Secondary | ICD-10-CM | POA: Diagnosis not present

## 2015-12-12 DIAGNOSIS — N952 Postmenopausal atrophic vaginitis: Secondary | ICD-10-CM

## 2015-12-12 DIAGNOSIS — N949 Unspecified condition associated with female genital organs and menstrual cycle: Secondary | ICD-10-CM | POA: Diagnosis not present

## 2015-12-12 DIAGNOSIS — N760 Acute vaginitis: Secondary | ICD-10-CM | POA: Diagnosis not present

## 2015-12-12 MED ORDER — NYSTATIN-TRIAMCINOLONE 100000-0.1 UNIT/GM-% EX CREA: 1.0000 "application " | TOPICAL_CREAM | Freq: Two times a day (BID) | CUTANEOUS | Status: DC

## 2015-12-12 NOTE — Patient Instructions (Signed)
Atrophic Vaginitis Atrophic vaginitis is when the tissues that line the vagina become dry and thin. This is caused by a drop in estrogen. Estrogen helps:  To keep the vagina moist.  To make a clear fluid that helps:  To lubricate the vagina for sex.  To protect the vagina from infection. If the lining of the vagina is dry and thin, it may:  Make sex painful. It may also cause bleeding.  Cause a feeling of:  Burning.  Irritation.  Itchiness.  Make an exam of your vagina painful. It may also cause bleeding.  Make you lose interest in sex.  Cause a burning feeling when you pee.  Make your vaginal fluid (discharge) brown or yellow. For some women, there are no symptoms. This condition is most common in women who do not get their regular menstrual periods anymore (menopause). This often starts when a woman is 45-55 years old. HOME CARE  Take medicines only as told by your doctor. Do not use any herbal or alternative medicines unless your doctor says it is okay.  Use over-the-counter products for dryness only as told by your doctor. These include:  Creams.  Lubricants.  Moisturizers.  Do not douche.  Do not use products that can make your vagina dry. These include:  Scented feminine sprays.  Scented tampons.  Scented soaps.  If it hurts to have sex, tell your sexual partner. GET HELP IF:  Your discharge looks different than normal.  Your vagina has an unusual smell.  You have new symptoms.  Your symptoms do not get better with treatment.  Your symptoms get worse.   This information is not intended to replace advice given to you by your health care provider. Make sure you discuss any questions you have with your health care provider.   Document Released: 05/19/2008 Document Revised: 04/17/2015 Document Reviewed: 11/22/2014 Elsevier Interactive Patient Education 2016 [employer] Inc[employer]  

## 2015-12-12 NOTE — Progress Notes (Signed)
74 yo  g1p1001 here with complaint of vaginal symptoms of irritation with some burning and pain in vagina. Describes discharge as scant to none. Treating self with coconut oil and estrace cream for vaginal dryness which has been helping. Patient tried som Mycolog cream and this helped external itching only. Denies vaginal bleeding. Not recently sexually active or stimulation. Onset of symptoms 3-4 days ago. Denies new personal products. Urinary symptoms none . Menopausal no other HRT. Has been very busy with the holiday season. Has been wearing a pad most days due to schedule. Has arthritis in left hand so hard to use coconut oil sometimes.  Needs refill on Mycolog for prn use of external itching with pad use. No other health issues today.   O:Healthy female WDWN Affect: normal, orientation x 3  Exam:Skin warm and dry Abdomen:soft, non tender, negative suprapubic  Inguinal Lymph node: no enlargement or tenderness Pelvic exam: External genital: normal female,atrophic BUS: negative Vagina: atrophic appearance, scant clear discharge with redness and tenderness noted, no lesions  Affirm taken Cervix: normal, non tender, no CMT Uterus: normal, non tender Adnexa:normal, non tender, no masses or fullness noted   A:Normal pelvic exam Atrophic vaginitis with Estrace cream use, working well Coconut oil use for dryness working well R/O vaginal infection Yeast dermatitis history, Mycolog working well for prn use needs refill  P:Discussed findings of normal pelvic exam and possible BV and etiology. Discussed Aveeno or baking soda sitz bath for comfort. Avoid moist clothes or pads for extended period of time. Lab Affirm will treat if indicated Continue Estrace cream and coconut oil with applicator use. Rx Mycolog see order. Cautioned about prolong use and thinning of skin . Patient voiced understanding.  Rv prn

## 2015-12-13 ENCOUNTER — Ambulatory Visit (INDEPENDENT_AMBULATORY_CARE_PROVIDER_SITE_OTHER): Payer: Medicare Other

## 2015-12-13 ENCOUNTER — Other Ambulatory Visit: Payer: Self-pay | Admitting: Certified Nurse Midwife

## 2015-12-13 DIAGNOSIS — B9689 Other specified bacterial agents as the cause of diseases classified elsewhere: Secondary | ICD-10-CM

## 2015-12-13 DIAGNOSIS — N760 Acute vaginitis: Principal | ICD-10-CM

## 2015-12-13 DIAGNOSIS — J309 Allergic rhinitis, unspecified: Secondary | ICD-10-CM | POA: Diagnosis not present

## 2015-12-13 LAB — WET PREP BY MOLECULAR PROBE
Candida species: NEGATIVE
Gardnerella vaginalis: POSITIVE — AB
Trichomonas vaginosis: NEGATIVE

## 2015-12-13 MED ORDER — METRONIDAZOLE 0.75 % VA GEL: VAGINAL | Status: DC

## 2015-12-15 NOTE — Progress Notes (Signed)
Reviewed personally.  M. Suzanne Arman Loy, MD.  

## 2015-12-21 DIAGNOSIS — H6123 Impacted cerumen, bilateral: Secondary | ICD-10-CM | POA: Diagnosis not present

## 2015-12-24 ENCOUNTER — Ambulatory Visit: Payer: Medicare Other | Admitting: Pediatrics

## 2015-12-26 ENCOUNTER — Ambulatory Visit (INDEPENDENT_AMBULATORY_CARE_PROVIDER_SITE_OTHER): Payer: Medicare Other

## 2015-12-26 DIAGNOSIS — T63441D Toxic effect of venom of bees, accidental (unintentional), subsequent encounter: Secondary | ICD-10-CM | POA: Diagnosis not present

## 2016-01-02 ENCOUNTER — Other Ambulatory Visit: Payer: Self-pay | Admitting: Allergy

## 2016-01-02 MED ORDER — AZELASTINE HCL 0.15 % NA SOLN: NASAL | Status: DC

## 2016-01-02 MED ORDER — AZELASTINE HCL 0.1 % NA SOLN
2.0000 | Freq: Two times a day (BID) | NASAL | Status: DC
Start: 2016-01-02 — End: 2016-07-15

## 2016-01-02 NOTE — Telephone Encounter (Signed)
CALLED [facility] IN [place] AND TOLD THEM IT WAS AZELASTINE 0.1%. THEY SAID PATIENT WAS MOVING TO [facility] IN [place].

## 2016-01-04 DIAGNOSIS — M1812 Unilateral primary osteoarthritis of first carpometacarpal joint, left hand: Secondary | ICD-10-CM | POA: Diagnosis not present

## 2016-01-07 ENCOUNTER — Ambulatory Visit (INDEPENDENT_AMBULATORY_CARE_PROVIDER_SITE_OTHER): Payer: Medicare Other | Admitting: Pediatrics

## 2016-01-07 ENCOUNTER — Encounter: Payer: Self-pay | Admitting: Pediatrics

## 2016-01-07 VITALS — BP 154/80 | HR 68 | Resp 12

## 2016-01-07 DIAGNOSIS — J3089 Other allergic rhinitis: Secondary | ICD-10-CM

## 2016-01-07 DIAGNOSIS — K219 Gastro-esophageal reflux disease without esophagitis: Secondary | ICD-10-CM | POA: Diagnosis not present

## 2016-01-07 DIAGNOSIS — T63481D Toxic effect of venom of other arthropod, accidental (unintentional), subsequent encounter: Secondary | ICD-10-CM | POA: Diagnosis not present

## 2016-01-07 DIAGNOSIS — T63481A Toxic effect of venom of other arthropod, accidental (unintentional), initial encounter: Secondary | ICD-10-CM | POA: Insufficient documentation

## 2016-01-07 NOTE — Progress Notes (Signed)
  [address] [place] [address] Dept: [phone]  FOLLOW UP NOTE  Patient ID: Teresa Morales, female    DOB: 1941-09-04  Age: 75 y.o. MRN: [id number] Date of Office Visit: 01/07/2016   Assessment Chief Complaint: Allergy Testing  HPI Teresa Morales presents for follow up allergy skin testing. She has been on allergy injections to molds for about 3 years and has not noted a great deal of relief from her symptoms unless  she gets her injections every 10 days. She had significant nasal congestion in the springtime and  she would like to be retested to see what the status of her allergies is. She is also on insect venom injections to mixed vespid once a month.  . Current medications are levocetirizine 5 mg once a day, Astelin 0.1% -2 sprays per nostril once or twice a day, Nasacort 1 spray per nostril twice a day, montelukast  10 mg at night. She also has Benadryl and EpiPen in case of an insect sting   Drug Allergies:  Allergies  Allergen Reactions  . Macrodantin [Nitrofurantoin] Hives and Shortness Of Breath  . Bee Venom Swelling  . Darvocet [Propoxyphene N-Acetaminophen] Hives and Itching  . Endocet [Oxycodone-Acetaminophen] Hives and Itching  . Floxin [Ofloxacin] Itching  . Latex Itching  . Penetrex [Enoxacin] Itching  . Sulfa Antibiotics Other (See Comments)    Childhood allergy     Physical Exam: BP 154/80 mmHg  Pulse 68  Resp 12  LMP 12/15/1993 (Approximate)   Physical Exam  Constitutional: She appears well-developed and well-nourished.  HENT:  Eyes normal. Ears normal. Nose normal. Pharynx normal.  Neck: Neck supple. No thyromegaly present.  Cardiovascular:  S1 and S2 normal no murmurs  Pulmonary/Chest:  Clear to percussion and auscultation  Abdominal: Soft. There is no tenderness.  Lymphadenopathy:    She has no cervical adenopathy.  Neurological: She is alert.  Psychiatric: She has a normal mood and affect. Her behavior is normal. Judgment and  thought content normal.  Vitals reviewed.   Diagnostics:  Allergy skin tests were positive to common indoor molds on intradermal testing only  Assessment and Plan: 1. Other allergic rhinitis   2. Gastroesophageal reflux disease without esophagitis   3. Insect sting allergy, current reaction, accidental or unintentional, subsequent encounter         Patient Instructions  Continue on your current medications Allergy injections once a week until June, then we will try them every 2 weeks I will add some more of the mold that you're currently allergic to  into your allergy vials.     Return in about 6 months (around 07/06/2016).    Thank you for the opportunity to care for this patient.  Please do not hesitate to contact me with questions.  Penne Lash, M.D.  [facility] [address] [place], [place] [address] [phone]

## 2016-01-07 NOTE — Patient Instructions (Signed)
Continue on your current medications Allergy injections once a week until June, then we will try them every 2 weeks I will add some more of the mold that you're currently allergic to  into your allergy vials.

## 2016-01-10 DIAGNOSIS — J3089 Other allergic rhinitis: Secondary | ICD-10-CM | POA: Diagnosis not present

## 2016-01-15 ENCOUNTER — Ambulatory Visit (INDEPENDENT_AMBULATORY_CARE_PROVIDER_SITE_OTHER): Payer: Medicare Other

## 2016-01-15 DIAGNOSIS — M47816 Spondylosis without myelopathy or radiculopathy, lumbar region: Secondary | ICD-10-CM | POA: Diagnosis not present

## 2016-01-15 DIAGNOSIS — J309 Allergic rhinitis, unspecified: Secondary | ICD-10-CM

## 2016-01-15 DIAGNOSIS — M545 Low back pain: Secondary | ICD-10-CM | POA: Diagnosis not present

## 2016-01-16 ENCOUNTER — Ambulatory Visit: Payer: Medicare Other | Attending: Orthopedic Surgery | Admitting: Physical Therapy

## 2016-01-16 ENCOUNTER — Encounter: Payer: Self-pay | Admitting: Physical Therapy

## 2016-01-16 DIAGNOSIS — M533 Sacrococcygeal disorders, not elsewhere classified: Secondary | ICD-10-CM | POA: Insufficient documentation

## 2016-01-16 DIAGNOSIS — M79645 Pain in left finger(s): Secondary | ICD-10-CM | POA: Diagnosis not present

## 2016-01-16 NOTE — Therapy (Signed)
[place] [address] [place], [place], [address] Phone: [phone]   Fax:  [phone]  Physical Therapy Evaluation  Patient Details  Name: Teresa Morales MRN: [id number] Date of Birth: 12/26/1940 Referring Provider: Apolonio Schneiders for the [name] for back  Encounter Date: 01/16/2016      PT End of Session - 01/16/16 1714    Visit Number 1   Date for PT Re-Evaluation 03/15/16   PT Start Time [id number]   PT Stop Time 1640   PT Time Calculation (min) 69 min   Activity Tolerance Patient tolerated treatment well   Behavior During Therapy [facility] for tasks assessed/performed;Anxious      Past Medical History  Diagnosis Date  . Osteopenia     intolerant of bisphosphonates  . SIADH (syndrome of inappropriate ADH production) ([place] 1998  . Endocervical polyp 1997  . Urethral stenosis     dilated  . Mild dysplasia of cervix     laser  . 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 Interstitial cystitis 2004  . GERD (gastroesophageal reflux disease)   . Melanoma of face ([place]) 04/2007  . Environmental allergies   . Hypertension   . MRSA infection     many yrs ago -right hand(tx. with oral antibiotic)-no further issues    Past Surgical History  Procedure Laterality Date  . Hernia repair  [id number], [id number]    umbilical mesh placed in August 2015.  . Wisdom tooth extraction    . Hernia repair  02-05-15    bilateral inguinal - mesh  . Varicose vein Left 2016    past years bilateral  . Hand surgery      cyst removed from rt hand  . Melanoma excision Right     '08- right cheek- no futher issues  . Colonoscopy with propofol N/A 10/02/2015    Procedure: COLONOSCOPY WITH PROPOFOL;  Surgeon: Garlan Fair, MD;  [facility]: [facility];  Service: Endoscopy;  Laterality: N/A;    There were no vitals filed for this visit.  Visit Diagnosis:  SI  (sacroiliac) pain - Plan: PT plan of care cert/re-cert  Thumb pain, left - Plan: PT plan of care cert/re-cert      Subjective Assessment - 01/16/16 1534    Subjective Patient has had low back and SI isues for a number of years, we have treated her in the past with good results when she does the exercises.  She is having more pain here recently and would like to remember the exercises.  She also reports that she has left CMC thumb pain in the past year, reports that she started noticing pain with ADL's of hosuework, she has had two injections.     Diagnostic tests arthritis in the [facility] joint   Patient Stated Goals have less pain   Currently in Pain? Yes   Pain Score 8    Pain Location Sacrum  also has pain in the left thumb at rest and immoblized pain a 4/10, up to 10/10 with ADL's   Pain Orientation Lower   Pain Descriptors / Indicators Aching   Pain Type Chronic pain   Pain Onset More than a month ago   Pain Frequency Constant   Aggravating Factors  ADLs increases the thumb pain, standing/walking/sitting/lying down will increase the SI pain   Pain Relieving Factors rest, immoblization   Effect of Pain on Daily  Activities limts ADL's due to pain            [facility] PT Assessment - 01/16/16 0001    Assessment   Medical Diagnosis SI pain, left lumb arthritis   Referring Provider Ortman for the thumb, [place] for back   Onset Date/Surgical Date 01/15/16   Hand Dominance Right   Prior Therapy in past for SI with full resolution of symptoms   Precautions   Precautions None   Balance Screen   Has the patient fallen in the past 6 months No   Has the patient had a decrease in activity level because of a fear of falling?  No   Is the patient reluctant to leave their home because of a fear of falling?  No   Home Environment   Additional Comments does housework, yardwork, and gardening   Prior Function   Level of Independence Independent   Vocation Retired   Leisure has not exercised  in the past 6 months   Posture/Postural Control   Posture Comments fwd head, rounded shoudlers   AROM   Overall AROM Comments AROM of the left thumb is WNL's for PIP and DIP, there is some decreased thumb extension and adduction with CMC joint and this causes pain. left wrist extension decreased to only 40 degrees. Lumbar flexion 0% limited, left side bend 25% limited, right side bend 5% limited, bilateral rotatio 0% limited.   Strength   Overall Strength Comments right grip 50#, left grip 40# , pinching and buttons all cause pain.   Flexibility   Soft Tissue Assessment /Muscle Length yes   Hamstrings no tightness   Piriformis no tightness   Palpation   Palpation comment she is very tender to palpation of the CMC, some swelling here, some crepitus as well.   Special Tests    Special Tests Sacrolliac Tests   Sacroiliac Tests  --  + compression&thrust, leg length =   Ambulation/Gait   Gait Comments slight forward trunk lean, maybe a little lean to the right                   [facility] Adult PT Treatment/Exercise - 01/16/16 0001    Modalities   Modalities Paraffin   LUE Paraffin   Number Minutes Paraffin 15 Minutes   LUE Paraffin Location Hand                PT Education - 01/16/16 1713    Education provided Yes   Education Details HEP for SI rotation, and Kegels exercises   Person(s) Educated Patient   Methods Explanation;Demonstration;Handout;Verbal cues;Tactile cues   Comprehension Verbalized understanding;Verbal cues required;Tactile cues required          PT Short Term Goals - 01/16/16 1738    PT SHORT TERM GOAL #1   Title understand her HEP   Time 1   Period Weeks   Status New           PT Long Term Goals - 01/16/16 1742    PT LONG TERM GOAL #1   Title Decrease pain 50%   Time 8   Period Weeks   Status New   PT LONG TERM GOAL #2   Title walk without pain   Time 8   Period Weeks   Status New   PT LONG TERM GOAL #3   Title understand  how important the exercises are for stability and how to correct an innominate rotation   Time 8   Period Weeks  Status New   PT LONG TERM GOAL #4   Title do ADL's without pain in the thumb               Plan - 2016-01-29 1715    Clinical Impression Statement Patient saw two MD's recently, she has an order for left CMC arthritis, and an order for SI dysfunction/lumbar pain.  She has been seen here before for the SI issue and it is resolved in a few visits but she has difficulty remembering the exercises.   She has pain with thumb adduction and extnesion   Pt will benefit from skilled therapeutic intervention in order to improve on the following deficits Abnormal gait;Decreased strength;Decreased range of motion;Difficulty walking;Impaired flexibility;Increased muscle spasms;Increased fascial restricitons;Improper body mechanics;Pain   Rehab Potential Good   PT Frequency 2x / week   PT Duration 8 weeks   PT Treatment/Interventions ADLs/Self Care Home Management;Cryotherapy;Electrical Stimulation;Moist Heat;Iontophoresis 4mg /ml Dexamethasone;Therapeutic exercise;Therapeutic activities;Ultrasound;Patient/family education;Manual techniques;Taping;Passive range of motion   PT Next Visit Plan --   Consulted and Agree with Plan of Care Patient          G-Codes - 01/29/2016 1746    Functional Assessment Tool Used foto 67% limitation   Functional Limitation Other PT primary   Other PT Primary Current Status [id number]) At least 60 percent but less than 80 percent impaired, limited or restricted   Other PT Primary Goal Status [id number]) At least 40 percent but less than 60 percent impaired, limited or restricted       Problem List Patient Active Problem List   Diagnosis Date Noted  . Gastroesophageal reflux disease without esophagitis 01/07/2016  . Insect sting allergy, current reaction 01/07/2016  . Rhinitis, allergic 08/25/2015  . Recurrent ventral hernia 05/31/2014    Sumner Boast., PT 2016-01-29, 5:48 PM  [place], [place], [address] Phone: [phone]   Fax:  [phone]  Name: Teresa Morales MRN: [id number] Date of Birth: 1941/12/03

## 2016-01-22 ENCOUNTER — Encounter: Payer: Self-pay | Admitting: Physical Therapy

## 2016-01-22 ENCOUNTER — Ambulatory Visit: Payer: Medicare Other | Admitting: Physical Therapy

## 2016-01-22 DIAGNOSIS — M533 Sacrococcygeal disorders, not elsewhere classified: Secondary | ICD-10-CM

## 2016-01-22 DIAGNOSIS — M79645 Pain in left finger(s): Secondary | ICD-10-CM | POA: Diagnosis not present

## 2016-01-22 NOTE — Therapy (Signed)
[place] [address] [place], [place], [address] Phone: [phone]   Fax:  [phone]  Physical Therapy Treatment  Patient Details  Name: Teresa Morales MRN: [id number] Date of Birth: May 18, 1941 Referring Provider: Apolonio Schneiders for the [name] for back  Encounter Date: 01/22/2016      PT End of Session - 01/22/16 1412    Visit Number 2   Date for PT Re-Evaluation 03/15/16   PT Start Time 1320   PT Stop Time 1420   PT Time Calculation (min) 60 min      Past Medical History  Diagnosis Date  . Osteopenia     intolerant of bisphosphonates  . SIADH (syndrome of inappropriate ADH production) ([place]) 1998  . Endocervical polyp 1997  . Urethral stenosis     dilated  . Mild dysplasia of cervix     laser  . 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 Interstitial cystitis 2004  . GERD (gastroesophageal reflux disease)   . Melanoma of face ([place]) 04/2007  . Environmental allergies   . Hypertension   . MRSA infection     many yrs ago -right hand(tx. with oral antibiotic)-no further issues    Past Surgical History  Procedure Laterality Date  . Hernia repair  [id number], [id number]    umbilical mesh placed in August 2015.  . Wisdom tooth extraction    . Hernia repair  02-05-15    bilateral inguinal - mesh  . Varicose vein Left 2016    past years bilateral  . Hand surgery      cyst removed from rt hand  . Melanoma excision Right     '08- right cheek- no futher issues  . Colonoscopy with propofol N/A 10/02/2015    Procedure: COLONOSCOPY WITH PROPOFOL;  Surgeon: Garlan Fair, MD;  [facility];  Service: Endoscopy;  Laterality: N/A;    There were no vitals filed for this visit.  Visit Diagnosis:  SI (sacroiliac) pain  Thumb pain, left      Subjective Assessment - 01/22/16 1408    Subjective 2 bracs fo thumb I need  you to look at   Currently in Pain? Yes   Pain Score 7    Pain Location Sacrum   Pain Orientation Right                         [facility] Adult PT Treatment/Exercise - 01/22/16 0001    Modalities   Modalities Electrical Stimulation;Iontophoresis;Ultrasound;Traction   Ultrasound   Ultrasound Location RT SI and Left thumb   Iontophoresis   Type of Iontophoresis Dexamethasone   Location RT SI   Dose 1,2cc   Time 4 hour   Traction   Type of Traction Lumbar   Min (lbs) 40   Time 15   Manual Therapy   Manual Therapy Joint mobilization;Soft tissue mobilization   Manual therapy comments RT SI and lumbar, left thumb                PT Education - 01/22/16 1411    Education provided Yes   Education Details use of soft brace for Left thumb daily to decrease strain and irritation   Person(s) Educated Patient   Methods Explanation;Demonstration   Comprehension Verbalized understanding;Returned demonstration          PT Short Term Goals - 01/16/16 1738  PT SHORT TERM GOAL #1   Title understand her HEP   Time 1   Period Weeks   Status New           PT Long Term Goals - 01/16/16 1742    PT LONG TERM GOAL #1   Title Decrease pain 50%   Time 8   Period Weeks   Status New   PT LONG TERM GOAL #2   Title walk without pain   Time 8   Period Weeks   Status New   PT LONG TERM GOAL #3   Title understand how important the exercises are for stability and how to correct an innominate rotation   Time 8   Period Weeks   Status New   PT LONG TERM GOAL #4   Title do ADL's without pain in the thumb               Plan - 01/22/16 1412    Clinical Impression Statement pain in RT SI and LB but alignment equal today,educated on thumb brace and focus to decrease inflammation   PT Next Visit Plan assess and progrss        Problem List Patient Active Problem List   Diagnosis Date Noted  . Gastroesophageal reflux disease without esophagitis  01/07/2016  . Insect sting allergy, current reaction 01/07/2016  . Rhinitis, allergic 08/25/2015  . Recurrent ventral hernia 05/31/2014    Irwin Toran,ANGIE PTA    01/22/2016, 2:15 PM  [place], [place], [address] Phone: [phone]   Fax:  [phone]  Name: Teresa Morales MRN: [id number] Date of Birth: [birth year]

## 2016-01-23 ENCOUNTER — Ambulatory Visit (INDEPENDENT_AMBULATORY_CARE_PROVIDER_SITE_OTHER): Payer: Medicare Other

## 2016-01-23 DIAGNOSIS — J309 Allergic rhinitis, unspecified: Secondary | ICD-10-CM

## 2016-01-23 DIAGNOSIS — H02054 Trichiasis without entropian left upper eyelid: Secondary | ICD-10-CM | POA: Diagnosis not present

## 2016-01-24 ENCOUNTER — Encounter: Payer: Self-pay | Admitting: Physical Therapy

## 2016-01-24 ENCOUNTER — Ambulatory Visit: Payer: Medicare Other | Admitting: Physical Therapy

## 2016-01-24 DIAGNOSIS — M533 Sacrococcygeal disorders, not elsewhere classified: Secondary | ICD-10-CM | POA: Diagnosis not present

## 2016-01-24 DIAGNOSIS — M79645 Pain in left finger(s): Secondary | ICD-10-CM | POA: Diagnosis not present

## 2016-01-24 NOTE — Therapy (Signed)
[place] [address] [address] [place], [place], [address] Phone: [phone]   Fax:  [phone]  Physical Therapy Treatment  Patient Details  Name: Teresa Morales MRN: [id number] Date of Birth: May 21, 1941 Referring Provider: Apolonio Schneiders for the [name] for back  Encounter Date: 01/24/2016      PT End of Session - 01/24/16 1128    Visit Number 3   Date for PT Re-Evaluation 03/15/16   PT Start Time 1040   PT Stop Time 1130   PT Time Calculation (min) 50 min      Past Medical History  Diagnosis Date  . Osteopenia     intolerant of bisphosphonates  . SIADH (syndrome of inappropriate ADH production) ([place]) 1998  . Endocervical polyp 1997  . Urethral stenosis     dilated  . Mild dysplasia of cervix     laser  . 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 Interstitial cystitis 2004  . GERD (gastroesophageal reflux disease)   . Melanoma of face ([place]) 04/2007  . Environmental allergies   . Hypertension   . MRSA infection     many yrs ago -right hand(tx. with oral antibiotic)-no further issues    Past Surgical History  Procedure Laterality Date  . Hernia repair  [id number], [id number]    umbilical mesh placed in August 2015.  . Wisdom tooth extraction    . Hernia repair  02-05-15    bilateral inguinal - mesh  . Varicose vein Left 2016    past years bilateral  . Hand surgery      cyst removed from rt hand  . Melanoma excision Right     '08- right cheek- no futher issues  . Colonoscopy with propofol N/A 10/02/2015    Procedure: COLONOSCOPY WITH PROPOFOL;  Surgeon: Garlan Fair, MD;  [facility];  Service: Endoscopy;  Laterality: N/A;    There were no vitals filed for this visit.  Visit Diagnosis:  SI (sacroiliac) pain      Subjective Assessment - 01/24/16 1049    Subjective back felt better after last session , just  some soreness in RT buttock. trying to wear brace more for thumb   Currently in Pain? Yes   Pain Score 5    Pain Location Buttocks   Pain Orientation Right                         [facility] Adult PT Treatment/Exercise - 01/24/16 0001    Exercises   Exercises Lumbar   Lumbar Exercises: Supine   Clam 15 reps  red tband   Bridge 15 reps  withh hip add ball squeeze   Straight Leg Raise 10 reps  with abd   Other Supine Lumbar Exercises bridge and obl with large ball 15 times each   Other Supine Lumbar Exercises marching red tband 15 times   Ultrasound   Ultrasound Location Left thumb   Traction   Type of Traction Lumbar   Min (lbs) 40   Manual Therapy   Manual Therapy Soft tissue mobilization   Manual therapy comments tightness on SI and LBP but alignment good   Soft tissue mobilization LBP/SI and thumb                PT Education - 01/24/16 1128    Education provided Yes   Education Details stressed  use of brace          PT Short Term Goals - 01/16/16 1738    PT SHORT TERM GOAL #1   Title understand her HEP   Time 1   Period Weeks   Status New           PT Long Term Goals - 01/16/16 1742    PT LONG TERM GOAL #1   Title Decrease pain 50%   Time 8   Period Weeks   Status New   PT LONG TERM GOAL #2   Title walk without pain   Time 8   Period Weeks   Status New   PT LONG TERM GOAL #3   Title understand how important the exercises are for stability and how to correct an innominate rotation   Time 8   Period Weeks   Status New   PT LONG TERM GOAL #4   Title do ADL's without pain in the thumb               Plan - 01/24/16 1129    Clinical Impression Statement tightness in bilateral SI, Left greater RT. Left thumb mobs,crepitus. Lots of education on joint protection with ADL   PT Next Visit Plan assess and progrss        Problem List Patient Active Problem List   Diagnosis Date Noted  . Gastroesophageal reflux disease  without esophagitis 01/07/2016  . Insect sting allergy, current reaction 01/07/2016  . Rhinitis, allergic 08/25/2015  . Recurrent ventral hernia 05/31/2014    Sairah Knobloch,ANGIE PTA 01/24/2016, 11:32 AM  [place], [place], [address] Phone: [phone]   Fax:  [phone]  Name: Teresa Morales MRN: [id number] Date of Birth: 08-Oct-1941

## 2016-01-25 ENCOUNTER — Encounter: Payer: Medicare Other | Admitting: Physical Therapy

## 2016-01-29 ENCOUNTER — Ambulatory Visit: Payer: Medicare Other | Admitting: Physical Therapy

## 2016-01-29 ENCOUNTER — Encounter: Payer: Self-pay | Admitting: Physical Therapy

## 2016-01-29 DIAGNOSIS — M79645 Pain in left finger(s): Secondary | ICD-10-CM

## 2016-01-29 DIAGNOSIS — M533 Sacrococcygeal disorders, not elsewhere classified: Secondary | ICD-10-CM | POA: Diagnosis not present

## 2016-01-29 NOTE — Therapy (Signed)
[place] [address] [place], [place], [address] Phone: [phone]   Fax:  [phone]  Physical Therapy Treatment  Patient Details  Name: Teresa Morales MRN: [id number] Date of Birth: Dec 04, 1941 Referring Provider: Apolonio Morales for the Teresa Morales for back  Encounter Date: 01/29/2016      PT End of Session - 01/29/16 1352    Visit Number 4   Date for PT Re-Evaluation 03/15/16   PT Start Time 1320   PT Stop Time 1410   PT Time Calculation (min) 50 min      Past Medical History  Diagnosis Date  . Osteopenia     intolerant of bisphosphonates  . SIADH (syndrome of inappropriate ADH production) ([place]) 1998  . Endocervical polyp 1997  . Urethral stenosis     dilated  . Mild dysplasia of cervix     laser  . Atypical glandular cells of undetermined significance (AGUS) on cervical Pap smear 12/1998    endo polyp- Teresa Morales  . Interstitial cystitis 2004  . GERD (gastroesophageal reflux Morales)   . Melanoma of face (Teresa Morales) 04/2007  . Environmental allergies   . Hypertension   . MRSA infection     many yrs ago -right hand(tx. with oral antibiotic)-no further issues    Past Surgical History  Procedure Laterality Date  . Hernia repair  [id number], [id number]    umbilical mesh placed in August 2015.  . Wisdom tooth extraction    . Hernia repair  02-05-15    bilateral inguinal - mesh  . Varicose vein Left 2016    past years bilateral  . Hand surgery      cyst removed from rt hand  . Melanoma excision Right     '08- right cheek- no futher issues  . Colonoscopy with propofol N/A 10/02/2015    Procedure: COLONOSCOPY WITH PROPOFOL;  Surgeon: Garlan Fair, MD;  [facility];  Service: Endoscopy;  Laterality: N/A;    There were no vitals filed for this visit.  Visit Diagnosis:  SI (sacroiliac) pain  Thumb pain, left      Subjective Assessment - 01/29/16 1326    Subjective feeling better  overall,both back and thumb, Soreness no pain today   Currently in Pain? Yes   Pain Score 3                          [facility] Adult PT Treatment/Exercise - 01/29/16 0001    Lumbar Exercises: Aerobic   Stationary Bike Nustep l4 5 min   Lumbar Exercises: Supine   Bridge 15 reps  with ball squeeze   Straight Leg Raise 10 reps  with abd   Other Supine Lumbar Exercises bridge and obl with large ball 15 times each   Other Supine Lumbar Exercises marching and abd  red tband 15 times   Ultrasound   Ultrasound Location left thumb   Traction   Type of Traction Lumbar   Min (lbs) 40   Manual Therapy   Manual therapy comments SI equal                  PT Short Term Goals - 01/16/16 1738    PT SHORT TERM GOAL #1   Title understand her HEP   Time 1   Period Weeks   Status New           PT Long Term Goals - 01/16/16 1742  PT LONG TERM GOAL #1   Title Decrease pain 50%   Time 8   Period Weeks   Status New   PT LONG TERM GOAL #2   Title walk without pain   Time 8   Period Weeks   Status New   PT LONG TERM GOAL #3   Title understand how important the exercises are for stability and how to correct an innominate rotation   Time 8   Period Weeks   Status New   PT LONG TERM GOAL #4   Title do ADL's without pain in the thumb               Plan - 01/29/16 1352    Clinical Impression Statement equal SI, no pain just sorenss so increased ther ex with no increased pain. progressing   PT Next Visit Plan progress ex lumb and thumb        Problem List Patient Active Problem List   Diagnosis Date Noted  . Gastroesophageal reflux Morales without esophagitis 01/07/2016  . Insect sting allergy, current reaction 01/07/2016  . Rhinitis, allergic 08/25/2015  . Recurrent ventral hernia 05/31/2014    Teresa Morales,Teresa Morales PTA 01/29/2016, 1:54 PM  [place] [address] [place], [place],  [address] Phone: [phone]   Fax:  [phone]  Name: Teresa Morales MRN: [id number] Date of Birth: 11-29-1941

## 2016-01-31 ENCOUNTER — Ambulatory Visit (INDEPENDENT_AMBULATORY_CARE_PROVIDER_SITE_OTHER): Payer: Medicare Other

## 2016-01-31 ENCOUNTER — Encounter: Payer: Self-pay | Admitting: Physical Therapy

## 2016-01-31 ENCOUNTER — Ambulatory Visit: Payer: Medicare Other | Admitting: Physical Therapy

## 2016-01-31 DIAGNOSIS — T63441D Toxic effect of venom of bees, accidental (unintentional), subsequent encounter: Secondary | ICD-10-CM

## 2016-01-31 DIAGNOSIS — M533 Sacrococcygeal disorders, not elsewhere classified: Secondary | ICD-10-CM

## 2016-01-31 DIAGNOSIS — M79645 Pain in left finger(s): Secondary | ICD-10-CM

## 2016-01-31 NOTE — Therapy (Signed)
[address] [place], [place], [address] Phone: [phone]   Fax:  [phone]  Physical Therapy Treatment  Patient Details  Name: Teresa Morales MRN: [id number] Date of Birth: 1941/09/20 Referring Provider: Apolonio Schneiders for the [name] for back  Encounter Date: 01/31/2016      PT End of Session - 01/31/16 1155    Visit Number 5   Date for PT Re-Evaluation 03/15/16   PT Start Time 1107   PT Stop Time 1210   PT Time Calculation (min) 63 min      Past Medical History  Diagnosis Date  . Osteopenia     intolerant of bisphosphonates  . SIADH (syndrome of inappropriate ADH production) ([place]) 1998  . Endocervical polyp 1997  . Urethral stenosis     dilated  . Mild dysplasia of cervix     laser  . 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 Interstitial cystitis 2004  . GERD (gastroesophageal reflux disease)   . Melanoma of face ([place]) 04/2007  . Environmental allergies   . Hypertension   . MRSA infection     many yrs ago -right hand(tx. with oral antibiotic)-no further issues    Past Surgical History  Procedure Laterality Date  . Hernia repair  7322, 0/2/54    umbilical mesh placed in August 2015.  . Wisdom tooth extraction    . Hernia repair  02-05-15    bilateral inguinal - mesh  . Varicose vein Left 2016    past years bilateral  . Hand surgery      cyst removed from rt hand  . Melanoma excision Right     '08- right cheek- no futher issues  . Colonoscopy with propofol N/A 10/02/2015    Procedure: COLONOSCOPY WITH PROPOFOL;  Surgeon: Garlan Fair, MD;  [facility]: [facility];  Service: Endoscopy;  Laterality: N/A;    There were no vitals filed for this visit.  Visit Diagnosis:  Thumb pain, left  SI (sacroiliac) pain      Subjective Assessment - 01/31/16 1113    Subjective alittle sore washing  dishes but did some stretches and it helped, thumb better with brace   Currently in Pain? Yes   Pain Score 3    Pain Location Back   Pain Orientation Mid;Lower                         [facility] Adult PT Treatment/Exercise - 01/31/16 0001    Exercises   Exercises --   Lumbar Exercises: Aerobic   Stationary Bike Nustep L4 5 min   UBE (Upper Arm Bike) 1.5 fwd/1.5 back L 3   Lumbar Exercises: Machines for Strengthening   Other Lumbar Machine Exercise lat pull and seated row 15 # 2 sets 10   Lumbar Exercises: Standing   Other Standing Lumbar Exercises red tband hip 3 way 10 times   Hand Exercises   Other Hand Exercises finger web and velcro board   Iontophoresis   Type of Iontophoresis Dexamethasone   Location left thumb   Dose 1.2cc   Time 4 hour   Traction   Type of Traction Lumbar   Min (lbs) 40   Manual Therapy   Manual therapy comments SI equal                  PT Short Term  Goals - 01/31/16 1158    PT SHORT TERM GOAL #1   Title understand her HEP   Status Achieved           PT Long Term Goals - 01/31/16 1158    PT LONG TERM GOAL #1   Title Decrease pain 50%   Status On-going   PT LONG TERM GOAL #2   Title walk without pain   Status On-going   PT LONG TERM GOAL #3   Title understand how important the exercises are for stability and how to correct an innominate rotation   Status Partially Met   PT LONG TERM GOAL #4   Title do ADL's without pain in the thumb   Status On-going               Plan - 01/31/16 1159    Clinical Impression Statement equal SI an dtolerated increased ther ex today. increased thumb pain with ther ex. Constant eductaion on overuse and maintaining.   PT Next Visit Plan progress ex lumb and thumb        Problem List Patient Active Problem List   Diagnosis Date Noted  . Gastroesophageal reflux disease without esophagitis 01/07/2016  . Insect sting allergy, current reaction 01/07/2016  . Rhinitis,  allergic 08/25/2015  . Recurrent ventral hernia 05/31/2014    Alysson Geist,ANGIE PTA 01/31/2016, 12:00 PM  [address] [place], [place], [address] Phone: [phone]   Fax:  [phone]  Name: Teresa Morales MRN: [id number] Date of Birth: November 24, 1941

## 2016-02-01 DIAGNOSIS — L814 Other melanin hyperpigmentation: Secondary | ICD-10-CM | POA: Diagnosis not present

## 2016-02-01 DIAGNOSIS — I10 Essential (primary) hypertension: Secondary | ICD-10-CM | POA: Diagnosis not present

## 2016-02-01 DIAGNOSIS — D1801 Hemangioma of skin and subcutaneous tissue: Secondary | ICD-10-CM | POA: Diagnosis not present

## 2016-02-01 DIAGNOSIS — B351 Tinea unguium: Secondary | ICD-10-CM | POA: Diagnosis not present

## 2016-02-01 DIAGNOSIS — Z85820 Personal history of malignant melanoma of skin: Secondary | ICD-10-CM | POA: Diagnosis not present

## 2016-02-01 DIAGNOSIS — L821 Other seborrheic keratosis: Secondary | ICD-10-CM | POA: Diagnosis not present

## 2016-02-01 DIAGNOSIS — Z85828 Personal history of other malignant neoplasm of skin: Secondary | ICD-10-CM | POA: Diagnosis not present

## 2016-02-01 DIAGNOSIS — L570 Actinic keratosis: Secondary | ICD-10-CM | POA: Diagnosis not present

## 2016-02-01 DIAGNOSIS — D485 Neoplasm of uncertain behavior of skin: Secondary | ICD-10-CM | POA: Diagnosis not present

## 2016-02-04 ENCOUNTER — Other Ambulatory Visit: Payer: Self-pay | Admitting: Allergy and Immunology

## 2016-02-05 ENCOUNTER — Ambulatory Visit: Payer: Medicare Other | Admitting: Physical Therapy

## 2016-02-05 ENCOUNTER — Encounter: Payer: Self-pay | Admitting: Physical Therapy

## 2016-02-05 DIAGNOSIS — M533 Sacrococcygeal disorders, not elsewhere classified: Secondary | ICD-10-CM | POA: Diagnosis not present

## 2016-02-05 DIAGNOSIS — M79645 Pain in left finger(s): Secondary | ICD-10-CM

## 2016-02-05 NOTE — Therapy (Signed)
[address] [place], [place], [address] Phone: [phone]   Fax:  [phone]  Physical Therapy Treatment  Patient Details  Name: Teresa Morales MRN: [id number] Date of Birth: 1941/03/30 Referring Provider: Apolonio Schneiders for the [name] for back  Encounter Date: 02/05/2016      PT End of Session - 02/05/16 1347    Visit Number 6   Date for PT Re-Evaluation 03/15/16   PT Start Time 9798   PT Stop Time 1400   PT Time Calculation (min) 53 min      Past Medical History  Diagnosis Date  . Osteopenia     intolerant of bisphosphonates  . SIADH (syndrome of inappropriate ADH production) ([place]) 1998  . Endocervical polyp 1997  . Urethral stenosis     dilated  . Mild dysplasia of cervix     laser  . 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 Interstitial cystitis 2004  . GERD (gastroesophageal reflux disease)   . Melanoma of face ([place]) 04/2007  . Environmental allergies   . Hypertension   . MRSA infection     many yrs ago -right hand(tx. with oral antibiotic)-no further issues    Past Surgical History  Procedure Laterality Date  . Hernia repair  9211, 08/18/16    umbilical mesh placed in August 2015.  . Wisdom tooth extraction    . Hernia repair  02-05-15    bilateral inguinal - mesh  . Varicose vein Left 2016    past years bilateral  . Hand surgery      cyst removed from rt hand  . Melanoma excision Right     '08- right cheek- no futher issues  . Colonoscopy with propofol N/A 10/02/2015    Procedure: COLONOSCOPY WITH PROPOFOL;  Surgeon: Garlan Fair, MD;  [facility];  Service: Endoscopy;  Laterality: N/A;    There were no vitals filed for this visit.  Visit Diagnosis:  Thumb pain, left  SI (sacroiliac) pain      Subjective Assessment - 02/05/16 1313    Subjective walked a 1.3 miles,sore  but no pain. thumb fair-support does help   Currently in Pain? Yes   Pain Score 3    Pain Location Back   Pain Descriptors / Indicators Sore                         [facility] Adult PT Treatment/Exercise - 02/05/16 0001    Lumbar Exercises: Aerobic   Stationary Bike L 5 6 min   Lumbar Exercises: Standing   Other Standing Lumbar Exercises red tband hip 3 way 15 times   Lumbar Exercises: Supine   Bridge 15 reps  yellow wt ball   Other Supine Lumbar Exercises bridge and obl with large ball 15 times each   Other Supine Lumbar Exercises marching and abd  red tband 15 times   Hand Exercises   Other Hand Exercises green wt ball for grip   Iontophoresis   Type of Iontophoresis Dexamethasone   Location left thumb   Dose 1.2cc   Time 4 hour   Traction   Type of Traction Lumbar  with heat   Min (lbs) 45   Manual Therapy   Manual Therapy Joint mobilization  left thumb  PT Short Term Goals - 01/31/16 1158    PT SHORT TERM GOAL #1   Title understand her HEP   Status Achieved           PT Long Term Goals - 01/31/16 1158    PT LONG TERM GOAL #1   Title Decrease pain 50%   Status On-going   PT LONG TERM GOAL #2   Title walk without pain   Status On-going   PT LONG TERM GOAL #3   Title understand how important the exercises are for stability and how to correct an innominate rotation   Status Partially Met   PT LONG TERM GOAL #4   Title do ADL's without pain in the thumb   Status On-going               Plan - 02/05/16 1347    Clinical Impression Statement pt with decreasing pain and increasing tolerance to ther ex, SI is staying stab.    PT Next Visit Plan progress ex lumb and thumb        Problem List Patient Active Problem List   Diagnosis Date Noted  . Gastroesophageal reflux disease without esophagitis 01/07/2016  . Insect sting allergy, current reaction 01/07/2016  . Rhinitis, allergic 08/25/2015  . Recurrent  ventral hernia 05/31/2014    Iziah Cates,ANGIE PTA 02/05/2016, 1:49 PM  [address] [place], [place], [address] Phone: [phone]   Fax:  [phone]  Name: Teresa Morales MRN: [id number] Date of Birth: 07-Oct-1941

## 2016-02-06 ENCOUNTER — Ambulatory Visit (INDEPENDENT_AMBULATORY_CARE_PROVIDER_SITE_OTHER): Payer: Medicare Other

## 2016-02-06 DIAGNOSIS — J309 Allergic rhinitis, unspecified: Secondary | ICD-10-CM

## 2016-02-07 ENCOUNTER — Ambulatory Visit: Payer: Medicare Other | Admitting: Physical Therapy

## 2016-02-07 DIAGNOSIS — M79645 Pain in left finger(s): Secondary | ICD-10-CM

## 2016-02-07 DIAGNOSIS — M533 Sacrococcygeal disorders, not elsewhere classified: Secondary | ICD-10-CM | POA: Diagnosis not present

## 2016-02-07 NOTE — Therapy (Signed)
[address] [place], [place], [address] Phone: [phone]   Fax:  [phone]  Physical Therapy Treatment  Patient Details  Name: Teresa Morales MRN: [id number] Date of Birth: [birth year] Referring Provider: Apolonio Schneiders for the [name] for back  Encounter Date: 02/07/2016      PT End of Session - 02/07/16 1142    Visit Number 7   Date for PT Re-Evaluation 03/15/16   PT Start Time 1110   PT Stop Time 1200   PT Time Calculation (min) 50 min      Past Medical History  Diagnosis Date  . Osteopenia     intolerant of bisphosphonates  . SIADH (syndrome of inappropriate ADH production) ([place]) 1998  . Endocervical polyp 1997  . Urethral stenosis     dilated  . Mild dysplasia of cervix     laser  . 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 Interstitial cystitis 2004  . GERD (gastroesophageal reflux disease)   . Melanoma of face ([place]) 04/2007  . Environmental allergies   . Hypertension   . MRSA infection     many yrs ago -right hand(tx. with oral antibiotic)-no further issues    Past Surgical History  Procedure Laterality Date  . Hernia repair  2111, 04/19/19    umbilical mesh placed in August 2015.  . Wisdom tooth extraction    . Hernia repair  02-05-15    bilateral inguinal - mesh  . Varicose vein Left 2016    past years bilateral  . Hand surgery      cyst removed from rt hand  . Melanoma excision Right     '08- right cheek- no futher issues  . Colonoscopy with propofol N/A 10/02/2015    Procedure: COLONOSCOPY WITH PROPOFOL;  Surgeon: Garlan Fair, MD;  [facility];  Service: Endoscopy;  Laterality: N/A;    There were no vitals filed for this visit.  Visit Diagnosis:  Thumb pain, left  SI (sacroiliac) pain      Subjective Assessment - 02/07/16 1140    Subjective walked a mle ,did well.  wearing neospryn brace for thumb and it helps   Currently in Pain? No/denies            [facility] PT Assessment - 02/07/16 0001    AROM   Overall AROM Comments lumbar ROM WFLS, grip strength 25#                     [facility] Adult PT Treatment/Exercise - 02/07/16 0001    Iontophoresis   Type of Iontophoresis Dexamethasone   Location left thumb   Dose 1.2cc   Time 4 hour   Traction   Type of Traction Lumbar  with heat   Min (lbs) 45                PT Education - 02/07/16 1141    Education provided Yes   Education Details HEP tband scap and hip strength, core stab,kegels,stretches   Person(s) Educated Patient   Methods Explanation;Demonstration;Tactile cues;Verbal cues;Handout   Comprehension Verbalized understanding;Returned demonstration          PT Short Term Goals - 02/07/16 1135    PT SHORT TERM GOAL #1   Title understand her HEP   Status Achieved           PT Long Term Goals -  02/07/16 1139    PT LONG TERM GOAL #1   Title Decrease pain 50%   Baseline met for back, not thumb   Status Partially Met   PT LONG TERM GOAL #2   Title walk without pain   Status Partially Met   PT LONG TERM GOAL #3   Title understand how important the exercises are for stability and how to correct an innominate rotation   Status Achieved   PT LONG TERM GOAL #4   Title do ADL's without pain in the thumb   Status Partially Met               Plan - 02/07/16 1142    Clinical Impression Statement pt overall improving,LB ROM is WFLS and decreased pain with no rotation for greater than 1 week. Tumb swelling down and pain better esp if compliant with wearing brace. Progressing with goals.   PT Next Visit Plan check HEP, write note for thumb for MD        Problem List Patient Active Problem List   Diagnosis Date Noted  . Gastroesophageal reflux disease without esophagitis 01/07/2016  . Insect sting allergy, current reaction 01/07/2016  . Rhinitis,  allergic 08/25/2015  . Recurrent ventral hernia 05/31/2014    PAYSEUR,ANGIE PTA 02/07/2016, 11:44 AM  [place], [place], [address] Phone: [phone]   Fax:  [phone]  Name: Teresa Morales MRN: [id number] Date of Birth: 1941-04-01

## 2016-02-12 ENCOUNTER — Ambulatory Visit: Payer: Medicare Other | Admitting: Physical Therapy

## 2016-02-12 ENCOUNTER — Encounter: Payer: Self-pay | Admitting: Physical Therapy

## 2016-02-12 DIAGNOSIS — M79645 Pain in left finger(s): Secondary | ICD-10-CM

## 2016-02-12 DIAGNOSIS — M533 Sacrococcygeal disorders, not elsewhere classified: Secondary | ICD-10-CM

## 2016-02-12 NOTE — Therapy (Signed)
[address] [place], [place], [address] Phone: [phone]   Fax:  [phone]  Physical Therapy Treatment  Patient Details  Name: Teresa Morales MRN: [id number] Date of Birth: [birth year] Referring Provider: Apolonio Schneiders for the [name] for back  Encounter Date: 02/12/2016      PT End of Session - 02/12/16 1350    Visit Number 8   Date for PT Re-Evaluation 03/15/16   PT Start Time 1310   PT Stop Time 1400   PT Time Calculation (min) 50 min      Past Medical History  Diagnosis Date  . Osteopenia     intolerant of bisphosphonates  . SIADH (syndrome of inappropriate ADH production) ([place]) 1998  . Endocervical polyp 1997  . Urethral stenosis     dilated  . Mild dysplasia of cervix     laser  . 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 Interstitial cystitis 2004  . GERD (gastroesophageal reflux disease)   . Melanoma of face ([place]) 04/2007  . Environmental allergies   . Hypertension   . MRSA infection     many yrs ago -right hand(tx. with oral antibiotic)-no further issues    Past Surgical History  Procedure Laterality Date  . Hernia repair  9798, 08/16/10    umbilical mesh placed in August 2015.  . Wisdom tooth extraction    . Hernia repair  02-05-15    bilateral inguinal - mesh  . Varicose vein Left 2016    past years bilateral  . Hand surgery      cyst removed from rt hand  . Melanoma excision Right     '08- right cheek- no futher issues  . Colonoscopy with propofol N/A 10/02/2015    Procedure: COLONOSCOPY WITH PROPOFOL;  Surgeon: Garlan Fair, MD;  [facility];  Service: Endoscopy;  Laterality: N/A;    There were no vitals filed for this visit.  Visit Diagnosis:  Thumb pain, left  SI (sacroiliac) pain      Subjective Assessment - 02/12/16 1325    Subjective back 80% better, thumb  50%. Doing HEPs   Currently in Pain? Yes   Pain Score 4    Pain Location Hand   Pain Orientation Left                         [facility] Adult PT Treatment/Exercise - 02/12/16 0001    Lumbar Exercises: Aerobic   Stationary Bike L 5 6 min   Lumbar Exercises: Machines for Strengthening   Cybex Knee Extension 10# 2 sets 10   Cybex Knee Flexion 15 # 2 sets 10   Leg Press 20# 2 sets 10   Other Lumbar Machine Exercise lat pull and seated row 15 # 2 sets 10   Iontophoresis   Type of Iontophoresis Dexamethasone   Location left thumb   Dose 1.2cc   Time 4 hour                  PT Short Term Goals - 02/07/16 1135    PT SHORT TERM GOAL #1   Title understand her HEP   Status Achieved           PT Long Term Goals - 02/12/16 1333    PT LONG TERM GOAL #1   Title Decrease pain 50%   Status  Achieved   PT LONG TERM GOAL #2   Title walk without pain   Status Partially Met   PT LONG TERM GOAL #3   Title understand how important the exercises are for stability and how to correct an innominate rotation   Status Achieved   PT LONG TERM GOAL #4   Title do ADL's without pain in the thumb   Baseline better understanding of thumb support with activity   Status Partially Met               Plan - 02/12/16 1350    Clinical Impression Statement increased ther ex today without modalities. good understanding of thumb maintainance   PT Next Visit Plan write MD note and HOLD if doing well        Problem List Patient Active Problem List   Diagnosis Date Noted  . Gastroesophageal reflux disease without esophagitis 01/07/2016  . Insect sting allergy, current reaction 01/07/2016  . Rhinitis, allergic 08/25/2015  . Recurrent ventral hernia 05/31/2014    Reshad Saab,ANGIE PTA 02/12/2016, 1:56 PM  [place], [place], [address] Phone: [phone]   Fax:  [phone]  Name:  Teresa Morales MRN: [id number] Date of Birth: 1941-11-25

## 2016-02-13 ENCOUNTER — Ambulatory Visit (INDEPENDENT_AMBULATORY_CARE_PROVIDER_SITE_OTHER): Payer: Medicare Other

## 2016-02-13 DIAGNOSIS — J309 Allergic rhinitis, unspecified: Secondary | ICD-10-CM | POA: Diagnosis not present

## 2016-02-14 ENCOUNTER — Ambulatory Visit: Payer: Medicare Other | Attending: Orthopedic Surgery | Admitting: Physical Therapy

## 2016-02-14 ENCOUNTER — Other Ambulatory Visit: Payer: Self-pay

## 2016-02-14 ENCOUNTER — Encounter: Payer: Self-pay | Admitting: Physical Therapy

## 2016-02-14 DIAGNOSIS — M79645 Pain in left finger(s): Secondary | ICD-10-CM | POA: Diagnosis not present

## 2016-02-14 DIAGNOSIS — M533 Sacrococcygeal disorders, not elsewhere classified: Secondary | ICD-10-CM

## 2016-02-14 MED ORDER — MONTELUKAST SODIUM 10 MG PO TABS: ORAL_TABLET | ORAL | Status: DC

## 2016-02-14 NOTE — Therapy (Signed)
[address] [place], [place], [address] Phone: [phone]   Fax:  [phone]  Physical Therapy Treatment  Patient Details  Name: Teresa Morales MRN: [id number] Date of Birth: [birth year] Referring Provider: Apolonio Schneiders for the [name] for back  Encounter Date: 02/14/2016      PT End of Session - 02/14/16 1032    Visit Number 9   Date for PT Re-Evaluation 03/15/16   PT Start Time 1020   PT Stop Time 1100   PT Time Calculation (min) 40 min      Past Medical History  Diagnosis Date  . Osteopenia     intolerant of bisphosphonates  . SIADH (syndrome of inappropriate ADH production) ([place]) 1998  . Endocervical polyp 1997  . Urethral stenosis     dilated  . Mild dysplasia of cervix     laser  . 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 Interstitial cystitis 2004  . GERD (gastroesophageal reflux disease)   . Melanoma of face ([place]) 04/2007  . Environmental allergies   . Hypertension   . MRSA infection     many yrs ago -right hand(tx. with oral antibiotic)-no further issues    Past Surgical History  Procedure Laterality Date  . Hernia repair  4097, 02/17/31    umbilical mesh placed in August 2015.  . Wisdom tooth extraction    . Hernia repair  02-05-15    bilateral inguinal - mesh  . Varicose vein Left 2016    past years bilateral  . Hand surgery      cyst removed from rt hand  . Melanoma excision Right     '08- right cheek- no futher issues  . Colonoscopy with propofol N/A 10/02/2015    Procedure: COLONOSCOPY WITH PROPOFOL;  Surgeon: Garlan Fair, MD;  [facility];  Service: Endoscopy;  Laterality: N/A;    There were no vitals filed for this visit.  Visit Diagnosis:  Thumb pain, left  SI (sacroiliac) pain      Subjective Assessment - 02/14/16 1028    Subjective back 80% better, thumb  50%. Doing HEPs   Currently in Pain? Yes   Pain Score 3    Pain Location Hand                         [facility] Adult PT Treatment/Exercise - 02/14/16 0001    Lumbar Exercises: Aerobic   Stationary Bike L 5 6 min   Lumbar Exercises: Machines for Strengthening   Cybex Knee Extension 10# 2 sets 10   Cybex Knee Flexion 15 # 2 sets 10   Leg Press 20# 2 sets 10   Other Lumbar Machine Exercise lat pull and seated row 15 # 2 sets 10   Iontophoresis   Type of Iontophoresis Dexamethasone   Location left thumb   Dose 1.2cc   Time 4 hour                PT Education - 02/14/16 1030    Education provided Yes   Education Details start back walking and and add Nustep at Y, Gentle thumb/hand ex. Reviewed need for brace on hand with activity.   Person(s) Educated Patient   Methods Explanation;Demonstration   Comprehension Verbalized understanding;Returned demonstration          PT Short Term Goals - 02/07/16 1135  PT SHORT TERM GOAL #1   Title understand her HEP   Status Achieved           PT Long Term Goals - 02/14/16 1031    PT LONG TERM GOAL #1   Title Decrease pain 50%   Status Achieved   PT LONG TERM GOAL #2   Title walk without pain   Status Partially Met   PT LONG TERM GOAL #3   Title understand how important the exercises are for stability and how to correct an innominate rotation   Status Achieved   PT LONG TERM GOAL #4   Title do ADL's without pain in the thumb   Status On-going               Problem List Patient Active Problem List   Diagnosis Date Noted  . Gastroesophageal reflux disease without esophagitis 01/07/2016  . Insect sting allergy, current reaction 01/07/2016  . Rhinitis, allergic 08/25/2015  . Recurrent ventral hernia 05/31/2014    PAYSEUR,ANGIE PTA 02/14/2016, 10:44 AM  [place], [place], [address] Phone: [phone]    Fax:  [phone]  Name: Teresa Morales MRN: [id number] Date of Birth: 08-Dec-1941

## 2016-02-15 DIAGNOSIS — M1812 Unilateral primary osteoarthritis of first carpometacarpal joint, left hand: Secondary | ICD-10-CM | POA: Diagnosis not present

## 2016-02-20 ENCOUNTER — Ambulatory Visit (INDEPENDENT_AMBULATORY_CARE_PROVIDER_SITE_OTHER): Payer: Medicare Other

## 2016-02-20 DIAGNOSIS — J309 Allergic rhinitis, unspecified: Secondary | ICD-10-CM

## 2016-02-26 DIAGNOSIS — M545 Low back pain: Secondary | ICD-10-CM | POA: Diagnosis not present

## 2016-02-26 DIAGNOSIS — M47816 Spondylosis without myelopathy or radiculopathy, lumbar region: Secondary | ICD-10-CM | POA: Diagnosis not present

## 2016-02-28 ENCOUNTER — Ambulatory Visit (INDEPENDENT_AMBULATORY_CARE_PROVIDER_SITE_OTHER): Payer: Medicare Other

## 2016-02-28 ENCOUNTER — Encounter: Payer: Self-pay | Admitting: Physical Therapy

## 2016-02-28 ENCOUNTER — Ambulatory Visit: Payer: Medicare Other | Admitting: Physical Therapy

## 2016-02-28 DIAGNOSIS — J309 Allergic rhinitis, unspecified: Secondary | ICD-10-CM

## 2016-02-28 DIAGNOSIS — M533 Sacrococcygeal disorders, not elsewhere classified: Secondary | ICD-10-CM | POA: Diagnosis not present

## 2016-02-28 DIAGNOSIS — M79645 Pain in left finger(s): Secondary | ICD-10-CM | POA: Diagnosis not present

## 2016-02-28 NOTE — Therapy (Signed)
[address] [place], [place], [address] Phone: [phone]   Fax:  [phone]  Physical Therapy Treatment  Patient Details  Name: Teresa Morales MRN: [id number] Date of Birth: 07/01/1941 Referring Provider: Apolonio Schneiders for the [name] for back  Encounter Date: 02/28/2016      PT End of Session - 02/28/16 1156    Visit Number 10   Date for PT Re-Evaluation 03/15/16   PT Start Time 1125   PT Stop Time 1215   PT Time Calculation (min) 50 min      Past Medical History  Diagnosis Date  . Osteopenia     intolerant of bisphosphonates  . SIADH (syndrome of inappropriate ADH production) ([place]) 1998  . Endocervical polyp 1997  . Urethral stenosis     dilated  . Mild dysplasia of cervix     laser  . 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 Interstitial cystitis 2004  . GERD (gastroesophageal reflux disease)   . Melanoma of face ([place]) 04/2007  . Environmental allergies   . Hypertension   . MRSA infection     many yrs ago -right hand(tx. with oral antibiotic)-no further issues    Past Surgical History  Procedure Laterality Date  . Hernia repair  [id number], [id number]    umbilical mesh placed in August 2015.  . Wisdom tooth extraction    . Hernia repair  02-05-15    bilateral inguinal - mesh  . Varicose vein Left 2016    past years bilateral  . Hand surgery      cyst removed from rt hand  . Melanoma excision Right     '08- right cheek- no futher issues  . Colonoscopy with propofol N/A 10/02/2015    Procedure: COLONOSCOPY WITH PROPOFOL;  Surgeon: Garlan Fair, MD;  [facility];  Service: Endoscopy;  Laterality: N/A;    There were no vitals filed for this visit.  Visit Diagnosis:  SI (sacroiliac) pain      Subjective Assessment - 02/28/16 1154    Subjective doing well until i vaccummed last week   Currently in Pain? Yes   Pain Score 3    Pain Location Back   Pain Orientation Right                         [facility] Adult PT Treatment/Exercise - 02/28/16 0001    Traction   Type of Traction Lumbar  with MH   Min (lbs) 40                PT Education - 02/28/16 1155    Education provided Yes   Education Details BM,maintainance, HEP review, ADLs   Person(s) Educated Patient   Methods Explanation;Demonstration   Comprehension Verbalized understanding;Returned demonstration          PT Short Term Goals - 02/07/16 1135    PT SHORT TERM GOAL #1   Title understand her HEP   Status Achieved           PT Long Term Goals - 02/28/16 1157    PT LONG TERM GOAL #1   Status Achieved   PT LONG TERM GOAL #2   Title walk without pain   Status Achieved   PT LONG TERM GOAL #3   Title understand how important the exercises are for stability and how to  correct an innominate rotation   Status Achieved   PT LONG TERM GOAL #4   Title do ADL's without pain in the thumb   Status On-going               Plan - 02/28/16 1156    Clinical Impression Statement pt frustrated with set back, but felt better after BM education and maintance discussion.    PT Next Visit Plan Pt worried about f6 hour flight this weekend flaring her up. Hold until next week.        Problem List Patient Active Problem List   Diagnosis Date Noted  . Gastroesophageal reflux disease without esophagitis 01/07/2016  . Insect sting allergy, current reaction 01/07/2016  . Rhinitis, allergic 08/25/2015  . Recurrent ventral hernia 05/31/2014    Iveliz Garay,ANGIE PTA 02/28/2016, 11:58 AM  [place], [place], [address] Phone: [phone]   Fax:  [phone]  Name: Teresa Morales MRN: [id number] Date of Birth: Nov 19, 1941

## 2016-03-03 ENCOUNTER — Encounter: Payer: Self-pay | Admitting: Nurse Practitioner

## 2016-03-03 ENCOUNTER — Ambulatory Visit (INDEPENDENT_AMBULATORY_CARE_PROVIDER_SITE_OTHER): Payer: Medicare Other | Admitting: Nurse Practitioner

## 2016-03-03 VITALS — BP 160/90 | HR 80 | Resp 16 | Wt 119.0 lb

## 2016-03-03 DIAGNOSIS — N760 Acute vaginitis: Secondary | ICD-10-CM | POA: Diagnosis not present

## 2016-03-03 NOTE — Patient Instructions (Signed)

## 2016-03-03 NOTE — Progress Notes (Signed)
75 y.o. Married Caucasian female G1P1001 here with complaint of vaginal symptoms of itching, burning, and increase discharge. Describes discharge as thin.  Symptoms were on and off.  Has been using Estrace twice a week.  Then using coconut oil  As needed.  this weekend had external vulvar burning and pain inside the vagina.  In general not feeling well. Onset of symptoms 7 days ago. Denies new personal products or vaginal dryness. Urinary symptoms none but goes often but in relationship to fluid is normal.  No abdominal pain but had some chills.  She was seen in December and treated for BV.   O:  Healthy female WDWN Affect: normal, orientation x 3  Exam: no distress Abdomen: soft and non tender, no flank pain Lymph node: no enlargement or tenderness Pelvic exam: External genital: normal female BUS: negative Vagina: this white discharge noted.  Affirm taken.  A: Vaginitis   P: Discussed findings of vaginitis and etiology. Discussed Aveeno or baking soda sitz bath for comfort. Avoid moist clothes or pads for extended period of time. If working out in gym clothes or swim suits for long periods of time change underwear or bottoms of swimsuit if possible. Olive Oil/Coconut Oil use for skin protection prior to activity can be used to external skin.  Rx: will hold until results  She will use the Nystatin cream and Triamcinolone cream at home prn comfort.  Follow with [facility]  RV prn

## 2016-03-04 ENCOUNTER — Other Ambulatory Visit: Payer: Self-pay | Admitting: Nurse Practitioner

## 2016-03-04 ENCOUNTER — Ambulatory Visit: Payer: Medicare Other | Admitting: Certified Nurse Midwife

## 2016-03-04 LAB — WET PREP BY MOLECULAR PROBE
CANDIDA SPECIES: NEGATIVE
GARDNERELLA VAGINALIS: POSITIVE — AB
TRICHOMONAS VAG: NEGATIVE

## 2016-03-04 MED ORDER — METRONIDAZOLE 0.75 % VA GEL: 1.0000 | Freq: Every day | VAGINAL | Status: DC

## 2016-03-04 NOTE — Progress Notes (Signed)
Encounter reviewed by Dr. Brook Amundson C. Silva.  

## 2016-03-12 ENCOUNTER — Ambulatory Visit (INDEPENDENT_AMBULATORY_CARE_PROVIDER_SITE_OTHER): Payer: Medicare Other

## 2016-03-12 DIAGNOSIS — J309 Allergic rhinitis, unspecified: Secondary | ICD-10-CM | POA: Diagnosis not present

## 2016-03-13 NOTE — Therapy (Signed)
[address], [place], [address] Phone: [phone]   Fax:  [phone]  Physical Therapy Treatment  Patient Details  Name: Teresa Morales MRN: [id number] Date of Birth: 10-28-1941 Referring Provider: Apolonio Schneiders for the [name] for back  Encounter Date: 02/28/2016    Past Medical History  Diagnosis Date  . Osteopenia     intolerant of bisphosphonates  . SIADH (syndrome of inappropriate ADH production) ([place]) 1998  . Endocervical polyp 1997  . Urethral stenosis     dilated  . Mild dysplasia of cervix     laser  . 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 Interstitial cystitis 2004  . GERD (gastroesophageal reflux disease)   . Melanoma of face ([place]) 04/2007  . Environmental allergies   . Hypertension   . MRSA infection     many yrs ago -right hand(tx. with oral antibiotic)-no further issues    Past Surgical History  Procedure Laterality Date  . Hernia repair  1287, 07/21/75    umbilical mesh placed in August 2015.  . Wisdom tooth extraction    . Hernia repair  02-05-15    bilateral inguinal - mesh  . Varicose vein Left 2016    past years bilateral  . Hand surgery      cyst removed from rt hand  . Melanoma excision Right     '08- right cheek- no futher issues  . Colonoscopy with propofol N/A 10/02/2015    Procedure: COLONOSCOPY WITH PROPOFOL;  Surgeon: Garlan Fair, MD;  Location: [facility];  Service: Endoscopy;  Laterality: N/A;    There were no vitals filed for this visit.  Visit Diagnosis:  SI (sacroiliac) pain                                 PT Short Term Goals - 02/07/16 1135    PT SHORT TERM GOAL #1   Title understand her HEP   Status Achieved           PT Long Term Goals - 02/28/16 1157    PT LONG TERM GOAL #1   Status Achieved   PT LONG TERM GOAL  #2   Title walk without pain   Status Achieved   PT LONG TERM GOAL #3   Title understand how important the exercises are for stability and how to correct an innominate rotation   Status Achieved   PT LONG TERM GOAL #4   Title do ADL's without pain in the thumb   Status On-going      PHYSICAL THERAPY DISCHARGE SUMMARY    Plan: Patient agrees to discharge.  Patient goals were met. Patient is being discharged due to meeting the stated rehab goals.  ?????               Problem List Patient Active Problem List   Diagnosis Date Noted  . Gastroesophageal reflux disease without esophagitis 01/07/2016  . Insect sting allergy, current reaction 01/07/2016  . Rhinitis, allergic 08/25/2015  . Recurrent ventral hernia 05/31/2014    Asha Grumbine,ANGIE 03/13/2016, 10:26 AM  [address] [place], [place], [address] Phone: [phone]   Fax:  [phone]  Name: Teresa Morales MRN: [id number] Date of Birth: 08-Mar-1941

## 2016-03-18 ENCOUNTER — Ambulatory Visit (INDEPENDENT_AMBULATORY_CARE_PROVIDER_SITE_OTHER): Payer: Medicare Other | Admitting: *Deleted

## 2016-03-18 DIAGNOSIS — H6120 Impacted cerumen, unspecified ear: Secondary | ICD-10-CM | POA: Diagnosis not present

## 2016-03-18 DIAGNOSIS — M4328 Fusion of spine, sacral and sacrococcygeal region: Secondary | ICD-10-CM | POA: Diagnosis not present

## 2016-03-18 DIAGNOSIS — I10 Essential (primary) hypertension: Secondary | ICD-10-CM | POA: Diagnosis not present

## 2016-03-18 DIAGNOSIS — J309 Allergic rhinitis, unspecified: Secondary | ICD-10-CM

## 2016-03-24 ENCOUNTER — Ambulatory Visit (INDEPENDENT_AMBULATORY_CARE_PROVIDER_SITE_OTHER): Payer: Medicare Other

## 2016-03-24 DIAGNOSIS — T63441D Toxic effect of venom of bees, accidental (unintentional), subsequent encounter: Secondary | ICD-10-CM | POA: Diagnosis not present

## 2016-03-26 ENCOUNTER — Ambulatory Visit: Payer: Medicare Other | Admitting: Obstetrics and Gynecology

## 2016-03-27 ENCOUNTER — Ambulatory Visit (INDEPENDENT_AMBULATORY_CARE_PROVIDER_SITE_OTHER): Payer: Medicare Other

## 2016-03-27 DIAGNOSIS — J309 Allergic rhinitis, unspecified: Secondary | ICD-10-CM | POA: Diagnosis not present

## 2016-04-07 ENCOUNTER — Ambulatory Visit (INDEPENDENT_AMBULATORY_CARE_PROVIDER_SITE_OTHER): Payer: Medicare Other

## 2016-04-07 DIAGNOSIS — J309 Allergic rhinitis, unspecified: Secondary | ICD-10-CM

## 2016-04-09 ENCOUNTER — Ambulatory Visit (INDEPENDENT_AMBULATORY_CARE_PROVIDER_SITE_OTHER): Payer: Medicare Other | Admitting: Obstetrics and Gynecology

## 2016-04-09 ENCOUNTER — Encounter: Payer: Self-pay | Admitting: Obstetrics and Gynecology

## 2016-04-09 VITALS — BP 134/72 | HR 76 | Resp 14 | Ht 63.0 in | Wt 120.6 lb

## 2016-04-09 DIAGNOSIS — Z124 Encounter for screening for malignant neoplasm of cervix: Secondary | ICD-10-CM

## 2016-04-09 DIAGNOSIS — N9489 Other specified conditions associated with female genital organs and menstrual cycle: Secondary | ICD-10-CM | POA: Diagnosis not present

## 2016-04-09 DIAGNOSIS — Z01419 Encounter for gynecological examination (general) (routine) without abnormal findings: Secondary | ICD-10-CM

## 2016-04-09 DIAGNOSIS — R102 Pelvic and perineal pain: Secondary | ICD-10-CM

## 2016-04-09 DIAGNOSIS — M8580 Other specified disorders of bone density and structure, unspecified site: Secondary | ICD-10-CM

## 2016-04-09 LAB — POCT URINALYSIS DIPSTICK
Bilirubin, UA: NEGATIVE
GLUCOSE UA: NEGATIVE
Ketones, UA: NEGATIVE
LEUKOCYTES UA: NEGATIVE
NITRITE UA: NEGATIVE
PH UA: 5
PROTEIN UA: NEGATIVE
RBC UA: NEGATIVE
UROBILINOGEN UA: NEGATIVE

## 2016-04-09 MED ORDER — ESTRADIOL 0.1 MG/GM VA CREA
TOPICAL_CREAM | VAGINAL | Status: DC
Start: 2016-04-09 — End: 2016-12-18

## 2016-04-09 NOTE — Progress Notes (Signed)
Patient ID: Teresa Morales, female   DOB: 11/05/1941, 75 y.o.   MRN: [id number] 76 y.o. G60P1001 Married Caucasian female here for annual exam.    Has been treated for bacterial vaginosis.  Has been treated with the Metrogel.  Does not douche. Has vaginal Estrace cream Rx.  Using this twice a week.  Using coconut oil also.   Has osteopenia and has intolerance of bisphosphonates.  Had reflux with Fosamax.  Had joint aching with Actonel. Boniva caused joint aching.  Told to consider Prolia in 2014.  Lower abdominal pain for 2 days.  States she thinks it is GI.  Mild GSI with cough/sneeze.   Struggles with DJD of spine.  Has a physical therapist.  Using a TENS unit. Also goes to the [facility].  PCP:  R.Mertha Finders, MD  Patient's last menstrual period was 12/15/1993 (approximate).           Sexually active: Yes.   female The current method of family planning is post menopausal status.    Exercising: Yes.    walking and lower back exercises. Smoker:  no  Health Maintenance: Pap:  02-16-14 Neg History of abnormal Pap:  Yes, 6/89 Laser vaporization of cervix for mild dysplasia. MMG:  03-22-15 3D Bil.Diag--for Lt.nipple pain/Density C/scattered benign appearing calcifications in both breasts--U/S of Lt.minimal duct ectasia Lt.br. Probably benign  f/u 6 mo/BiRads3:[facility] 10-11-15 Lt.br. U/S Neg/BiR1--mmg 71mo:[facility]---HAS APPT. NEXT WEEK Colonoscopy: 09/2015 normal Dr. Bonnita Nasuti more needed due to  BMD:   03-22-15  Result Osteopenia spine slightly increased and left hip improved:[facility].  Right hip unchanged.  Lowest T score is - 2.3 and is the right hip. TDaP:  PCP Gardasil:   N/A Screening Labs:  Hb today: PCP, Urine today: negative.   reports that she has never smoked. She has never used smokeless tobacco. She reports that she does not drink alcohol or use illicit drugs.  Past Medical History  Diagnosis Date  . Osteopenia     intolerant of bisphosphonates  . SIADH (syndrome of  inappropriate ADH production) ([place]) 1998  . Endocervical polyp 1997  . Urethral stenosis     dilated  . Mild dysplasia of cervix     laser  . 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 Interstitial cystitis 2004  . GERD (gastroesophageal reflux disease)   . Melanoma of face ([facility]) 04/2007  . Environmental allergies   . Hypertension   . MRSA infection     many yrs ago -right hand(tx. with oral antibiotic)-no further issues  . Disc degeneration, lumbar 2016    L4, L5, S1  . Arthritis     left thumb and lower back,    Past Surgical History  Procedure Laterality Date  . Hernia repair  [id number], [id number]    umbilical mesh placed in August 2015.  . Wisdom tooth extraction    . Hernia repair  02-05-15    bilateral inguinal - mesh  . Varicose vein Left 2016    past years bilateral  . Hand surgery      cyst removed from rt hand  . Melanoma excision Right     '08- right cheek- no futher issues  . Colonoscopy with propofol N/A 10/02/2015    Procedure: COLONOSCOPY WITH PROPOFOL;  Surgeon: Garlan Fair, MD;  Location: [facility];  Service: Endoscopy;  Laterality: N/A;    Current Outpatient Prescriptions  Medication Sig Dispense Refill  .  azelastine (ASTELIN) 0.1 % nasal spray Place 2 sprays into both nostrils 2 (two) times daily. Use in each nostril as directed 30 mL 4  . Calcium Citrate-Vitamin D (CITRACAL + D PO) Take 2 tablets by mouth 2 (two) times daily.     Marland Kitchen CHERRY PO Take 4 tablets by mouth daily. Tart Cherry Extract    . cholecalciferol (VITAMIN D) 1000 UNITS tablet Take 1,000 Units by mouth daily.    Marland Kitchen EPIPEN 2-PAK 0.3 MG/0.3ML SOAJ injection Place 0.3 mg into alternate nostrils once. Reported on 12/12/2015    . estradiol (ESTRACE) 0.1 MG/GM vaginal cream Use 1/2 g vaginally two or three times per week. (Patient taking differently: Place 1 Applicatorful vaginally 2 (two) times a week. Use 1/2 g)  42.5 g 3  . Eszopiclone (ESZOPICLONE) 3 MG TABS Take 3 mg by mouth as needed (for sleep). Take immediately before bedtime    . Lactobacillus Rhamnosus, GG, (CULTURELLE PO) Take 1 tablet by mouth daily.     Marland Kitchen levocetirizine (XYZAL) 5 MG tablet TAKE 1 TABLET BY MOUTH DAILY 30 tablet 5  . Misc Natural Products (OSTEO BI-FLEX JOINT SHIELD PO) Take 2 tablets by mouth daily.     . montelukast (SINGULAIR) 10 MG tablet Take one tablet each evening to prevent cough or wheeze. 30 tablet 5  . Multiple Vitamin (MULTIVITAMIN) capsule Take 1 capsule by mouth daily.    Marland Kitchen nystatin-triamcinolone (MYCOLOG II) cream Apply 1 application topically 2 (two) times daily. Apply to affected area BID for up to 5 days (Patient taking differently: Apply 1 application topically 2 (two) times daily as needed. Apply to affected area BID for up to 5 days) 60 g 0  . OMEGA 3 1000 MG CAPS Take 1 capsule by mouth daily.     Marland Kitchen omeprazole (PRILOSEC) 20 MG capsule Take 20 mg by mouth 2 (two) times daily.     Marland Kitchen OVER THE COUNTER MEDICATION Coconut oil -Takes 5 days a week vaginally and externally(uses 1gm(?) on the days she doesn't use Estrace cream    . PRESCRIPTION MEDICATION ALLERGY INJECTION  Takes twice weekly    . Triamcinolone Acetonide (NASACORT ALLERGY 24HR NA) Place 1 spray into the nose daily.     Marland Kitchen triamcinolone cream (KENALOG) 0.1 % Apply 1 application topically as needed (for rash).     . triamterene-hydrochlorothiazide (MAXZIDE-25) 37.5-25 MG per tablet Take 0.5 tablets by mouth daily.    . valsartan (DIOVAN) 160 MG tablet TK 1 T PO QD IN THE EVE  5   No current facility-administered medications for this visit.    Family History  Problem Relation Age of Onset  . Other Mother 37    MVA   . Lupus Sister 103  . Prostate cancer Brother   . Hypertension Father   . Osteoporosis Sister   . Thyroid disease Sister   . Scoliosis Sister     ROS:  Pertinent items are noted in HPI.  Otherwise, a comprehensive ROS was  negative.  Exam:   BP 134/72 mmHg  Pulse 76  Resp 14  Ht 5\' 3"  (1.6 m)  Wt 120 lb 9.6 oz (54.704 kg)  BMI 21.37 kg/m2  LMP 12/15/1993 (Approximate)    General appearance: alert, cooperative and appears stated age Head: Normocephalic, without obvious abnormality, atraumatic Neck: no adenopathy, supple, symmetrical, trachea midline and thyroid normal to inspection and palpation Lungs: clear to auscultation bilaterally Breasts: normal appearance, no masses or tenderness, Inspection negative, No nipple retraction or dimpling,  No nipple discharge or bleeding, No axillary or supraclavicular adenopathy Heart: regular rate and rhythm Abdomen: incisions:  Yes.   , soft, non-tender; no masses, no organomegaly Extremities: extremities normal, atraumatic, no cyanosis or edema Skin: Skin color, texture, turgor normal. No rashes or lesions Lymph nodes: Cervical, supraclavicular, and axillary nodes normal. No abnormal inguinal nodes palpated Neurologic: Grossly normal  Pelvic: External genitalia:  no lesions              Urethra:  normal appearing urethra with no masses, tenderness or lesions              Bartholins and Skenes: normal                 Vagina: normal appearing vagina with normal color and discharge, no lesions              Cervix: no lesions              Pap taken: Yes.   Bimanual Exam:  Uterus:  normal size, contour, position, consistency, mobility, non-tender              Adnexa: normal adnexa and no mass, fullness, tenderness              Rectal exam: Yes.  .  Confirms.              Anus:  normal sphincter tone, no lesions  Chaperone was present for exam.  Assessment:   Well woman visit with normal exam. Remote hx of cervical dysplasia.  Osteopenia. Intolerance to bisphosfonates. Lower abdominal pain.  Negative urine dip. Recurrent BV.   Plan: Yearly mammogram recommended after age 18.  Recommended self breast exam.  Pap and HR HPV as above. Discussed Calcium,  Vitamin D, regular exercise program including cardiovascular and weight bearing exercise. Labs performed.  Yes.  .   See orders.  Urine dip.  Prescription medication(s) given.  No..  See orders. BMD next year.  Follow up annually and prn.       After visit summary provided.

## 2016-04-09 NOTE — Patient Instructions (Signed)
EXERCISE AND DIET:  We recommended that you start or continue a regular exercise program for good health. Regular exercise means any activity that makes your heart beat faster and makes you sweat.  We recommend exercising at least 30 minutes per day at least 3 days a week, preferably 4 or 5.  We also recommend a diet low in fat and sugar.  Inactivity, poor dietary choices and obesity can cause diabetes, heart attack, stroke, and kidney damage, among others.    ALCOHOL AND SMOKING:  Women should limit their alcohol intake to no more than 7 drinks/beers/glasses of wine (combined, not each!) per week. Moderation of alcohol intake to this level decreases your risk of breast cancer and liver damage. And of course, no recreational drugs are part of a healthy lifestyle.  And absolutely no smoking or even second hand smoke. Most people know smoking can cause heart and lung diseases, but did you know it also contributes to weakening of your bones? Aging of your skin?  Yellowing of your teeth and nails?  CALCIUM AND VITAMIN D:  Adequate intake of calcium and Vitamin D are recommended.  The recommendations for exact amounts of these supplements seem to change often, but generally speaking 600 mg of calcium (either carbonate or citrate) and 800 units of Vitamin D per day seems prudent. Certain women may benefit from higher intake of Vitamin D.  If you are among these women, your doctor will have told you during your visit.    PAP SMEARS:  Pap smears, to check for cervical cancer or precancers,  have traditionally been done yearly, although recent scientific advances have shown that most women can have pap smears less often.  However, every woman still should have a physical exam from her gynecologist every year. It will include a breast check, inspection of the vulva and vagina to check for abnormal growths or skin changes, a visual exam of the cervix, and then an exam to evaluate the size and shape of the uterus and  ovaries.  And after 75 years of age, a rectal exam is indicated to check for rectal cancers. We will also provide age appropriate advice regarding health maintenance, like when you should have certain vaccines, screening for sexually transmitted diseases, bone density testing, colonoscopy, mammograms, etc.   MAMMOGRAMS:  All women over 40 years old should have a yearly mammogram. Many facilities now offer a "3D" mammogram, which may cost around $50 extra out of pocket. If possible,  we recommend you accept the option to have the 3D mammogram performed.  It both reduces the number of women who will be called back for extra views which then turn out to be normal, and it is better than the routine mammogram at detecting truly abnormal areas.    COLONOSCOPY:  Colonoscopy to screen for colon cancer is recommended for all women at age 50.  We know, you hate the idea of the prep.  We agree, BUT, having colon cancer and not knowing it is worse!!  Colon cancer so often starts as a polyp that can be seen and removed at colonscopy, which can quite literally save your life!  And if your first colonoscopy is normal and you have no family history of colon cancer, most women don't have to have it again for 10 years.  Once every ten years, you can do something that may end up saving your life, right?  We will be happy to help you get it scheduled when you are ready.    Be sure to check your insurance coverage so you understand how much it will cost.  It may be covered as a preventative service at no cost, but you should check your particular policy.     Denosumab injection What is this medicine? DENOSUMAB (den oh sue mab) slows bone breakdown. Prolia is used to treat osteoporosis in women after menopause and in men. Delton See is used to prevent bone fractures and other bone problems caused by cancer bone metastases. Delton See is also used to treat giant cell tumor of the bone. This medicine may be used for other purposes; ask your  health care provider or pharmacist if you have questions. What should I tell my health care provider before I take this medicine? They need to know if you have any of these conditions: -dental disease -eczema -infection or history of infections -kidney disease or on dialysis -low blood calcium or vitamin D -malabsorption syndrome -scheduled to have surgery or tooth extraction -taking medicine that contains denosumab -thyroid or parathyroid disease -an unusual reaction to denosumab, other medicines, foods, dyes, or preservatives -pregnant or trying to get pregnant -breast-feeding How should I use this medicine? This medicine is for injection under the skin. It is given by a health care professional in a hospital or clinic setting. If you are getting Prolia, a special MedGuide will be given to you by the pharmacist with each prescription and refill. Be sure to read this information carefully each time. For Prolia, talk to your pediatrician regarding the use of this medicine in children. Special care may be needed. For Delton See, talk to your pediatrician regarding the use of this medicine in children. While this drug may be prescribed for children as young as 13 years for selected conditions, precautions do apply. Overdosage: If you think you have taken too much of this medicine contact a poison control center or emergency room at once. NOTE: This medicine is only for you. Do not share this medicine with others. What if I miss a dose? It is important not to miss your dose. Call your doctor or health care professional if you are unable to keep an appointment. What may interact with this medicine? Do not take this medicine with any of the following medications: -other medicines containing denosumab This medicine may also interact with the following medications: -medicines that suppress the immune system -medicines that treat cancer -steroid medicines like prednisone or cortisone This list may  not describe all possible interactions. Give your health care provider a list of all the medicines, herbs, non-prescription drugs, or dietary supplements you use. Also tell them if you smoke, drink alcohol, or use illegal drugs. Some items may interact with your medicine. What should I watch for while using this medicine? Visit your doctor or health care professional for regular checks on your progress. Your doctor or health care professional may order blood tests and other tests to see how you are doing. Call your doctor or health care professional if you get a cold or other infection while receiving this medicine. Do not treat yourself. This medicine may decrease your body's ability to fight infection. You should make sure you get enough calcium and vitamin D while you are taking this medicine, unless your doctor tells you not to. Discuss the foods you eat and the vitamins you take with your health care professional. See your dentist regularly. Brush and floss your teeth as directed. Before you have any dental work done, tell your dentist you are receiving this medicine. Do  not become pregnant while taking this medicine or for 5 months after stopping it. Women should inform their doctor if they wish to become pregnant or think they might be pregnant. There is a potential for serious side effects to an unborn child. Talk to your health care professional or pharmacist for more information. What side effects may I notice from receiving this medicine? Side effects that you should report to your doctor or health care professional as soon as possible: -allergic reactions like skin rash, itching or hives, swelling of the face, lips, or tongue -breathing problems -chest pain -fast, irregular heartbeat -feeling faint or lightheaded, falls -fever, chills, or any other sign of infection -muscle spasms, tightening, or twitches -numbness or tingling -skin blisters or bumps, or is dry, peels, or red -slow  healing or unexplained pain in the mouth or jaw -unusual bleeding or bruising Side effects that usually do not require medical attention (Report these to your doctor or health care professional if they continue or are bothersome.): -muscle pain -stomach upset, gas This list may not describe all possible side effects. Call your doctor for medical advice about side effects. You may report side effects to FDA at 1-800-FDA-1088. Where should I keep my medicine? This medicine is only given in a clinic, doctor's office, or other health care setting and will not be stored at home. NOTE: This sheet is a summary. It may not cover all possible information. If you have questions about this medicine, talk to your doctor, pharmacist, or health care provider.    2016, [employer]vier/[employer] Standard. (2012-05-31 12:37:47)

## 2016-04-10 LAB — IPS PAP SMEAR ONLY

## 2016-04-17 ENCOUNTER — Ambulatory Visit (INDEPENDENT_AMBULATORY_CARE_PROVIDER_SITE_OTHER): Payer: Medicare Other

## 2016-04-17 DIAGNOSIS — J309 Allergic rhinitis, unspecified: Secondary | ICD-10-CM

## 2016-04-17 DIAGNOSIS — N6042 Mammary duct ectasia of left breast: Secondary | ICD-10-CM | POA: Diagnosis not present

## 2016-04-21 ENCOUNTER — Ambulatory Visit (INDEPENDENT_AMBULATORY_CARE_PROVIDER_SITE_OTHER): Payer: Medicare Other | Admitting: *Deleted

## 2016-04-21 DIAGNOSIS — T63441D Toxic effect of venom of bees, accidental (unintentional), subsequent encounter: Secondary | ICD-10-CM | POA: Diagnosis not present

## 2016-04-28 ENCOUNTER — Ambulatory Visit (INDEPENDENT_AMBULATORY_CARE_PROVIDER_SITE_OTHER): Payer: Medicare Other | Admitting: *Deleted

## 2016-04-28 DIAGNOSIS — J309 Allergic rhinitis, unspecified: Secondary | ICD-10-CM | POA: Diagnosis not present

## 2016-05-08 ENCOUNTER — Ambulatory Visit (INDEPENDENT_AMBULATORY_CARE_PROVIDER_SITE_OTHER): Payer: Medicare Other

## 2016-05-08 DIAGNOSIS — J309 Allergic rhinitis, unspecified: Secondary | ICD-10-CM

## 2016-05-16 ENCOUNTER — Ambulatory Visit (INDEPENDENT_AMBULATORY_CARE_PROVIDER_SITE_OTHER): Payer: Medicare Other | Admitting: *Deleted

## 2016-05-16 DIAGNOSIS — J309 Allergic rhinitis, unspecified: Secondary | ICD-10-CM | POA: Diagnosis not present

## 2016-05-19 ENCOUNTER — Ambulatory Visit (INDEPENDENT_AMBULATORY_CARE_PROVIDER_SITE_OTHER): Payer: Medicare Other

## 2016-05-19 DIAGNOSIS — T63441D Toxic effect of venom of bees, accidental (unintentional), subsequent encounter: Secondary | ICD-10-CM

## 2016-05-19 NOTE — Progress Notes (Signed)
This encounter was created in error - please disregard.

## 2016-05-30 ENCOUNTER — Ambulatory Visit (INDEPENDENT_AMBULATORY_CARE_PROVIDER_SITE_OTHER): Payer: Medicare Other | Admitting: *Deleted

## 2016-05-30 DIAGNOSIS — J309 Allergic rhinitis, unspecified: Secondary | ICD-10-CM

## 2016-06-06 ENCOUNTER — Ambulatory Visit (INDEPENDENT_AMBULATORY_CARE_PROVIDER_SITE_OTHER): Payer: Medicare Other

## 2016-06-06 DIAGNOSIS — J309 Allergic rhinitis, unspecified: Secondary | ICD-10-CM | POA: Diagnosis not present

## 2016-06-08 ENCOUNTER — Other Ambulatory Visit: Payer: Self-pay | Admitting: Allergy and Immunology

## 2016-06-19 ENCOUNTER — Ambulatory Visit (INDEPENDENT_AMBULATORY_CARE_PROVIDER_SITE_OTHER): Payer: Medicare Other

## 2016-06-19 DIAGNOSIS — J309 Allergic rhinitis, unspecified: Secondary | ICD-10-CM

## 2016-06-23 ENCOUNTER — Ambulatory Visit (INDEPENDENT_AMBULATORY_CARE_PROVIDER_SITE_OTHER): Payer: Medicare Other | Admitting: *Deleted

## 2016-06-23 DIAGNOSIS — T63441D Toxic effect of venom of bees, accidental (unintentional), subsequent encounter: Secondary | ICD-10-CM | POA: Diagnosis not present

## 2016-06-25 ENCOUNTER — Other Ambulatory Visit: Payer: Self-pay | Admitting: Allergy and Immunology

## 2016-06-25 NOTE — Telephone Encounter (Signed)
Pt called and needs refill on epi-pen called into [facility] on [address]  [phone]

## 2016-06-26 MED ORDER — EPINEPHRINE 0.3 MG/0.3ML IJ SOAJ: 0.3000 mg | Freq: Once | INTRAMUSCULAR | Status: AC

## 2016-06-26 NOTE — Telephone Encounter (Signed)
Sent script for generic EpiPen to [facility].

## 2016-06-27 ENCOUNTER — Ambulatory Visit (INDEPENDENT_AMBULATORY_CARE_PROVIDER_SITE_OTHER): Payer: Medicare Other | Admitting: *Deleted

## 2016-06-27 DIAGNOSIS — J309 Allergic rhinitis, unspecified: Secondary | ICD-10-CM

## 2016-07-04 ENCOUNTER — Encounter: Payer: Self-pay | Admitting: *Deleted

## 2016-07-04 ENCOUNTER — Ambulatory Visit (INDEPENDENT_AMBULATORY_CARE_PROVIDER_SITE_OTHER): Payer: Medicare Other | Admitting: *Deleted

## 2016-07-04 DIAGNOSIS — J309 Allergic rhinitis, unspecified: Secondary | ICD-10-CM | POA: Diagnosis not present

## 2016-07-04 NOTE — Progress Notes (Signed)
Maintenance vial made for Mold

## 2016-07-09 DIAGNOSIS — J3089 Other allergic rhinitis: Secondary | ICD-10-CM | POA: Diagnosis not present

## 2016-07-10 DIAGNOSIS — H02054 Trichiasis without entropian left upper eyelid: Secondary | ICD-10-CM | POA: Diagnosis not present

## 2016-07-14 ENCOUNTER — Other Ambulatory Visit: Payer: Self-pay | Admitting: Allergy and Immunology

## 2016-07-15 ENCOUNTER — Other Ambulatory Visit: Payer: Self-pay | Admitting: Pediatrics

## 2016-07-15 DIAGNOSIS — Z0000 Encounter for general adult medical examination without abnormal findings: Secondary | ICD-10-CM | POA: Diagnosis not present

## 2016-07-15 DIAGNOSIS — K219 Gastro-esophageal reflux disease without esophagitis: Secondary | ICD-10-CM | POA: Diagnosis not present

## 2016-07-15 DIAGNOSIS — Z1389 Encounter for screening for other disorder: Secondary | ICD-10-CM | POA: Diagnosis not present

## 2016-07-15 DIAGNOSIS — G609 Hereditary and idiopathic neuropathy, unspecified: Secondary | ICD-10-CM | POA: Diagnosis not present

## 2016-07-15 DIAGNOSIS — M4328 Fusion of spine, sacral and sacrococcygeal region: Secondary | ICD-10-CM | POA: Diagnosis not present

## 2016-07-15 DIAGNOSIS — H6120 Impacted cerumen, unspecified ear: Secondary | ICD-10-CM | POA: Diagnosis not present

## 2016-07-15 DIAGNOSIS — I10 Essential (primary) hypertension: Secondary | ICD-10-CM | POA: Diagnosis not present

## 2016-07-15 DIAGNOSIS — H6123 Impacted cerumen, bilateral: Secondary | ICD-10-CM | POA: Diagnosis not present

## 2016-07-15 DIAGNOSIS — E559 Vitamin D deficiency, unspecified: Secondary | ICD-10-CM | POA: Diagnosis not present

## 2016-07-15 DIAGNOSIS — E782 Mixed hyperlipidemia: Secondary | ICD-10-CM | POA: Diagnosis not present

## 2016-07-15 DIAGNOSIS — G4700 Insomnia, unspecified: Secondary | ICD-10-CM | POA: Diagnosis not present

## 2016-07-16 ENCOUNTER — Ambulatory Visit: Payer: Medicare Other | Attending: Sports Medicine | Admitting: Physical Therapy

## 2016-07-16 ENCOUNTER — Encounter: Payer: Self-pay | Admitting: Physical Therapy

## 2016-07-16 DIAGNOSIS — M545 Low back pain, unspecified: Secondary | ICD-10-CM

## 2016-07-16 DIAGNOSIS — M533 Sacrococcygeal disorders, not elsewhere classified: Secondary | ICD-10-CM | POA: Diagnosis not present

## 2016-07-16 NOTE — Therapy (Signed)
[place] [place], [place], [address] Phone: [phone]   Fax:  [phone]  Physical Therapy Evaluation  Patient Details  Name: JAPNEET THUNDER MRN: [id number] Date of Birth: 24-Jul-1941 Referring Provider: Delilah Shan  Encounter Date: 07/16/2016      PT End of Session - 07/16/16 0851    Visit Number 1   Date for PT Re-Evaluation 09/15/16   PT Start Time 0814   PT Stop Time 0915   PT Time Calculation (min) 61 min   Activity Tolerance Patient tolerated treatment well   Behavior During Therapy [facility] for tasks assessed/performed      Past Medical History:  Diagnosis Date  . Arthritis    left thumb and lower back,  . Atypical glandular cells of undetermined significance (AGUS) on cervical Pap smear 12/1998   endo polyp- [name]  . Disc degeneration, lumbar 2016   L4, L5, S1  . Endocervical polyp 1997  . Environmental allergies   . GERD (gastroesophageal reflux disease)   . Hypertension   . Interstitial cystitis 2004  . Melanoma of face ([place]) 04/2007  . Mild dysplasia of cervix    laser  . MRSA infection    many yrs ago -right hand(tx. with oral antibiotic)-no further issues  . Osteopenia    intolerant of bisphosphonates  . 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 Urethral stenosis    dilated    Past Surgical History:  Procedure Laterality Date  . COLONOSCOPY WITH PROPOFOL N/A 10/02/2015   Procedure: COLONOSCOPY WITH PROPOFOL;  Surgeon: Garlan Fair, MD;  Location: [facility];  Service: Endoscopy;  Laterality: N/A;  . HAND SURGERY     cyst removed from rt hand  . HERNIA REPAIR  [id number], [id number]   umbilical mesh placed in August 2015.  Marland Kitchen HERNIA REPAIR  02-05-15   bilateral inguinal - mesh  . MELANOMA EXCISION Right    '08- right cheek- no futher issues  . varicose vein Left 2016   past years bilateral  . WISDOM TOOTH EXTRACTION      There were no vitals  filed for this visit.       Subjective Assessment - 07/16/16 0816    Subjective Patient is familiar to me as she has been seen here in the past for LBP/SI dysfunction issues.  She reports that around the 4th of July she was getting ready for church and felt immediate in the right SI area.  She reports that she tried a few of the past exercises we have given her and this has not helped.  She reports that she feels she is back in alignment but still hurting some.   Limitations Lifting;Standing;House hold activities   Patient Stated Goals have less pain   Currently in Pain? Yes   Pain Score 3    Pain Location Sacrum   Pain Orientation Right   Pain Descriptors / Indicators Aching;Sore   Pain Type Chronic pain   Pain Onset More than a month ago   Pain Frequency Constant   Aggravating Factors  toward the end of the day it is worse as is standing, pain up to 6/10, yardwork also really causes pain   Pain Relieving Factors rest, PT in the past has helped   Effect of Pain on Daily Activities difficulty with yardwork            [facility] PT Assessment - 07/16/16 0001  Assessment   Medical Diagnosis right SI pain'   Referring Provider Kendall   Onset Date/Surgical Date 06/15/16   Prior Therapy about a year ago     Precautions   Precautions None     Balance Screen   Has the patient fallen in the past 6 months No   Has the patient had a decrease in activity level because of a fear of falling?  No   Is the patient reluctant to leave their home because of a fear of falling?  No     Home Environment   Additional Comments does her own yardwork and housework     Prior Function   Level of Independence Independent   Vocation Retired   Leisure reports does Haematologist but no specific exercises     ROM / Strength   AROM / PROM / Strength AROM;Strength     AROM   Overall AROM Comments Lumbar ROM decreased 25% with pain in the left SI     Strength   Overall Strength Comments LE strength  4-/5 with some right SI pain with hip MMT     Flexibility   Soft Tissue Assessment /Muscle Length --  tightness in the quads, HS and piriformis mms     Palpation   SI assessment  everything is aligned today, she is tender in the SI with some pressure, we have seen her in the past where she is way out of alignment    Palpation comment very tight in the lumbar paraspinals, she has some knots in the right SI area, she is very tender here as well                   [facility] Adult PT Treatment/Exercise - 07/16/16 0001      Modalities   Modalities Electrical Stimulation;Moist Heat;Ultrasound     Moist Heat Therapy   Number Minutes Moist Heat 15 Minutes   Moist Heat Location Lumbar Spine     Electrical Stimulation   Electrical Stimulation Location right SI area   Electrical Stimulation Action IFC   Electrical Stimulation Parameters supine   Electrical Stimulation Goals Pain     Ultrasound   Ultrasound Location right SI area   Ultrasound Parameters 100% 1MHz 1.5 w/cm2   Ultrasound Goals Pain                PT Education - 07/16/16 0851    Education provided Yes   Education Details went over Kegel's exercises, gave stretches for the LE's   Person(s) Educated Patient   Methods Explanation;Demonstration;Handout   Comprehension Verbalized understanding          PT Short Term Goals - 07/16/16 0859      PT SHORT TERM GOAL #1   Title understand her HEP   Time 2   Period Weeks   Status New           PT Long Term Goals - 07/16/16 0859      PT LONG TERM GOAL #1   Title Decrease pain 50%   Time 8   Period Weeks   Status New     PT LONG TERM GOAL #2   Title walk without pain   Time 8   Period Weeks   Status New     PT LONG TERM GOAL #3   Title understand how important the exercises are for stability and how to correct an innominate rotation   Time 8   Period Weeks   Status New  PT LONG TERM GOAL #4   Title do yardwork and gardening without  significant increase of pain   Time 8   Period Weeks   Status New               Plan - 07/16/16 [id number]    Clinical Impression Statement Patient with a long history of SI pain.  She reports that she has had this pain for over a month now, she feels that she is back in alignment but still hurting, with assessment I do feel that she is in alignment but that she is suffering from spasms in the lumbar area and the SI area.  She is tight in the quads, the HS and piriformis mms   Rehab Potential Good   PT Frequency 2x / week   PT Duration 8 weeks   PT Treatment/Interventions ADLs/Self Care Home Management;Cryotherapy;Electrical Stimulation;Iontophoresis 4mg /ml Dexamethasone;Moist Heat;Traction;Ultrasound;Therapeutic activities;Therapeutic exercise;Neuromuscular re-education;Patient/family education;Manual techniques   PT Next Visit Plan continue to assess SI, she needs to be able to do self care at home and at a gym, she reports that she has neglected this int he past especially when she is not hurting   Consulted and Agree with Plan of Care Patient      Patient will benefit from skilled therapeutic intervention in order to improve the following deficits and impairments:  Decreased range of motion, Decreased strength, Difficulty walking, Increased muscle spasms, Increased fascial restricitons, Impaired flexibility, Improper body mechanics, Pain  Visit Diagnosis: Right-sided low back pain without sciatica - Plan: PT plan of care cert/re-cert      G-Codes - [id number] [id number]    Functional Assessment Tool Used foto 59% limitation   Functional Limitation Mobility: Walking and moving around   Mobility: Walking and Moving Around Current Status [id number]) At least 40 percent but less than 60 percent impaired, limited or restricted       Problem List Patient Active Problem List   Diagnosis Date Noted  . Gastroesophageal reflux disease without esophagitis 01/07/2016  . Insect sting allergy,  current reaction 01/07/2016  . Rhinitis, allergic 08/25/2015  . Recurrent ventral hernia 05/31/2014    Sumner Boast., PT 07/16/2016, 9:13 AM  [place], [place], [address] Phone: [phone]   Fax:  [phone]  Name: MISHIKA MERIWETHER MRN: [id number] Date of Birth: 1941/11/02

## 2016-07-21 ENCOUNTER — Ambulatory Visit (INDEPENDENT_AMBULATORY_CARE_PROVIDER_SITE_OTHER): Payer: Medicare Other | Admitting: *Deleted

## 2016-07-21 DIAGNOSIS — J309 Allergic rhinitis, unspecified: Secondary | ICD-10-CM

## 2016-07-22 ENCOUNTER — Ambulatory Visit: Payer: Medicare Other | Admitting: Physical Therapy

## 2016-07-22 ENCOUNTER — Encounter: Payer: Self-pay | Admitting: Physical Therapy

## 2016-07-22 DIAGNOSIS — M533 Sacrococcygeal disorders, not elsewhere classified: Secondary | ICD-10-CM | POA: Diagnosis not present

## 2016-07-22 DIAGNOSIS — M545 Low back pain, unspecified: Secondary | ICD-10-CM

## 2016-07-22 NOTE — Therapy (Signed)
[address] [place], [place], [address] Phone: [phone]   Fax:  [phone]  Physical Therapy Treatment  Patient Details  Name: Teresa Morales MRN: [id number] Date of Birth: Feb 22, 1941 Referring Provider: Delilah Shan  Encounter Date: 07/22/2016      PT End of Session - 07/22/16 1236    Visit Number 2   Date for PT Re-Evaluation 09/15/16   PT Start Time 1147      Past Medical History:  Diagnosis Date  . Arthritis    left thumb and lower back,  . Atypical glandular cells of undetermined significance (AGUS) on cervical Pap smear 12/1998   endo polyp- [name]  . Disc degeneration, lumbar 2016   L4, L5, S1  . Endocervical polyp 1997  . Environmental allergies   . GERD (gastroesophageal reflux disease)   . Hypertension   . Interstitial cystitis 2004  . Melanoma of face ([place]) 04/2007  . Mild dysplasia of cervix    laser  . MRSA infection    many yrs ago -right hand(tx. with oral antibiotic)-no further issues  . Osteopenia    intolerant of bisphosphonates  . 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 Urethral stenosis    dilated    Past Surgical History:  Procedure Laterality Date  . COLONOSCOPY WITH PROPOFOL N/A 10/02/2015   Procedure: COLONOSCOPY WITH PROPOFOL;  Surgeon: Garlan Fair, MD;  [facility];  Service: Endoscopy;  Laterality: N/A;  . HAND SURGERY     cyst removed from rt hand  . HERNIA REPAIR  7096, 01/22/35   umbilical mesh placed in August 2015.  Marland Kitchen HERNIA REPAIR  02-05-15   bilateral inguinal - mesh  . MELANOMA EXCISION Right    '08- right cheek- no futher issues  . varicose vein Left 2016   past years bilateral  . WISDOM TOOTH EXTRACTION      There were no vitals filed for this visit.      Subjective Assessment - 07/22/16 1233    Subjective achey, doing ex and have done MET 3 times   Pain Score 4    Pain Location Sacrum   Pain  Orientation Right                         [facility] Adult PT Treatment/Exercise - 07/22/16 0001      Modalities   Modalities Electrical Stimulation;Iontophoresis;Traction;Moist Heat;Ultrasound     Moist Heat Therapy   Number Minutes Moist Heat 15 Minutes   Moist Heat Location Lumbar Spine     Ultrasound   Ultrasound Location rt SI   Ultrasound Parameters 100% 47mz 1.4 w/cm 2   Ultrasound Goals Pain     Iontophoresis   Type of Iontophoresis Dexamethasone   Location RT SI   Dose 1.2 cc   Time 4 hour leave on patch     Traction   Type of Traction Lumbar   Max (lbs) 45   Time 15     Manual Therapy   Manual Therapy Soft tissue mobilization   Manual therapy comments RT SI                  PT Short Term Goals - 07/16/16 0859      PT SHORT TERM GOAL #1   Title understand her HEP   Time 2   Period Weeks   Status New  PT Long Term Goals - 07/16/16 0859      PT LONG TERM GOAL #1   Title Decrease pain 50%   Time 8   Period Weeks   Status New     PT LONG TERM GOAL #2   Title walk without pain   Time 8   Period Weeks   Status New     PT LONG TERM GOAL #3   Title understand how important the exercises are for stability and how to correct an innominate rotation   Time 8   Period Weeks   Status New     PT LONG TERM GOAL #4   Title do yardwork and gardening without significant increase of pain   Time 8   Period Weeks   Status New               Plan - 07/22/16 1237    Clinical Impression Statement pt very tender over RT SI. equal alignment today. Tolerated interventions with good relief.   PT Next Visit Plan slowly start SI stab ex      Patient will benefit from skilled therapeutic intervention in order to improve the following deficits and impairments:  Decreased range of motion, Decreased strength, Difficulty walking, Increased muscle spasms, Increased fascial restricitons, Impaired flexibility, Improper body  mechanics, Pain  Visit Diagnosis: SI (sacroiliac) pain  Right-sided low back pain without sciatica     Problem List Patient Active Problem List   Diagnosis Date Noted  . Gastroesophageal reflux disease without esophagitis 01/07/2016  . Insect sting allergy, current reaction 01/07/2016  . Rhinitis, allergic 08/25/2015  . Recurrent ventral hernia 05/31/2014    Scarlette Hogston,ANGIE PTA 07/22/2016, 12:38 PM  [place] [address] [place], [place], [address] Phone: [phone]   Fax:  [phone]  Name: Teresa Morales MRN: [id number] Date of Birth: [birth year]

## 2016-07-24 ENCOUNTER — Ambulatory Visit: Payer: Medicare Other | Admitting: Physical Therapy

## 2016-07-24 ENCOUNTER — Encounter: Payer: Self-pay | Admitting: Physical Therapy

## 2016-07-24 DIAGNOSIS — M545 Low back pain, unspecified: Secondary | ICD-10-CM

## 2016-07-24 DIAGNOSIS — M533 Sacrococcygeal disorders, not elsewhere classified: Secondary | ICD-10-CM | POA: Diagnosis not present

## 2016-07-24 NOTE — Therapy (Signed)
[address] [place], [place], [address] Phone: [phone]   Fax:  [phone]  Physical Therapy Treatment  Patient Details  Name: Teresa Morales MRN: [id number] Date of Birth: 09-Jul-1941 Referring Provider: Delilah Teresa Morales  Encounter Date: 07/24/2016      PT End of Session - 07/24/16 1154    Visit Number 3   Date for PT Re-Evaluation 09/15/16   PT Start Time 1109   PT Stop Time 1200   PT Time Calculation (min) 51 min      Past Medical History:  Diagnosis Date  . Arthritis    left thumb and lower back,  . Atypical glandular cells of undetermined significance (AGUS) on cervical Pap smear 12/1998   endo polyp- Teresa Morales  . Disc degeneration, lumbar 2016   L4, L5, S1  . Endocervical polyp 1997  . Environmental allergies   . GERD (gastroesophageal reflux Morales)   . Hypertension   . Interstitial cystitis 2004  . Melanoma of face ([place]) 04/2007  . Mild dysplasia of cervix    laser  . MRSA infection    many yrs ago -right hand(tx. with oral antibiotic)-no further issues  . Osteopenia    intolerant of bisphosphonates  . SIADH (syndrome of inappropriate ADH production) ([place]resa Morales) 1998  . Urethral stenosis    dilated    Past Surgical History:  Procedure Laterality Date  . COLONOSCOPY WITH PROPOFOL N/A 10/02/2015   Procedure: COLONOSCOPY WITH PROPOFOL;  Surgeon: Garlan Fair, MD;  [facility];  Service: Endoscopy;  Laterality: N/A;  . HAND SURGERY     cyst removed from rt hand  . HERNIA REPAIR  [id number], [id number]   umbilical mesh placed in August 2015.  Marland Kitchen HERNIA REPAIR  02-05-15   bilateral inguinal - mesh  . MELANOMA EXCISION Right    '08- right cheek- no futher issues  . varicose vein Left 2016   past years bilateral  . WISDOM TOOTH EXTRACTION      There were no vitals filed for this visit.      Subjective Assessment - 07/24/16 1146    Subjective last session helped,just soreness  not pain   Currently in Pain? Yes   Pain Score 2    Pain Location Sacrum   Pain Orientation Right   Pain Descriptors / Indicators Aching                         [facility] Adult PT Treatment/Exercise - 07/24/16 0001      Exercises   Exercises Lumbar;Knee/Hip     Lumbar Exercises: Supine   Clam 20 reps  red tband   Bridge 15 reps;3 seconds  with ball squueze   Bridge Limitations --  marching with red tabdn 20 times   Other Supine Lumbar Exercises bridge with ball, KTC and obl 15 each     Moist Heat Therapy   Number Minutes Moist Heat 15 Minutes   Moist Heat Location Lumbar Spine     Electrical Stimulation   Electrical Stimulation Location right SI area   Electrical Stimulation Action IFC   Electrical Stimulation Parameters supine   Electrical Stimulation Goals Pain     Ultrasound   Ultrasound Location RT SI   Ultrasound Parameters 1 mhz 1.4 w/cm2   Ultrasound Goals Pain     Iontophoresis   Type of Iontophoresis Dexamethasone   Location RT SI   Dose 1.2cc   Time 4  hour leave on patch     Traction   Type of Traction Lumbar   Max (lbs) 45   Time 15     Manual Therapy   Manual therapy comments SI = alignment                  PT Short Term Goals - 07/24/16 1155      PT SHORT TERM GOAL #1   Title understand her HEP   Status Achieved           PT Long Term Goals - 07/16/16 0859      PT LONG TERM GOAL #1   Title Decrease pain 50%   Time 8   Period Weeks   Status New     PT LONG TERM GOAL #2   Title walk without pain   Time 8   Period Weeks   Status New     PT LONG TERM GOAL #3   Title understand how important the exercises are for stability and how to correct an innominate rotation   Time 8   Period Weeks   Status New     PT LONG TERM GOAL #4   Title do yardwork and gardening without significant increase of pain   Time 8   Period Weeks   Status New               Plan - 07/24/16 1154    Clinical Impression  Statement responding well to modalities and tolerated ther ex interventions with mild discomfort and cuing required   PT Next Visit Plan progress SI stab      Patient will benefit from skilled therapeutic intervention in order to improve the following deficits and impairments:  Decreased range of motion, Decreased strength, Difficulty walking, Increased muscle spasms, Increased fascial restricitons, Impaired flexibility, Improper body mechanics, Pain  Visit Diagnosis: SI (sacroiliac) pain  Right-sided low back pain without sciatica     Problem List Patient Active Problem List   Diagnosis Date Noted  . Gastroesophageal reflux Morales without esophagitis 01/07/2016  . Insect sting allergy, current reaction 01/07/2016  . Rhinitis, allergic 08/25/2015  . Recurrent ventral hernia 05/31/2014    Trevel Teresa Morales,ANGIE PTA 07/24/2016, 11:56 AM  [place], [place], [address] Phone: [phone]   Fax:  [phone]  Name: Teresa Morales MRN: [id number] Date of Birth: [birth year]

## 2016-07-25 DIAGNOSIS — T63441D Toxic effect of venom of bees, accidental (unintentional), subsequent encounter: Secondary | ICD-10-CM | POA: Diagnosis not present

## 2016-07-28 ENCOUNTER — Ambulatory Visit (INDEPENDENT_AMBULATORY_CARE_PROVIDER_SITE_OTHER): Payer: Medicare Other | Admitting: *Deleted

## 2016-07-28 DIAGNOSIS — T63441D Toxic effect of venom of bees, accidental (unintentional), subsequent encounter: Secondary | ICD-10-CM

## 2016-07-29 ENCOUNTER — Ambulatory Visit: Payer: Medicare Other | Admitting: Physical Therapy

## 2016-07-29 ENCOUNTER — Encounter: Payer: Self-pay | Admitting: Physical Therapy

## 2016-07-29 DIAGNOSIS — M533 Sacrococcygeal disorders, not elsewhere classified: Secondary | ICD-10-CM | POA: Diagnosis not present

## 2016-07-29 DIAGNOSIS — M545 Low back pain, unspecified: Secondary | ICD-10-CM

## 2016-07-29 NOTE — Therapy (Signed)
[address] [place], [place], [address] Phone: [phone]   Fax:  [phone]  Physical Therapy Treatment  Patient Details  Name: IVY SPRITZER MRN: [id number] Date of Birth: Apr 30, 1941 Referring Provider: Delilah Shan  Encounter Date: 07/29/2016      PT End of Session - 07/29/16 1353    Visit Number 4   Date for PT Re-Evaluation 09/15/16   PT Start Time 1326   PT Stop Time 1420   PT Time Calculation (min) 54 min      Past Medical History:  Diagnosis Date  . Arthritis    left thumb and lower back,  . Atypical glandular cells of undetermined significance (AGUS) on cervical Pap smear 12/1998   endo polyp- [name]  . Disc degeneration, lumbar 2016   L4, L5, S1  . Endocervical polyp 1997  . Environmental allergies   . GERD (gastroesophageal reflux disease)   . Hypertension   . Interstitial cystitis 2004  . Melanoma of face ([place]) 04/2007  . Mild dysplasia of cervix    laser  . MRSA infection    many yrs ago -right hand(tx. with oral antibiotic)-no further issues  . Osteopenia    intolerant of bisphosphonates  . 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 Urethral stenosis    dilated    Past Surgical History:  Procedure Laterality Date  . COLONOSCOPY WITH PROPOFOL N/A 10/02/2015   Procedure: COLONOSCOPY WITH PROPOFOL;  Surgeon: Garlan Fair, MD;  [facility];  Service: Endoscopy;  Laterality: N/A;  . HAND SURGERY     cyst removed from rt hand  . HERNIA REPAIR  [id number], [id number]   umbilical mesh placed in August 2015.  Marland Kitchen HERNIA REPAIR  02-05-15   bilateral inguinal - mesh  . MELANOMA EXCISION Right    '08- right cheek- no futher issues  . varicose vein Left 2016   past years bilateral  . WISDOM TOOTH EXTRACTION      There were no vitals filed for this visit.      Subjective Assessment - 07/29/16 1350    Subjective feeling better, helps to use TENS  unit in the morning   Currently in Pain? Yes   Pain Score 2    Pain Location Back                         [facility] Adult PT Treatment/Exercise - 07/29/16 0001      Lumbar Exercises: Aerobic   Stationary Bike Nustep L 5 6 min     Lumbar Exercises: Standing   Scapular Retraction Strengthening;Both;10 reps;Theraband  3 ways   Theraband Level (Scapular Retraction) Level 2 (Red)   Other Standing Lumbar Exercises red tband hip ext and abd 10 times each     Lumbar Exercises: Seated   Long Arc Quad on Chair Strengthening;Both;1 set;10 reps  red tband + hip abd and marching   Sit to Stand Limitations sit fit pelvic ROM 15 times each     Moist Heat Therapy   Number Minutes Moist Heat 15 Minutes   Moist Heat Location Lumbar Spine     Iontophoresis   Type of Iontophoresis Dexamethasone   Location RT SI   Dose 1.2cc      Traction   Type of Traction Lumbar   Max (lbs) 45   Time 15  PT Short Term Goals - 07/24/16 1155      PT SHORT TERM GOAL #1   Title understand her HEP   Status Achieved           PT Long Term Goals - 07/29/16 1358      PT LONG TERM GOAL #1   Title Decrease pain 50%   Status On-going     PT LONG TERM GOAL #2   Title walk without pain   Status On-going     PT LONG TERM GOAL #3   Title understand how important the exercises are for stability and how to correct an innominate rotation   Status On-going     PT LONG TERM GOAL #4   Title do yardwork and gardening without significant increase of pain   Status On-going               Plan - 07/29/16 1403    Clinical Impression Statement pt responding well to therapy with decreased pain and tolerated add'l ther ex well,some cuing for BM needed with ther ex. progressing goals   PT Next Visit Plan increase HEP      Patient will benefit from skilled therapeutic intervention in order to improve the following deficits and impairments:     Visit  Diagnosis: Right-sided low back pain without sciatica     Problem List Patient Active Problem List   Diagnosis Date Noted  . Gastroesophageal reflux disease without esophagitis 01/07/2016  . Insect sting allergy, current reaction 01/07/2016  . Rhinitis, allergic 08/25/2015  . Recurrent ventral hernia 05/31/2014    Owais Pruett,ANGIE PTA 07/29/2016, 2:04 PM  [place], [place], [address] Phone: [phone]   Fax:  [phone]  Name: AMERIA POSTLETHWAIT MRN: [id number] Date of Birth: [birth year]

## 2016-07-31 ENCOUNTER — Encounter: Payer: Self-pay | Admitting: Physical Therapy

## 2016-07-31 ENCOUNTER — Ambulatory Visit: Payer: Medicare Other | Admitting: Physical Therapy

## 2016-07-31 DIAGNOSIS — M533 Sacrococcygeal disorders, not elsewhere classified: Secondary | ICD-10-CM

## 2016-07-31 DIAGNOSIS — M545 Low back pain, unspecified: Secondary | ICD-10-CM

## 2016-07-31 NOTE — Therapy (Signed)
[address] [place], [place], [address] Phone: [phone]   Fax:  [phone]  Physical Therapy Treatment  Patient Details  Name: Teresa Morales MRN: [id number] Date of Birth: Mar 31, 1941 Referring Provider: Delilah Shan  Encounter Date: 07/31/2016      PT End of Session - 07/31/16 1522    Visit Number 5   Date for PT Re-Evaluation 09/15/16   PT Start Time 1410   PT Stop Time 1505   PT Time Calculation (min) 55 min      Past Medical History:  Diagnosis Date  . Arthritis    left thumb and lower back,  . Atypical glandular cells of undetermined significance (AGUS) on cervical Pap smear 12/1998   endo polyp- [name]  . Disc degeneration, lumbar 2016   L4, L5, S1  . Endocervical polyp 1997  . Environmental allergies   . GERD (gastroesophageal reflux disease)   . Hypertension   . Interstitial cystitis 2004  . Melanoma of face ([place]) 04/2007  . Mild dysplasia of cervix    laser  . MRSA infection    many yrs ago -right hand(tx. with oral antibiotic)-no further issues  . Osteopenia    intolerant of bisphosphonates  . 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 Urethral stenosis    dilated    Past Surgical History:  Procedure Laterality Date  . COLONOSCOPY WITH PROPOFOL N/A 10/02/2015   Procedure: COLONOSCOPY WITH PROPOFOL;  Surgeon: Garlan Fair, MD;  Location: [facility];  Service: Endoscopy;  Laterality: N/A;  . HAND SURGERY     cyst removed from rt hand  . HERNIA REPAIR  7017, 06/22/38   umbilical mesh placed in August 2015.  Marland Kitchen HERNIA REPAIR  02-05-15   bilateral inguinal - mesh  . MELANOMA EXCISION Right    '08- right cheek- no futher issues  . varicose vein Left 2016   past years bilateral  . WISDOM TOOTH EXTRACTION      There were no vitals filed for this visit.      Subjective Assessment - 07/31/16 1518    Subjective overall 50% better   Currently  in Pain? Yes   Pain Score 2    Pain Location Back   Pain Orientation Right   Pain Descriptors / Indicators Aching                         [facility] Adult PT Treatment/Exercise - 07/31/16 0001      Lumbar Exercises: Aerobic   Stationary Bike Nustep L 5 7 min     Lumbar Exercises: Standing   Scapular Retraction Strengthening;Both;10 reps;Theraband   Theraband Level (Scapular Retraction) Level 2 (Red)   Other Standing Lumbar Exercises red tband hip flex,ext and abd 10 times each     Lumbar Exercises: Seated   Sit to Stand Limitations sit fit pelvic ROM 15 times each     Lumbar Exercises: Supine   Clam 20 reps  on sit fit   Other Supine Lumbar Exercises hip flex and abd with red tband on sit fit      Moist Heat Therapy   Number Minutes Moist Heat 15 Minutes   Moist Heat Location Lumbar Spine     Iontophoresis   Type of Iontophoresis Dexamethasone   Location RT SI   Dose 1.2cc     Traction   Type of Traction Lumbar   Max (lbs) 48  Time 15                PT Education - 07/31/16 1522    Education provided Yes   Education Details okay to resume walking level terrain. issued tabdn ex for scap stab and hip 3 way ( red tband)   Person(s) Educated Patient   Methods Explanation;Demonstration   Comprehension Verbalized understanding;Returned demonstration          PT Short Term Goals - 07/24/16 1155      PT SHORT TERM GOAL #1   Title understand her HEP   Status Achieved           PT Long Term Goals - 07/31/16 1523      PT LONG TERM GOAL #1   Title Decrease pain 50%   Status Achieved     PT LONG TERM GOAL #2   Title walk without pain   Status On-going     PT LONG TERM GOAL #3   Title understand how important the exercises are for stability and how to correct an innominate rotation     PT LONG TERM GOAL #5   Status Partially Met               Plan - 07/31/16 1523    Clinical Impression Statement 1 97f3 LTG met,increasing  HEP and okayed to resume walking. Pt progressing well. Equal alignment just soreness for weak ligaments.   PT Next Visit Plan decrease freq to 1 x week as pt is at visit 16 for year      Patient will benefit from skilled therapeutic intervention in order to improve the following deficits and impairments:  Decreased range of motion, Decreased strength, Difficulty walking, Increased muscle spasms, Increased fascial restricitons, Impaired flexibility, Improper body mechanics, Pain  Visit Diagnosis: Right-sided low back pain without sciatica  SI (sacroiliac) pain     Problem List Patient Active Problem List   Diagnosis Date Noted  . Gastroesophageal reflux disease without esophagitis 01/07/2016  . Insect sting allergy, current reaction 01/07/2016  . Rhinitis, allergic 08/25/2015  . Recurrent ventral hernia 05/31/2014    Brynnan Rodenbaugh,ANGIE PTA 07/31/2016, 3:25 PM  [place] [address]Phone: [phone]  Fax:  [phone] Name: Teresa HOESMRN: [id number] of Birth: 1November 23, 1942

## 2016-08-01 ENCOUNTER — Ambulatory Visit (INDEPENDENT_AMBULATORY_CARE_PROVIDER_SITE_OTHER): Payer: Medicare Other

## 2016-08-01 DIAGNOSIS — J309 Allergic rhinitis, unspecified: Secondary | ICD-10-CM | POA: Diagnosis not present

## 2016-08-05 ENCOUNTER — Ambulatory Visit: Payer: Medicare Other | Admitting: Physical Therapy

## 2016-08-05 ENCOUNTER — Other Ambulatory Visit: Payer: Self-pay | Admitting: Allergy and Immunology

## 2016-08-07 ENCOUNTER — Ambulatory Visit: Payer: Medicare Other | Admitting: Physical Therapy

## 2016-08-07 ENCOUNTER — Encounter: Payer: Self-pay | Admitting: Physical Therapy

## 2016-08-07 DIAGNOSIS — M533 Sacrococcygeal disorders, not elsewhere classified: Secondary | ICD-10-CM | POA: Diagnosis not present

## 2016-08-07 DIAGNOSIS — M545 Low back pain, unspecified: Secondary | ICD-10-CM

## 2016-08-07 NOTE — Therapy (Signed)
[address] [place], [place], [address] Phone: [phone]   Fax:  [phone]  Physical Therapy Treatment  Patient Details  Name: Teresa Morales MRN: [id number] Date of Birth: December 10, 1941 Referring Provider: Delilah Shan  Encounter Date: 08/07/2016      PT End of Session - 08/07/16 1205    Visit Number 6   Date for PT Re-Evaluation 09/15/16   PT Start Time 1135   PT Stop Time 1230   PT Time Calculation (min) 55 min      Past Medical History:  Diagnosis Date  . Arthritis    left thumb and lower back,  . Atypical glandular cells of undetermined significance (AGUS) on cervical Pap smear 12/1998   endo polyp- [name]  . Disc degeneration, lumbar 2016   L4, L5, S1  . Endocervical polyp 1997  . Environmental allergies   . GERD (gastroesophageal reflux disease)   . Hypertension   . Interstitial cystitis 2004  . Melanoma of face ([place]) 04/2007  . Mild dysplasia of cervix    laser  . MRSA infection    many yrs ago -right hand(tx. with oral antibiotic)-no further issues  . Osteopenia    intolerant of bisphosphonates  . 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 Urethral stenosis    dilated    Past Surgical History:  Procedure Laterality Date  . COLONOSCOPY WITH PROPOFOL N/A 10/02/2015   Procedure: COLONOSCOPY WITH PROPOFOL;  Surgeon: Garlan Fair, MD;  Location: [facility];  Service: Endoscopy;  Laterality: N/A;  . HAND SURGERY     cyst removed from rt hand  . HERNIA REPAIR  [id number], [id number]   umbilical mesh placed in August 2015.  Marland Kitchen HERNIA REPAIR  02-05-15   bilateral inguinal - mesh  . MELANOMA EXCISION Right    '08- right cheek- no futher issues  . varicose vein Left 2016   past years bilateral  . WISDOM TOOTH EXTRACTION      There were no vitals filed for this visit.      Subjective Assessment - 08/07/16 1151    Subjective ex 1 time per day, 50% better but  want to be better   Currently in Pain? Yes   Pain Score 4    Pain Orientation Right   Pain Descriptors / Indicators Aching                         [facility] Adult PT Treatment/Exercise - 08/07/16 0001      Self-Care   Self-Care ADL's;Lifting;Other Self-Care Comments   Other Self-Care Comments  stressed imp of good mech with activities at home and garden to decrease strain on LB     Lumbar Exercises: Aerobic   Stationary Bike Nustep L 5 7 min     Lumbar Exercises: Machines for Strengthening   Leg Press 30# 3 sets 10   Other Lumbar Machine Exercise 20# 2 sets 10 lat and row   Other Lumbar Machine Exercise pulley trunk rotation 15# 15 times each     Lumbar Exercises: Standing   Other Standing Lumbar Exercises 5# lat trunk flex 15 each side   Other Standing Lumbar Exercises 5# UE PNF 10 each way     Moist Heat Therapy   Number Minutes Moist Heat 15 Minutes   Moist Heat Location Lumbar Spine     Iontophoresis   Type of Iontophoresis Dexamethasone   Location RT  SI   Dose 1.2cc   Time 4 hour leave on patch     Traction   Type of Traction Lumbar   Max (lbs) 48   Time 15                PT Education - 08/07/16 1153    Education provided Yes   Education Details answered ex questions for HEP for better understanding for pt   Person(s) Educated Patient   Methods Explanation   Comprehension Verbalized understanding          PT Short Term Goals - 07/24/16 1155      PT SHORT TERM GOAL #1   Title understand her HEP   Status Achieved           PT Long Term Goals - 08/07/16 1204      PT LONG TERM GOAL #1   Title Decrease pain 50%   Status Achieved     PT LONG TERM GOAL #2   Title walk without pain   Status On-going     PT LONG TERM GOAL #3   Title understand how important the exercises are for stability and how to correct an innominate rotation   Status On-going     PT LONG TERM GOAL #4   Title do yardwork and gardening without  significant increase of pain   Status On-going               Plan - 08/07/16 1217    Clinical Impression Statement pt making progress and able to stay in alignment but soreness and core /lumb weakness noted. educated today on ADLs and lifting ot avoid soreness   PT Next Visit Plan decrease freq to 1 x week as pt is at visit 34 for year. Core strengthening, modalities as needed      Patient will benefit from skilled therapeutic intervention in order to improve the following deficits and impairments:  Decreased range of motion, Decreased strength, Difficulty walking, Increased muscle spasms, Increased fascial restricitons, Impaired flexibility, Improper body mechanics, Pain  Visit Diagnosis: Right-sided low back pain without sciatica  SI (sacroiliac) pain     Problem List Patient Active Problem List   Diagnosis Date Noted  . Gastroesophageal reflux disease without esophagitis 01/07/2016  . Insect sting allergy, current reaction 01/07/2016  . Rhinitis, allergic 08/25/2015  . Recurrent ventral hernia 05/31/2014    Lily Velasquez,ANGIE PTA 08/07/2016, 12:20 PM  [place], [place], [address] Phone: [phone]   Fax:  [phone]  Name: JAVONTE ACHTERBERG MRN: [id number] Date of Birth: 25-Mar-1941

## 2016-08-08 ENCOUNTER — Ambulatory Visit (INDEPENDENT_AMBULATORY_CARE_PROVIDER_SITE_OTHER): Payer: Medicare Other

## 2016-08-08 DIAGNOSIS — J309 Allergic rhinitis, unspecified: Secondary | ICD-10-CM | POA: Diagnosis not present

## 2016-08-11 ENCOUNTER — Other Ambulatory Visit: Payer: Self-pay | Admitting: Allergy and Immunology

## 2016-08-14 ENCOUNTER — Ambulatory Visit: Payer: Medicare Other | Admitting: Physical Therapy

## 2016-08-14 ENCOUNTER — Encounter: Payer: Self-pay | Admitting: Physical Therapy

## 2016-08-14 DIAGNOSIS — M545 Low back pain, unspecified: Secondary | ICD-10-CM

## 2016-08-14 DIAGNOSIS — M533 Sacrococcygeal disorders, not elsewhere classified: Secondary | ICD-10-CM

## 2016-08-14 NOTE — Therapy (Signed)
[address] [place], [place], [address] Phone: [phone]   Fax:  [phone]  Physical Therapy Treatment  Patient Details  Name: Teresa Morales MRN: [id number] Date of Birth: 02-12-1941 Referring Provider: Delilah Shan  Encounter Date: 08/14/2016      PT End of Session - 08/14/16 0956    Visit Number 7   Date for PT Re-Evaluation 09/15/16   PT Start Time 0938   PT Stop Time 1040   PT Time Calculation (min) 62 min      Past Medical History:  Diagnosis Date  . Arthritis    left thumb and lower back,  . Atypical glandular cells of undetermined significance (AGUS) on cervical Pap smear 12/1998   endo polyp- [name]  . Disc degeneration, lumbar 2016   L4, L5, S1  . Endocervical polyp 1997  . Environmental allergies   . GERD (gastroesophageal reflux disease)   . Hypertension   . Interstitial cystitis 2004  . Melanoma of face ([place]) 04/2007  . Mild dysplasia of cervix    laser  . MRSA infection    many yrs ago -right hand(tx. with oral antibiotic)-no further issues  . Osteopenia    intolerant of bisphosphonates  . 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 Urethral stenosis    dilated    Past Surgical History:  Procedure Laterality Date  . COLONOSCOPY WITH PROPOFOL N/A 10/02/2015   Procedure: COLONOSCOPY WITH PROPOFOL;  Surgeon: Garlan Fair, MD;  [facility];  Service: Endoscopy;  Laterality: N/A;  . HAND SURGERY     cyst removed from rt hand  . HERNIA REPAIR  [id number], [id number]   umbilical mesh placed in August 2015.  Marland Kitchen HERNIA REPAIR  02-05-15   bilateral inguinal - mesh  . MELANOMA EXCISION Right    '08- right cheek- no futher issues  . varicose vein Left 2016   past years bilateral  . WISDOM TOOTH EXTRACTION      There were no vitals filed for this visit.      Subjective Assessment - 08/14/16 0938    Subjective felt great 3 days after last  session, ex at home , using estim and walking some- going to get new shoes   Currently in Pain? Yes   Pain Score 6    Pain Location Back   Pain Orientation Right   Pain Descriptors / Indicators Aching                         [facility] Adult PT Treatment/Exercise - 08/14/16 0001      Lumbar Exercises: Aerobic   Stationary Bike Nustep L 5 6 min     Lumbar Exercises: Machines for Strengthening   Leg Press 30# 3 sets 10   Other Lumbar Machine Exercise lat pull and seated row 20# 2 sets 10     Knee/Hip Exercises: Standing   Lateral Step Up Both;10 reps;Hand Hold: 2;Step Height: 6"  opp leg abd   Forward Step Up Both;10 reps;Hand Hold: 2;Step Height: 6"  opp leg ext     Moist Heat Therapy   Number Minutes Moist Heat 15 Minutes   Moist Heat Location Lumbar Spine     Iontophoresis   Type of Iontophoresis Dexamethasone   Location RT SI   Dose 1.2cc   Time 4 hour patch     Traction   Type of Traction Lumbar   Max (  lbs) 50   Time 15                  PT Short Term Goals - 07/24/16 1155      PT SHORT TERM GOAL #1   Title understand her HEP   Status Achieved           PT Long Term Goals - 08/14/16 0957      PT LONG TERM GOAL #1   Title Decrease pain 50%   Status Achieved     PT LONG TERM GOAL #2   Title walk without pain   Status On-going     PT LONG TERM GOAL #3   Title understand how important the exercises are for stability and how to correct an innominate rotation   Status Achieved     PT LONG TERM GOAL #4   Title do yardwork and gardening without significant increase of pain   Status On-going               Plan - 08/14/16 0958    Clinical Impression Statement pt with a high pain ratingthis morning without using estim. pt verb fearful of yard work and overdoing d/t back pain. pt verb doing HEP daily but still needs estim amd heat daily . Discussed with pt possible consult for epidural injection.   PT Next Visit Plan decrease  freq to 1 x week as pt is at visit 29 for year. Core strengthening, modalities as needed      Patient will benefit from skilled therapeutic intervention in order to improve the following deficits and impairments:  Decreased range of motion, Decreased strength, Difficulty walking, Increased muscle spasms, Increased fascial restricitons, Impaired flexibility, Improper body mechanics, Pain  Visit Diagnosis: Right-sided low back pain without sciatica  SI (sacroiliac) pain     Problem List Patient Active Problem List   Diagnosis Date Noted  . Gastroesophageal reflux disease without esophagitis 01/07/2016  . Insect sting allergy, current reaction 01/07/2016  . Rhinitis, allergic 08/25/2015  . Recurrent ventral hernia 05/31/2014    PAYSEUR,ANGIE PTA 08/14/2016, 10:00 AM  [place], [place], [address] Phone: [phone]   Fax:  [phone]  Name: Teresa Morales MRN: [id number] Date of Birth: 1941/07/01

## 2016-08-15 ENCOUNTER — Ambulatory Visit (INDEPENDENT_AMBULATORY_CARE_PROVIDER_SITE_OTHER): Payer: Medicare Other

## 2016-08-15 DIAGNOSIS — J309 Allergic rhinitis, unspecified: Secondary | ICD-10-CM

## 2016-08-21 ENCOUNTER — Encounter: Payer: Self-pay | Admitting: Physical Therapy

## 2016-08-21 ENCOUNTER — Ambulatory Visit: Payer: Medicare Other | Attending: Sports Medicine | Admitting: Physical Therapy

## 2016-08-21 DIAGNOSIS — M545 Low back pain, unspecified: Secondary | ICD-10-CM

## 2016-08-21 DIAGNOSIS — M533 Sacrococcygeal disorders, not elsewhere classified: Secondary | ICD-10-CM | POA: Diagnosis not present

## 2016-08-21 NOTE — Therapy (Signed)
[address] [place], [place], [address] Phone: [phone]   Fax:  [phone]  Physical Therapy Treatment  Patient Details  Name: Teresa Morales MRN: [id number] Date of Birth: January 05, 1941 Referring Provider: Delilah Shan  Encounter Date: 08/21/2016      PT End of Session - 08/21/16 1136    Visit Number 8   Date for PT Re-Evaluation 09/15/16   PT Start Time 1110   PT Stop Time 1210   PT Time Calculation (min) 60 min      Past Medical History:  Diagnosis Date  . Arthritis    left thumb and lower back,  . Atypical glandular cells of undetermined significance (AGUS) on cervical Pap smear 12/1998   endo polyp- [name]  . Disc degeneration, lumbar 2016   L4, L5, S1  . Endocervical polyp 1997  . Environmental allergies   . GERD (gastroesophageal reflux disease)   . Hypertension   . Interstitial cystitis 2004  . Melanoma of face ([place]) 04/2007  . Mild dysplasia of cervix    laser  . MRSA infection    many yrs ago -right hand(tx. with oral antibiotic)-no further issues  . Osteopenia    intolerant of bisphosphonates  . 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 Urethral stenosis    dilated    Past Surgical History:  Procedure Laterality Date  . COLONOSCOPY WITH PROPOFOL N/A 10/02/2015   Procedure: COLONOSCOPY WITH PROPOFOL;  Surgeon: Garlan Fair, MD;  [facility]: [facility];  Service: Endoscopy;  Laterality: N/A;  . HAND SURGERY     cyst removed from rt hand  . HERNIA REPAIR  [id number], [id number]   umbilical mesh placed in August 2015.  Marland Kitchen HERNIA REPAIR  02-05-15   bilateral inguinal - mesh  . MELANOMA EXCISION Right    '08- right cheek- no futher issues  . varicose vein Left 2016   past years bilateral  . WISDOM TOOTH EXTRACTION      There were no vitals filed for this visit.      Subjective Assessment - 08/21/16 1114    Subjective better, think I tunred a corner.  Overall 75% better   Currently in Pain? Yes   Pain Score 4    Pain Location Back   Pain Orientation Right;Left                         [facility] Adult PT Treatment/Exercise - 08/21/16 0001      Lumbar Exercises: Aerobic   Stationary Bike Nustep L 5 6 min     Lumbar Exercises: Machines for Strengthening   Cybex Lumbar Extension blue tband 2 sets 10  seated lumb flexion blue tband 2 sets 10   Other Lumbar Machine Exercise lat pull and seated row 20# 2 sets 15     Moist Heat Therapy   Number Minutes Moist Heat 15 Minutes   Moist Heat Location Lumbar Spine     Iontophoresis   Type of Iontophoresis Dexamethasone   Location RT SI   Dose 1.2cc   Time 4 hour patch     Traction   Type of Traction Lumbar   Max (lbs) 50   Time 15                PT Education - 08/21/16 1135    Education provided Yes   Education Details 2 week hold for HEP and walking programs. in  2 weeks f/u for gym instruction then D/C   Person(s) Educated Patient   Methods Explanation;Demonstration   Comprehension Verbalized understanding          PT Short Term Goals - 07/24/16 1155      PT SHORT TERM GOAL #1   Title understand her HEP   Status Achieved           PT Long Term Goals - 08/21/16 1136      PT LONG TERM GOAL #1   Title Decrease pain 50%   Status Achieved     PT LONG TERM GOAL #2   Title walk without pain     PT LONG TERM GOAL #3   Title understand how important the exercises are for stability and how to correct an innominate rotation   Status Achieved     PT LONG TERM GOAL #4   Title do yardwork and gardening without significant increase of pain   Status On-going               Plan - 08/21/16 1137    Clinical Impression Statement progressing with goals and increased func and activities resuming   PT Next Visit Plan 2 week hold for HEP and walking programs. in 2 weeks f/u for gym instruction then D/C      Patient will benefit from skilled  therapeutic intervention in order to improve the following deficits and impairments:  Decreased range of motion, Decreased strength, Difficulty walking, Increased muscle spasms, Increased fascial restricitons, Impaired flexibility, Improper body mechanics, Pain  Visit Diagnosis: Right-sided low back pain without sciatica  SI (sacroiliac) pain     Problem List Patient Active Problem List   Diagnosis Date Noted  . Gastroesophageal reflux disease without esophagitis 01/07/2016  . Insect sting allergy, current reaction 01/07/2016  . Rhinitis, allergic 08/25/2015  . Recurrent ventral hernia 05/31/2014    PAYSEUR,ANGIE PTA 08/21/2016, 11:43 AM  [place], [place], [address] Phone: [phone]   Fax:  [phone]  Name: Teresa Morales MRN: [id number] Date of Birth: 06-09-1941

## 2016-08-25 ENCOUNTER — Ambulatory Visit (INDEPENDENT_AMBULATORY_CARE_PROVIDER_SITE_OTHER): Payer: Medicare Other | Admitting: *Deleted

## 2016-08-25 DIAGNOSIS — J309 Allergic rhinitis, unspecified: Secondary | ICD-10-CM | POA: Diagnosis not present

## 2016-09-02 DIAGNOSIS — T63441D Toxic effect of venom of bees, accidental (unintentional), subsequent encounter: Secondary | ICD-10-CM | POA: Diagnosis not present

## 2016-09-03 ENCOUNTER — Ambulatory Visit (INDEPENDENT_AMBULATORY_CARE_PROVIDER_SITE_OTHER): Payer: Medicare Other

## 2016-09-03 DIAGNOSIS — T63441D Toxic effect of venom of bees, accidental (unintentional), subsequent encounter: Secondary | ICD-10-CM | POA: Diagnosis not present

## 2016-09-04 ENCOUNTER — Ambulatory Visit: Payer: Medicare Other | Admitting: Physical Therapy

## 2016-09-04 ENCOUNTER — Encounter: Payer: Self-pay | Admitting: Physical Therapy

## 2016-09-04 DIAGNOSIS — M545 Low back pain, unspecified: Secondary | ICD-10-CM

## 2016-09-04 DIAGNOSIS — M533 Sacrococcygeal disorders, not elsewhere classified: Secondary | ICD-10-CM

## 2016-09-04 NOTE — Therapy (Signed)
[address] [place], [place], [address] Phone: [phone]   Fax:  [phone]  Physical Therapy Treatment  Patient Details  Name: Teresa Morales MRN: [id number] Date of Birth: [birth year] Referring Provider: Delilah Shan  Encounter Date: 09/04/2016      PT End of Session - 09/04/16 1132    Visit Number 9   Date for PT Re-Evaluation 09/15/16   PT Start Time 1108   PT Stop Time 1200   PT Time Calculation (min) 52 min      Past Medical History:  Diagnosis Date  . Arthritis    left thumb and lower back,  . Atypical glandular cells of undetermined significance (AGUS) on cervical Pap smear 12/1998   endo polyp- [name]  . Disc degeneration, lumbar 2016   L4, L5, S1  . Endocervical polyp 1997  . Environmental allergies   . GERD (gastroesophageal reflux disease)   . Hypertension   . Interstitial cystitis 2004  . Melanoma of face ([place]) 04/2007  . Mild dysplasia of cervix    laser  . MRSA infection    many yrs ago -right hand(tx. with oral antibiotic)-no further issues  . Osteopenia    intolerant of bisphosphonates  . 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 Urethral stenosis    dilated    Past Surgical History:  Procedure Laterality Date  . COLONOSCOPY WITH PROPOFOL N/A 10/02/2015   Procedure: COLONOSCOPY WITH PROPOFOL;  Surgeon: Garlan Fair, MD;  [facility];  Service: Endoscopy;  Laterality: N/A;  . HAND SURGERY     cyst removed from rt hand  . HERNIA REPAIR  8675, 03/19/91   umbilical mesh placed in August 2015.  Marland Kitchen HERNIA REPAIR  02-05-15   bilateral inguinal - mesh  . MELANOMA EXCISION Right    '08- right cheek- no futher issues  . varicose vein Left 2016   past years bilateral  . WISDOM TOOTH EXTRACTION      There were no vitals filed for this visit.      Subjective Assessment - 09/04/16 1130    Subjective i think I am ready to graduate-  just a few questions re: HEP and return to [facility]   Currently in Pain? Yes   Pain Score 2    Pain Location Back   Pain Orientation Right                         [facility] Adult PT Treatment/Exercise - 09/04/16 0001      Self-Care   Self-Care ADL's;Lifting     Lumbar Exercises: Aerobic   Stationary Bike Nustep L 5 6 min     Lumbar Exercises: Machines for Strengthening   Cybex Lumbar Extension 20# 2 sets10   Other Lumbar Machine Exercise lat pull and seated row 20# 2 sets 15     Lumbar Exercises: Standing   Other Standing Lumbar Exercises 20# trunk ext 2 sets 10     Moist Heat Therapy   Number Minutes Moist Heat 15 Minutes   Moist Heat Location Lumbar Spine     Traction   Type of Traction Lumbar   Max (lbs) 50   Time 15                PT Education - 09/04/16 1131    Education provided Yes   Education Details reviewed HEP  and Y safety and progression.  Person(s) Educated Patient   Methods Explanation;Demonstration   Comprehension Verbalized understanding;Returned demonstration          PT Short Term Goals - 07/24/16 1155      PT SHORT TERM GOAL #1   Title understand her HEP   Status Achieved           PT Long Term Goals - 09/04/16 1132      PT LONG TERM GOAL #1   Title Decrease pain 50%   Status Achieved     PT LONG TERM GOAL #2   Title walk without pain   Status Achieved     PT LONG TERM GOAL #3   Title understand how important the exercises are for stability and how to correct an innominate rotation   Status Achieved     PT LONG TERM GOAL #4   Title do yardwork and gardening without significant increase of pain   Status Partially Met               Plan - 09/04/16 1132    Clinical Impression Statement goals met except yard work is still and issue. pt verb understanding of HEP progression and return to [facility] safely.   PT Next Visit Plan D/C      Patient will benefit from skilled therapeutic intervention in order to  improve the following deficits and impairments:  Decreased range of motion, Decreased strength, Difficulty walking, Increased muscle spasms, Increased fascial restricitons, Impaired flexibility, Improper body mechanics, Pain  Visit Diagnosis: Right-sided low back pain without sciatica  SI (sacroiliac) pain     Problem List Patient Active Problem List   Diagnosis Date Noted  . Gastroesophageal reflux disease without esophagitis 01/07/2016  . Insect sting allergy, current reaction 01/07/2016  . Rhinitis, allergic 08/25/2015  . Recurrent ventral hernia 05/31/2014    Eula Mazzola,ANGIE PTA 09/04/2016, 11:53 AM  [place], [place], [address] Phone: [phone]   Fax:  [phone]  Name: Teresa Morales MRN: [id number] Date of Birth: [birth year]

## 2016-09-08 ENCOUNTER — Ambulatory Visit (INDEPENDENT_AMBULATORY_CARE_PROVIDER_SITE_OTHER): Payer: Medicare Other | Admitting: *Deleted

## 2016-09-08 DIAGNOSIS — J309 Allergic rhinitis, unspecified: Secondary | ICD-10-CM

## 2016-09-09 DIAGNOSIS — I83813 Varicose veins of bilateral lower extremities with pain: Secondary | ICD-10-CM | POA: Diagnosis not present

## 2016-09-09 DIAGNOSIS — I83893 Varicose veins of bilateral lower extremities with other complications: Secondary | ICD-10-CM | POA: Diagnosis not present

## 2016-09-10 DIAGNOSIS — I83893 Varicose veins of bilateral lower extremities with other complications: Secondary | ICD-10-CM | POA: Diagnosis not present

## 2016-09-10 DIAGNOSIS — I83813 Varicose veins of bilateral lower extremities with pain: Secondary | ICD-10-CM | POA: Diagnosis not present

## 2016-09-15 ENCOUNTER — Ambulatory Visit (INDEPENDENT_AMBULATORY_CARE_PROVIDER_SITE_OTHER): Payer: Medicare Other

## 2016-09-15 DIAGNOSIS — J309 Allergic rhinitis, unspecified: Secondary | ICD-10-CM | POA: Diagnosis not present

## 2016-09-18 DIAGNOSIS — I83893 Varicose veins of bilateral lower extremities with other complications: Secondary | ICD-10-CM | POA: Diagnosis not present

## 2016-09-18 DIAGNOSIS — I83892 Varicose veins of left lower extremities with other complications: Secondary | ICD-10-CM | POA: Diagnosis not present

## 2016-09-18 DIAGNOSIS — I8312 Varicose veins of left lower extremity with inflammation: Secondary | ICD-10-CM | POA: Diagnosis not present

## 2016-09-18 DIAGNOSIS — I83813 Varicose veins of bilateral lower extremities with pain: Secondary | ICD-10-CM | POA: Diagnosis not present

## 2016-09-23 ENCOUNTER — Ambulatory Visit (INDEPENDENT_AMBULATORY_CARE_PROVIDER_SITE_OTHER): Payer: Medicare Other | Admitting: *Deleted

## 2016-09-23 DIAGNOSIS — J309 Allergic rhinitis, unspecified: Secondary | ICD-10-CM

## 2016-09-23 DIAGNOSIS — I8312 Varicose veins of left lower extremity with inflammation: Secondary | ICD-10-CM | POA: Diagnosis not present

## 2016-09-23 DIAGNOSIS — I83813 Varicose veins of bilateral lower extremities with pain: Secondary | ICD-10-CM | POA: Diagnosis not present

## 2016-09-23 DIAGNOSIS — I83892 Varicose veins of left lower extremities with other complications: Secondary | ICD-10-CM | POA: Diagnosis not present

## 2016-09-23 DIAGNOSIS — I83893 Varicose veins of bilateral lower extremities with other complications: Secondary | ICD-10-CM | POA: Diagnosis not present

## 2016-09-26 DIAGNOSIS — Z23 Encounter for immunization: Secondary | ICD-10-CM | POA: Diagnosis not present

## 2016-09-30 ENCOUNTER — Ambulatory Visit (INDEPENDENT_AMBULATORY_CARE_PROVIDER_SITE_OTHER): Payer: Medicare Other | Admitting: *Deleted

## 2016-09-30 DIAGNOSIS — J309 Allergic rhinitis, unspecified: Secondary | ICD-10-CM

## 2016-10-02 ENCOUNTER — Ambulatory Visit (INDEPENDENT_AMBULATORY_CARE_PROVIDER_SITE_OTHER): Payer: Medicare Other | Admitting: *Deleted

## 2016-10-02 DIAGNOSIS — T63441D Toxic effect of venom of bees, accidental (unintentional), subsequent encounter: Secondary | ICD-10-CM | POA: Diagnosis not present

## 2016-10-21 ENCOUNTER — Ambulatory Visit (INDEPENDENT_AMBULATORY_CARE_PROVIDER_SITE_OTHER): Payer: Medicare Other

## 2016-10-21 DIAGNOSIS — J309 Allergic rhinitis, unspecified: Secondary | ICD-10-CM | POA: Diagnosis not present

## 2016-10-23 DIAGNOSIS — B351 Tinea unguium: Secondary | ICD-10-CM | POA: Diagnosis not present

## 2016-10-23 DIAGNOSIS — L814 Other melanin hyperpigmentation: Secondary | ICD-10-CM | POA: Diagnosis not present

## 2016-10-23 DIAGNOSIS — D485 Neoplasm of uncertain behavior of skin: Secondary | ICD-10-CM | POA: Diagnosis not present

## 2016-10-23 DIAGNOSIS — C44519 Basal cell carcinoma of skin of other part of trunk: Secondary | ICD-10-CM | POA: Diagnosis not present

## 2016-10-23 DIAGNOSIS — L918 Other hypertrophic disorders of the skin: Secondary | ICD-10-CM | POA: Diagnosis not present

## 2016-10-23 DIAGNOSIS — Z85820 Personal history of malignant melanoma of skin: Secondary | ICD-10-CM | POA: Diagnosis not present

## 2016-10-23 DIAGNOSIS — D1801 Hemangioma of skin and subcutaneous tissue: Secondary | ICD-10-CM | POA: Diagnosis not present

## 2016-10-23 DIAGNOSIS — Z85828 Personal history of other malignant neoplasm of skin: Secondary | ICD-10-CM | POA: Diagnosis not present

## 2016-10-23 DIAGNOSIS — L821 Other seborrheic keratosis: Secondary | ICD-10-CM | POA: Diagnosis not present

## 2016-10-29 ENCOUNTER — Ambulatory Visit (INDEPENDENT_AMBULATORY_CARE_PROVIDER_SITE_OTHER): Payer: Medicare Other | Admitting: *Deleted

## 2016-10-29 DIAGNOSIS — J309 Allergic rhinitis, unspecified: Secondary | ICD-10-CM | POA: Diagnosis not present

## 2016-11-03 ENCOUNTER — Ambulatory Visit (INDEPENDENT_AMBULATORY_CARE_PROVIDER_SITE_OTHER): Payer: Medicare Other | Admitting: *Deleted

## 2016-11-03 DIAGNOSIS — T63441D Toxic effect of venom of bees, accidental (unintentional), subsequent encounter: Secondary | ICD-10-CM

## 2016-11-04 DIAGNOSIS — M1812 Unilateral primary osteoarthritis of first carpometacarpal joint, left hand: Secondary | ICD-10-CM | POA: Diagnosis not present

## 2016-11-10 ENCOUNTER — Ambulatory Visit (INDEPENDENT_AMBULATORY_CARE_PROVIDER_SITE_OTHER): Payer: Medicare Other | Admitting: *Deleted

## 2016-11-10 DIAGNOSIS — J309 Allergic rhinitis, unspecified: Secondary | ICD-10-CM

## 2016-11-12 DIAGNOSIS — J3089 Other allergic rhinitis: Secondary | ICD-10-CM | POA: Diagnosis not present

## 2016-11-21 ENCOUNTER — Ambulatory Visit (INDEPENDENT_AMBULATORY_CARE_PROVIDER_SITE_OTHER): Payer: Medicare Other

## 2016-11-21 DIAGNOSIS — J309 Allergic rhinitis, unspecified: Secondary | ICD-10-CM | POA: Diagnosis not present

## 2016-11-28 ENCOUNTER — Ambulatory Visit (INDEPENDENT_AMBULATORY_CARE_PROVIDER_SITE_OTHER): Payer: Medicare Other | Admitting: *Deleted

## 2016-11-28 DIAGNOSIS — J309 Allergic rhinitis, unspecified: Secondary | ICD-10-CM

## 2016-12-03 ENCOUNTER — Ambulatory Visit: Payer: Medicare Other | Attending: Sports Medicine | Admitting: Physical Therapy

## 2016-12-03 ENCOUNTER — Encounter: Payer: Self-pay | Admitting: Physical Therapy

## 2016-12-03 DIAGNOSIS — M533 Sacrococcygeal disorders, not elsewhere classified: Secondary | ICD-10-CM

## 2016-12-03 DIAGNOSIS — G8929 Other chronic pain: Secondary | ICD-10-CM | POA: Insufficient documentation

## 2016-12-03 DIAGNOSIS — R262 Difficulty in walking, not elsewhere classified: Secondary | ICD-10-CM | POA: Insufficient documentation

## 2016-12-03 DIAGNOSIS — M545 Low back pain: Secondary | ICD-10-CM | POA: Diagnosis not present

## 2016-12-03 DIAGNOSIS — M62830 Muscle spasm of back: Secondary | ICD-10-CM | POA: Diagnosis not present

## 2016-12-03 NOTE — Therapy (Signed)
[address] [place], [place], [address] Phone: [phone]   Fax:  [phone]  Physical Therapy Evaluation  Patient Details  Name: Teresa Morales MRN: [id number] Date of Birth: 11/28/1941 Referring Provider: Delilah Shan  Encounter Date: 12/03/2016      PT End of Session - 12/03/16 1614    Visit Number 1   Date for PT Re-Evaluation 12/31/16   PT Start Time 0330   PT Stop Time 0420   PT Time Calculation (min) 50 min   Activity Tolerance Patient tolerated treatment well   Behavior During Therapy [facility] for tasks assessed/performed      Past Medical History:  Diagnosis Date  . Arthritis    left thumb and lower back,  . Atypical glandular cells of undetermined significance (AGUS) on cervical Pap smear 12/1998   endo polyp- [name]  . Disc degeneration, lumbar 2016   L4, L5, S1  . Endocervical polyp 1997  . Environmental allergies   . GERD (gastroesophageal reflux disease)   . Hypertension   . Interstitial cystitis 2004  . Melanoma of face ([place]) 04/2007  . Mild dysplasia of cervix    laser  . MRSA infection    many yrs ago -right hand(tx. with oral antibiotic)-no further issues  . Osteopenia    intolerant of bisphosphonates  . 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 Urethral stenosis    dilated    Past Surgical History:  Procedure Laterality Date  . COLONOSCOPY WITH PROPOFOL N/A 10/02/2015   Procedure: COLONOSCOPY WITH PROPOFOL;  Surgeon: Garlan Fair, MD;  Location: [facility];  Service: Endoscopy;  Laterality: N/A;  . HAND SURGERY     cyst removed from rt hand  . HERNIA REPAIR  [id number], [id number]   umbilical mesh placed in August 2015.  Marland Kitchen HERNIA REPAIR  02-05-15   bilateral inguinal - mesh  . MELANOMA EXCISION Right    '08- right cheek- no futher issues  . varicose vein Left 2016   past years bilateral  . WISDOM TOOTH EXTRACTION      There were no vitals  filed for this visit.       Subjective Assessment - 12/03/16 1528    Subjective Pt states she has a history of SI jt pain, low back pain and arthritic joints. 2 weeks ago, she bent over to pick an item off the floor and she felt that the back began to hurt worse and she could tell "something wasn't right".  Despite receiving an HEP back in [place] for these issues, pt reports that she has not done them "as religious" as she should.   Patient Stated Goals decrease or get rid of pain with everyday activities, get back to the [facility] in order to walk track and perform aerobic exercises   Currently in Pain? Yes   Pain Score 5    Pain Location Sacrum   Pain Orientation Right   Pain Descriptors / Indicators Dull;Constant   Pain Type Chronic pain   Pain Onset Other (comment)  2-3 years   Pain Frequency Constant   Aggravating Factors  trunk flexion with knees straightened, doing household activities the wrong way   Pain Relieving Factors moist heat, traction, TENS unit, exercise            [facility] PT Assessment - 12/03/16 0001      Assessment   Medical Diagnosis low back pain, SI jt pain   Referring Provider  Kendall   Prior Therapy yes     Precautions   Precautions None     Balance Screen   Has the patient fallen in the past 6 months No   Has the patient had a decrease in activity level because of a fear of falling?  No   Is the patient reluctant to leave their home because of a fear of falling?  No     Home Ecologist residence   Living Arrangements Spouse/significant other   Type of [place] to enter   Entrance Stairs-Number of Steps 2     Prior Function   Level of Independence Independent   Vocation Retired   Leisure walking, gardening,      ROM / Strength   AROM / PROM / Strength AROM;Strength     AROM   Overall AROM Comments All pain localized to right posterior hip   AROM Assessment Site Lumbar   Lumbar Flexion  WFL, "no sharp pain"   Lumbar Extension 25% limited "Dull pain" on right posterior hip   Lumbar - Right Side Bend WFL, painful   Lumbar - Left Side Bend WFL, more painful than right SB   Lumbar - Right Rotation WFL, painful   Lumbar - Left Rotation WFL, less painful than right rotation     Strength   Strength Assessment Site Hip;Knee   Right/Left Hip Right;Left   Right Hip Flexion 4-/5   Right Hip ABduction 5/5   Right Hip ADduction 5/5   Left Hip Flexion 4/5   Left Hip ABduction 5/5   Left Hip ADduction 5/5   Right/Left Knee Right;Left   Right Knee Flexion 5/5   Right Knee Extension 5/5   Left Knee Flexion 5/5   Left Knee Extension 5/5     Special Tests    Special Tests Sacrolliac Tests   Sacroiliac Tests  Sacral Thrust     Pelvic Compression   Findings Positive   Side Right     Sacral thrust    Findings Positive   Side Right   Comments FABER test (+), Thigh thrust (+)                   [facility] Adult PT Treatment/Exercise - 12/03/16 0001      Modalities   Modalities Electrical Stimulation;Moist Heat     Moist Heat Therapy   Number Minutes Moist Heat 15 Minutes   Moist Heat Location Lumbar Spine     Electrical Stimulation   Electrical Stimulation Location Right posterio hip/low back   Electrical Stimulation Action IFC   Electrical Stimulation Parameters supine   Electrical Stimulation Goals Pain     Manual Therapy   Manual Therapy Muscle Energy Technique   Muscle Energy Technique Hip extension muscle energy technique                PT Education - 12/03/16 1614    Education provided No          PT Short Term Goals - 12/03/16 1629      PT SHORT TERM GOAL #1   Title Pt will report daily use of HEP.   Time 2   Period Weeks   Status New           PT Long Term Goals - 12/03/16 1630      PT LONG TERM GOAL #1   Title Pt will report 50% decrease in pain.   Time 8  Period Weeks   Status New     PT LONG TERM GOAL #2   Title  Pt will understand proper squat mechanics without compensatory patterns.   Time 8   Period Weeks   Status New     PT LONG TERM GOAL #3   Title Pt will increase bilateral hip strength to 5/5.    Time 8   Period Weeks   Status New               Plan - 12/03/16 1617    Clinical Impression Statement Pt TTP over the right sacral region and right low back and lump is felt . Pt has decreased strength with right and left hip flexion however all other strength tests were strong. Pt was positive for thigh thrust, FABER, pelvic distraction and sacral thrust tests which are indicative of SI jt dysfunction. Muscle energy technique was done on the right leg for hip extension. After one set, pt stood up and walked around. She states that she did feel some difference and "a little better".  Pt body mechanics with squatting activities will be emphasized througout treatment due to pt report of not using right mechanics.   Rehab Potential Good   PT Frequency 2x / week   PT Duration 8 weeks   PT Treatment/Interventions ADLs/Self Care Home Management;Cryotherapy;Electrical Stimulation;Iontophoresis 4mg /ml Dexamethasone;Moist Heat;Traction;Ultrasound;Therapeutic activities;Therapeutic exercise;Neuromuscular re-education;Patient/family education;Manual techniques   PT Next Visit Plan PROM, muscle energy technique, body mechanics, hip flexion strengthening, pain management   Consulted and Agree with Plan of Care Patient      Patient will benefit from skilled therapeutic intervention in order to improve the following deficits and impairments:  Abnormal gait, Decreased endurance, Decreased strength, Difficulty walking, Postural dysfunction, Improper body mechanics, Pain, Increased muscle spasms, Decreased range of motion  Visit Diagnosis: Chronic right-sided low back pain without sciatica  SI (sacroiliac) pain  Difficulty in walking, not elsewhere classified  Muscle spasm of back     Problem  List Patient Active Problem List   Diagnosis Date Noted  . Gastroesophageal reflux disease without esophagitis 01/07/2016  . Insect sting allergy, current reaction 01/07/2016  . Rhinitis, allergic 08/25/2015  . Recurrent ventral hernia 05/31/2014    Toy Baker, SPT 12/03/2016, 4:36 PM  [address] [address] [place], [place], [address] Phone: [phone]   Fax:  [phone]  Name: ANGELROSE FAGUNDO MRN: [id number] Date of Birth: 1941-08-13

## 2016-12-11 ENCOUNTER — Encounter: Payer: Self-pay | Admitting: Physical Therapy

## 2016-12-11 ENCOUNTER — Ambulatory Visit: Payer: Medicare Other | Admitting: Physical Therapy

## 2016-12-11 DIAGNOSIS — M62830 Muscle spasm of back: Secondary | ICD-10-CM

## 2016-12-11 DIAGNOSIS — G8929 Other chronic pain: Secondary | ICD-10-CM

## 2016-12-11 DIAGNOSIS — R262 Difficulty in walking, not elsewhere classified: Secondary | ICD-10-CM

## 2016-12-11 DIAGNOSIS — M533 Sacrococcygeal disorders, not elsewhere classified: Secondary | ICD-10-CM | POA: Diagnosis not present

## 2016-12-11 DIAGNOSIS — M545 Low back pain: Secondary | ICD-10-CM | POA: Diagnosis not present

## 2016-12-11 NOTE — Therapy (Signed)
[address] [place], [place], [address] Phone: [phone]   Fax:  [phone]  Physical Therapy Treatment  Patient Details  Name: TIMIA MWANGI MRN: [id number] Date of Birth: 10/10/1941 Referring Provider: Delilah Shan  Encounter Date: 12/11/2016      PT End of Session - 12/11/16 1528    Visit Number 2   Date for PT Re-Evaluation 12/31/16   PT Start Time [id number]   PT Stop Time 1545   PT Time Calculation (min) 49 min      Past Medical History:  Diagnosis Date  . Arthritis    left thumb and lower back,  . Atypical glandular cells of undetermined significance (AGUS) on cervical Pap smear 12/1998   endo polyp- [name]  . Disc degeneration, lumbar 2016   L4, L5, S1  . Endocervical polyp 1997  . Environmental allergies   . GERD (gastroesophageal reflux disease)   . Hypertension   . Interstitial cystitis 2004  . Melanoma of face ([place]) 04/2007  . Mild dysplasia of cervix    laser  . MRSA infection    many yrs ago -right hand(tx. with oral antibiotic)-no further issues  . Osteopenia    intolerant of bisphosphonates  . 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 Urethral stenosis    dilated    Past Surgical History:  Procedure Laterality Date  . COLONOSCOPY WITH PROPOFOL N/A 10/02/2015   Procedure: COLONOSCOPY WITH PROPOFOL;  Surgeon: Garlan Fair, MD;  [facility];  Service: Endoscopy;  Laterality: N/A;  . HAND SURGERY     cyst removed from rt hand  . HERNIA REPAIR  [id number], [id number]   umbilical mesh placed in August 2015.  Marland Kitchen HERNIA REPAIR  02-05-15   bilateral inguinal - mesh  . MELANOMA EXCISION Right    '08- right cheek- no futher issues  . varicose vein Left 2016   past years bilateral  . WISDOM TOOTH EXTRACTION      There were no vitals filed for this visit.      Subjective Assessment - 12/11/16 1525    Subjective over did it over holidays, back  spasms -TENS helped   Currently in Pain? Yes   Pain Score 6    Pain Location Back                         [facility] Adult PT Treatment/Exercise - 12/11/16 0001      Ultrasound   Ultrasound Location RT SI   Ultrasound Parameters 1 mhz 1.3 w/cm2 8 min   Ultrasound Goals Pain     Traction   Type of Traction Lumbar   Max (lbs) 60   Time 15     Manual Therapy   Manual Therapy Soft tissue mobilization;Passive ROM   Soft tissue mobilization RT SI   Passive ROM LE and trunk   Muscle Energy Technique = alignment                  PT Short Term Goals - 12/03/16 1629      PT SHORT TERM GOAL #1   Title Pt will report daily use of HEP.   Time 2   Period Weeks   Status New           PT Long Term Goals - 12/03/16 1630      PT LONG TERM GOAL #1   Title Pt will report 50%  decrease in pain.   Time 8   Period Weeks   Status New     PT LONG TERM GOAL #2   Title Pt will understand proper squat mechanics without compensatory patterns.   Time 8   Period Weeks   Status New     PT LONG TERM GOAL #3   Title Pt will increase bilateral hip strength to 5/5.    Time 8   Period Weeks   Status New               Plan - 12/11/16 1529    Clinical Impression Statement pt with equal alignment pt very tight and tender- reponded well to MT and modalities   PT Next Visit Plan PROM, muscle energy technique, body mechanics, hip flexion strengthening, pain management      Patient will benefit from skilled therapeutic intervention in order to improve the following deficits and impairments:  Abnormal gait, Decreased endurance, Decreased strength, Difficulty walking, Postural dysfunction, Improper body mechanics, Pain, Increased muscle spasms, Decreased range of motion  Visit Diagnosis: Chronic right-sided low back pain without sciatica  SI (sacroiliac) pain  Difficulty in walking, not elsewhere classified  Muscle spasm of back     Problem List Patient  Active Problem List   Diagnosis Date Noted  . Gastroesophageal reflux disease without esophagitis 01/07/2016  . Insect sting allergy, current reaction 01/07/2016  . Rhinitis, allergic 08/25/2015  . Recurrent ventral hernia 05/31/2014    Katy Brickell,ANGIE PTA 12/11/2016, 3:32 PM  [place] [address] [place], [place], [address] Phone: [phone]   Fax:  [phone]  Name: KYLEAH CAROLL MRN: [id number] Date of Birth: 01/26/1941

## 2016-12-12 ENCOUNTER — Ambulatory Visit (INDEPENDENT_AMBULATORY_CARE_PROVIDER_SITE_OTHER): Payer: Medicare Other

## 2016-12-12 DIAGNOSIS — J309 Allergic rhinitis, unspecified: Secondary | ICD-10-CM

## 2016-12-17 ENCOUNTER — Ambulatory Visit (INDEPENDENT_AMBULATORY_CARE_PROVIDER_SITE_OTHER): Payer: Medicare Other

## 2016-12-17 DIAGNOSIS — J309 Allergic rhinitis, unspecified: Secondary | ICD-10-CM | POA: Diagnosis not present

## 2016-12-18 ENCOUNTER — Ambulatory Visit: Payer: Medicare Other | Attending: Sports Medicine | Admitting: Physical Therapy

## 2016-12-18 ENCOUNTER — Encounter: Payer: Self-pay | Admitting: Physical Therapy

## 2016-12-18 ENCOUNTER — Other Ambulatory Visit: Payer: Self-pay | Admitting: Obstetrics and Gynecology

## 2016-12-18 DIAGNOSIS — R262 Difficulty in walking, not elsewhere classified: Secondary | ICD-10-CM | POA: Insufficient documentation

## 2016-12-18 DIAGNOSIS — M533 Sacrococcygeal disorders, not elsewhere classified: Secondary | ICD-10-CM | POA: Insufficient documentation

## 2016-12-18 DIAGNOSIS — M545 Low back pain: Secondary | ICD-10-CM | POA: Insufficient documentation

## 2016-12-18 DIAGNOSIS — M62830 Muscle spasm of back: Secondary | ICD-10-CM | POA: Insufficient documentation

## 2016-12-18 DIAGNOSIS — G8929 Other chronic pain: Secondary | ICD-10-CM | POA: Insufficient documentation

## 2016-12-18 MED ORDER — ESTRADIOL 0.1 MG/GM VA CREA: TOPICAL_CREAM | VAGINAL | 0 refills | Status: DC

## 2016-12-18 NOTE — Therapy (Signed)
[address] [place], [place], [address] Phone: [phone]   Fax:  [phone]  Physical Therapy Treatment  Patient Details  Name: Teresa Morales MRN: [id number] Date of Birth: 1941/07/08 Referring Provider: Delilah Shan  Encounter Date: 12/18/2016      PT End of Session - 12/18/16 1215    Visit Number 3   Date for PT Re-Evaluation 12/31/16   PT Start Time 1130   PT Stop Time 1230   PT Time Calculation (min) 60 min      Past Medical History:  Diagnosis Date  . Arthritis    left thumb and lower back,  . Atypical glandular cells of undetermined significance (AGUS) on cervical Pap smear 12/1998   endo polyp- [name]  . Disc degeneration, lumbar 2016   L4, L5, S1  . Endocervical polyp 1997  . Environmental allergies   . GERD (gastroesophageal reflux disease)   . Hypertension   . Interstitial cystitis 2004  . Melanoma of face ([place]) 04/2007  . Mild dysplasia of cervix    laser  . MRSA infection    many yrs ago -right hand(tx. with oral antibiotic)-no further issues  . Osteopenia    intolerant of bisphosphonates  . 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 Urethral stenosis    dilated    Past Surgical History:  Procedure Laterality Date  . COLONOSCOPY WITH PROPOFOL N/A 10/02/2015   Procedure: COLONOSCOPY WITH PROPOFOL;  Surgeon: Garlan Fair, MD;  [facility];  Service: Endoscopy;  Laterality: N/A;  . HAND SURGERY     cyst removed from rt hand  . HERNIA REPAIR  [id number], 07/19/01   umbilical mesh placed in August 2015.  Marland Kitchen HERNIA REPAIR  02-05-15   bilateral inguinal - mesh  . MELANOMA EXCISION Right    '08- right cheek- no futher issues  . varicose vein Left 2016   past years bilateral  . WISDOM TOOTH EXTRACTION      There were no vitals filed for this visit.      Subjective Assessment - 12/18/16 1149    Subjective RT side hurt last night, left  today- I think I am out of alignment   Currently in Pain? Yes   Pain Score 6    Pain Location Back                         [facility] Adult PT Treatment/Exercise - 12/18/16 0001      Lumbar Exercises: Aerobic   Stationary Bike L 4 5 min     Lumbar Exercises: Machines for Strengthening   Other Lumbar Machine Exercise seated row 20# 2 sets 15     Lumbar Exercises: Seated   Sit to Stand Limitations sit fit pelvic ROM and stab ex     Knee/Hip Exercises: Standing   Other Standing Knee Exercises red tband hip ext and abd 10 times each     Knee/Hip Exercises: Seated   Ball Squeeze 15   Clamshell with TheraBand Red     Modalities   Modalities Moist Heat;Electrical Stimulation     Moist Heat Therapy   Number Minutes Moist Heat 15 Minutes   Moist Heat Location Lumbar Spine     Electrical Stimulation   Electrical Stimulation Location LB/SI   Electrical Stimulation Action IFC   Electrical Stimulation Parameters supine   Electrical Stimulation Goals Pain     Manual Therapy  Manual Therapy Muscle Energy Technique   Manual therapy comments RT post rotated SI. Pain on Left                PT Education - 12/18/16 1214    Education provided Yes   Education Details reviewed HEP for supine stab ex. reviewed MET   Person(s) Educated Patient   Methods Explanation   Comprehension Verbalized understanding;Returned demonstration          PT Short Term Goals - 12/18/16 1215      PT SHORT TERM GOAL #1   Title Pt will report daily use of HEP.   Status Achieved           PT Long Term Goals - 12/03/16 1630      PT LONG TERM GOAL #1   Title Pt will report 50% decrease in pain.   Time 8   Period Weeks   Status New     PT LONG TERM GOAL #2   Title Pt will understand proper squat mechanics without compensatory patterns.   Time 8   Period Weeks   Status New     PT LONG TERM GOAL #3   Title Pt will increase bilateral hip strength to 5/5.    Time 8    Period Weeks   Status New               Plan - 12/18/16 1215    Clinical Impression Statement pt tolerated ther ex well and has good understanding of HEP and MET. RT post rotaion but pain on left today- pt is hypermobile bilaterally . STG met   PT Next Visit Plan PROM, muscle energy technique, body mechanics, hip flexion strengthening, pain management      Patient will benefit from skilled therapeutic intervention in order to improve the following deficits and impairments:  Abnormal gait, Decreased endurance, Decreased strength, Difficulty walking, Postural dysfunction, Improper body mechanics, Pain, Increased muscle spasms, Decreased range of motion  Visit Diagnosis: Chronic right-sided low back pain without sciatica  SI (sacroiliac) pain  Difficulty in walking, not elsewhere classified  Muscle spasm of back     Problem List Patient Active Problem List   Diagnosis Date Noted  . Gastroesophageal reflux disease without esophagitis 01/07/2016  . Insect sting allergy, current reaction 01/07/2016  . Rhinitis, allergic 08/25/2015  . Recurrent ventral hernia 05/31/2014    Daekwon Beswick,ANGIE PTA 12/18/2016, 12:16 PM  [place] [address] [place], [place], [address] Phone: [phone]   Fax:  [phone]  Name: Teresa Morales MRN: [id number] Date of Birth: January 27, 1941

## 2016-12-18 NOTE — Telephone Encounter (Signed)
Medication refill request: Estrace Cream  Last AEX:  04-09-16  Next AEX: 04-16-17  Last MMG (if hormonal medication request): 04-17-16 WN L Refill authorized: please advise

## 2016-12-18 NOTE — Telephone Encounter (Signed)
Patient is asking for refills of Estrace cream. Confirmed pharmacy on file.

## 2016-12-20 DIAGNOSIS — M25571 Pain in right ankle and joints of right foot: Secondary | ICD-10-CM | POA: Diagnosis not present

## 2016-12-23 ENCOUNTER — Ambulatory Visit: Payer: Medicare Other | Admitting: Physical Therapy

## 2016-12-25 ENCOUNTER — Ambulatory Visit: Payer: Medicare Other | Admitting: Physical Therapy

## 2016-12-25 ENCOUNTER — Ambulatory Visit (INDEPENDENT_AMBULATORY_CARE_PROVIDER_SITE_OTHER): Payer: Medicare Other

## 2016-12-25 ENCOUNTER — Encounter: Payer: Self-pay | Admitting: Physical Therapy

## 2016-12-25 DIAGNOSIS — M62830 Muscle spasm of back: Secondary | ICD-10-CM | POA: Diagnosis not present

## 2016-12-25 DIAGNOSIS — R262 Difficulty in walking, not elsewhere classified: Secondary | ICD-10-CM

## 2016-12-25 DIAGNOSIS — J309 Allergic rhinitis, unspecified: Secondary | ICD-10-CM

## 2016-12-25 DIAGNOSIS — T63441D Toxic effect of venom of bees, accidental (unintentional), subsequent encounter: Secondary | ICD-10-CM | POA: Diagnosis not present

## 2016-12-25 DIAGNOSIS — G8929 Other chronic pain: Secondary | ICD-10-CM | POA: Diagnosis not present

## 2016-12-25 DIAGNOSIS — M533 Sacrococcygeal disorders, not elsewhere classified: Secondary | ICD-10-CM

## 2016-12-25 DIAGNOSIS — M545 Low back pain: Secondary | ICD-10-CM | POA: Diagnosis not present

## 2016-12-25 NOTE — Therapy (Signed)
[place] [address] [place], [place], [address] Phone: [phone]   Fax:  [phone]  Physical Therapy Treatment  Patient Details  Name: Teresa Morales MRN: [id number] Date of Birth: Apr 03, 1941 Referring Provider: Delilah Shan  Encounter Date: 12/25/2016    Past Medical History:  Diagnosis Date  . Arthritis    left thumb and lower back,  . Atypical glandular cells of undetermined significance (AGUS) on cervical Pap smear 12/1998   endo polyp- [name]  . Disc degeneration, lumbar 2016   L4, L5, S1  . Endocervical polyp 1997  . Environmental allergies   . GERD (gastroesophageal reflux disease)   . Hypertension   . Interstitial cystitis 2004  . Melanoma of face ([place]) 04/2007  . Mild dysplasia of cervix    laser  . MRSA infection    many yrs ago -right hand(tx. with oral antibiotic)-no further issues  . Osteopenia    intolerant of bisphosphonates  . 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 Urethral stenosis    dilated    Past Surgical History:  Procedure Laterality Date  . COLONOSCOPY WITH PROPOFOL N/A 10/02/2015   Procedure: COLONOSCOPY WITH PROPOFOL;  Surgeon: Garlan Fair, MD;  [facility];  Service: Endoscopy;  Laterality: N/A;  . HAND SURGERY     cyst removed from rt hand  . HERNIA REPAIR  [id number], [id number]   umbilical mesh placed in August 2015.  Marland Kitchen HERNIA REPAIR  02-05-15   bilateral inguinal - mesh  . MELANOMA EXCISION Right    '08- right cheek- no futher issues  . varicose vein Left 2016   past years bilateral  . WISDOM TOOTH EXTRACTION      There were no vitals filed for this visit.      Subjective Assessment - 12/25/16 1645    Subjective fell last week and tore ligaments in RT foot- I have been taking it easy but doing back stretches   Currently in Pain? Yes   Pain Score 4    Pain Location Back                         [facility]  Adult PT Treatment/Exercise - 12/25/16 0001      Lumbar Exercises: Aerobic   Stationary Bike 5 min     Lumbar Exercises: Supine   Other Supine Lumbar Exercises core stab 25 min     Traction   Type of Traction Lumbar  with heat   Max (lbs) 60   Time 15     Manual Therapy   Manual Therapy Muscle Energy Technique   Muscle Energy Technique = alignment before and after session                  PT Short Term Goals - 12/18/16 1215      PT SHORT TERM GOAL #1   Title Pt will report daily use of HEP.   Status Achieved           PT Long Term Goals - 12/25/16 1648      PT LONG TERM GOAL #1   Title Pt will report 50% decrease in pain.   Status On-going     PT LONG TERM GOAL #2   Title Pt will understand proper squat mechanics without compensatory patterns.   Status On-going     PT LONG TERM GOAL #3   Title Pt will increase bilateral hip  strength to 5/5.    Status On-going     PT LONG TERM GOAL #4   Title do yardwork and gardening without significant increase of pain   Status On-going             Patient will benefit from skilled therapeutic intervention in order to improve the following deficits and impairments:     Visit Diagnosis: Chronic right-sided low back pain without sciatica  SI (sacroiliac) pain  Difficulty in walking, not elsewhere classified  Muscle spasm of back     Problem List Patient Active Problem List   Diagnosis Date Noted  . Gastroesophageal reflux disease without esophagitis 01/07/2016  . Insect sting allergy, current reaction 01/07/2016  . Rhinitis, allergic 08/25/2015  . Recurrent ventral hernia 05/31/2014    Jared Whorley,ANGIE PTA 12/25/2016, 4:52 PM  [place], [place], [address] Phone: [phone]   Fax:  [phone]  Name: Teresa Morales MRN: [id number] Date of Birth: 01-21-1941

## 2016-12-30 ENCOUNTER — Ambulatory Visit (INDEPENDENT_AMBULATORY_CARE_PROVIDER_SITE_OTHER): Payer: Medicare Other | Admitting: *Deleted

## 2016-12-30 ENCOUNTER — Encounter: Payer: Self-pay | Admitting: Physical Therapy

## 2016-12-30 ENCOUNTER — Ambulatory Visit: Payer: Medicare Other | Admitting: Physical Therapy

## 2016-12-30 DIAGNOSIS — M62830 Muscle spasm of back: Secondary | ICD-10-CM | POA: Diagnosis not present

## 2016-12-30 DIAGNOSIS — J309 Allergic rhinitis, unspecified: Secondary | ICD-10-CM | POA: Diagnosis not present

## 2016-12-30 DIAGNOSIS — M545 Low back pain, unspecified: Secondary | ICD-10-CM

## 2016-12-30 DIAGNOSIS — M533 Sacrococcygeal disorders, not elsewhere classified: Secondary | ICD-10-CM

## 2016-12-30 DIAGNOSIS — G8929 Other chronic pain: Secondary | ICD-10-CM

## 2016-12-30 DIAGNOSIS — R262 Difficulty in walking, not elsewhere classified: Secondary | ICD-10-CM | POA: Diagnosis not present

## 2016-12-30 NOTE — Therapy (Signed)
[address] [place], [place], [address] Phone: [phone]   Fax:  [phone]  Physical Therapy Treatment  Patient Details  Name: Teresa Morales MRN: [id number] Date of Birth: 11/19/1941 Referring Provider: Delilah Shan  Encounter Date: 12/30/2016      PT End of Session - 12/30/16 1403    Visit Number 4   Date for PT Re-Evaluation 12/31/16   PT Start Time 1315   PT Stop Time 1415   PT Time Calculation (min) 60 min      Past Medical History:  Diagnosis Date  . Arthritis    left thumb and lower back,  . Atypical glandular cells of undetermined significance (AGUS) on cervical Pap smear 12/1998   endo polyp- [name]  . Disc degeneration, lumbar 2016   L4, L5, S1  . Endocervical polyp 1997  . Environmental allergies   . GERD (gastroesophageal reflux disease)   . Hypertension   . Interstitial cystitis 2004  . Melanoma of face ([place]) 04/2007  . Mild dysplasia of cervix    laser  . MRSA infection    many yrs ago -right hand(tx. with oral antibiotic)-no further issues  . Osteopenia    intolerant of bisphosphonates  . 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 Urethral stenosis    dilated    Past Surgical History:  Procedure Laterality Date  . COLONOSCOPY WITH PROPOFOL N/A 10/02/2015   Procedure: COLONOSCOPY WITH PROPOFOL;  Surgeon: Garlan Fair, MD;  Location: [facility];  Service: Endoscopy;  Laterality: N/A;  . HAND SURGERY     cyst removed from rt hand  . HERNIA REPAIR  [id number], 02/13/53   umbilical mesh placed in August 2015.  Marland Kitchen HERNIA REPAIR  02-05-15   bilateral inguinal - mesh  . MELANOMA EXCISION Right    '08- right cheek- no futher issues  . varicose vein Left 2016   past years bilateral  . WISDOM TOOTH EXTRACTION      There were no vitals filed for this visit.      Subjective Assessment - 12/30/16 1401    Subjective some soreness in foot after bike  last session but ice helped, slight soreness RT SI   Currently in Pain? Yes   Pain Score 3    Pain Location Back   Pain Orientation Right                         [facility] Adult PT Treatment/Exercise - 12/30/16 0001      Lumbar Exercises: Aerobic   Stationary Bike 5 min     Lumbar Exercises: Seated   Sit to Stand Limitations sit fit pelvic ROM and stab ex  LE ther ex on sit fit red tband     Lumbar Exercises: Supine   Other Supine Lumbar Exercises core stab 15 min     Traction   Type of Traction Lumbar  with heat   Max (lbs) 60   Time 15     Manual Therapy   Muscle Energy Technique + alignment at [facility] and after ther ex                  PT Short Term Goals - 12/18/16 1215      PT SHORT TERM GOAL #1   Title Pt will report daily use of HEP.   Status Achieved           PT  Long Term Goals - 12/30/16 1404      PT LONG TERM GOAL #1   Title Pt will report 50% decrease in pain.   Status Partially Met     PT LONG TERM GOAL #2   Title Pt will understand proper squat mechanics without compensatory patterns.   Status Partially Met     PT LONG TERM GOAL #3   Title Pt will increase bilateral hip strength to 5/5.    Status On-going     PT LONG TERM GOAL #4   Title do yardwork and gardening without significant increase of pain   Status On-going               Plan - 12/30/16 1404    Clinical Impression Statement pt tolerated ther ex without increased pain -focus on core stab for SI stab with cuing for correct tech. Progressing with all LTGs   PT Next Visit Plan  muscle energy technique, body mechanics, hip flexion strengthening, pain management. CORE STAB      Patient will benefit from skilled therapeutic intervention in order to improve the following deficits and impairments:  Abnormal gait, Decreased endurance, Decreased strength, Difficulty walking, Postural dysfunction, Improper body mechanics, Pain, Increased muscle spasms, Decreased  range of motion  Visit Diagnosis: Chronic right-sided low back pain without sciatica  SI (sacroiliac) pain  Difficulty in walking, not elsewhere classified     Problem List Patient Active Problem List   Diagnosis Date Noted  . Gastroesophageal reflux disease without esophagitis 01/07/2016  . Insect sting allergy, current reaction 01/07/2016  . Rhinitis, allergic 08/25/2015  . Recurrent ventral hernia 05/31/2014    Lynnita Somma,ANGIE PTA 12/30/2016, 2:05 PM  [address] [place], [place], [address] Phone: [phone]   Fax:  [phone]  Name: Teresa Morales MRN: [id number] Date of Birth: Dec 08, 1941

## 2017-01-01 ENCOUNTER — Ambulatory Visit: Payer: Medicare Other | Admitting: Physical Therapy

## 2017-01-05 DIAGNOSIS — M25571 Pain in right ankle and joints of right foot: Secondary | ICD-10-CM | POA: Diagnosis not present

## 2017-01-06 ENCOUNTER — Encounter: Payer: Self-pay | Admitting: Physical Therapy

## 2017-01-06 ENCOUNTER — Ambulatory Visit: Payer: Medicare Other | Admitting: Physical Therapy

## 2017-01-06 DIAGNOSIS — M533 Sacrococcygeal disorders, not elsewhere classified: Secondary | ICD-10-CM | POA: Diagnosis not present

## 2017-01-06 DIAGNOSIS — R262 Difficulty in walking, not elsewhere classified: Secondary | ICD-10-CM

## 2017-01-06 DIAGNOSIS — G8929 Other chronic pain: Secondary | ICD-10-CM

## 2017-01-06 DIAGNOSIS — M545 Low back pain: Principal | ICD-10-CM

## 2017-01-06 DIAGNOSIS — M62830 Muscle spasm of back: Secondary | ICD-10-CM | POA: Diagnosis not present

## 2017-01-06 NOTE — Therapy (Signed)
[address] [place], [place], [address] Phone: [phone]   Fax:  [phone]  Physical Therapy Treatment  Patient Details  Name: Teresa Morales MRN: [id number] Date of Birth: 1941/04/11 Referring Provider: Delilah Shan  Encounter Date: 01/06/2017      PT End of Session - 01/06/17 1316    Visit Number 5   Date for PT Re-Evaluation 12/31/16   PT Start Time [id number]   PT Stop Time 1330   PT Time Calculation (min) 55 min      Past Medical History:  Diagnosis Date  . Arthritis    left thumb and lower back,  . Atypical glandular cells of undetermined significance (AGUS) on cervical Pap smear 12/1998   endo polyp- [name]  . Disc degeneration, lumbar 2016   L4, L5, S1  . Endocervical polyp 1997  . Environmental allergies   . GERD (gastroesophageal reflux disease)   . Hypertension   . Interstitial cystitis 2004  . Melanoma of face ([place]) 04/2007  . Mild dysplasia of cervix    laser  . MRSA infection    many yrs ago -right hand(tx. with oral antibiotic)-no further issues  . Osteopenia    intolerant of bisphosphonates  . 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 Urethral stenosis    dilated    Past Surgical History:  Procedure Laterality Date  . COLONOSCOPY WITH PROPOFOL N/A 10/02/2015   Procedure: COLONOSCOPY WITH PROPOFOL;  Surgeon: Garlan Fair, MD;  Location: [facility];  Service: Endoscopy;  Laterality: N/A;  . HAND SURGERY     cyst removed from rt hand  . HERNIA REPAIR  [id number], [id number]   umbilical mesh placed in August 2015.  Marland Kitchen HERNIA REPAIR  02-05-15   bilateral inguinal - mesh  . MELANOMA EXCISION Right    '08- right cheek- no futher issues  . varicose vein Left 2016   past years bilateral  . WISDOM TOOTH EXTRACTION      There were no vitals filed for this visit.      Subjective Assessment - 01/06/17 1311    Subjective MD gave me some ex can you help  me with for my ankle,back is doing better   Currently in Pain? Yes   Pain Score 2    Pain Location Back   Pain Orientation Right                         [facility] Adult PT Treatment/Exercise - 01/06/17 0001      Lumbar Exercises: Aerobic   Stationary Bike Nustep L 4 6 min     Lumbar Exercises: Standing   Other Standing Lumbar Exercises red scap stab 15 times each   Other Standing Lumbar Exercises red tband hip 3 way 15 times each     Traction   Type of Traction Lumbar  with MH   Max (lbs) 60   Time 15     Manual Therapy   Muscle Energy Technique = alignment today                PT Education - 01/06/17 1315    Education provided Yes   Education Details reviewed HEP supine core stab, scap and hip with tband and ankle ther from MD   Person(s) Educated Patient   Methods Explanation;Demonstration;Handout   Comprehension Verbalized understanding;Returned demonstration          PT Short Term Goals -  12/18/16 1215      PT SHORT TERM GOAL #1   Title Pt will report daily use of HEP.   Status Achieved           PT Long Term Goals - 01/06/17 1318      PT LONG TERM GOAL #1   Title Pt will report 50% decrease in pain.   Status Achieved     PT LONG TERM GOAL #2   Title Pt will understand proper squat mechanics without compensatory patterns.   Status On-going     PT LONG TERM GOAL #3   Title Pt will increase bilateral hip strength to 5/5.    Status On-going     PT LONG TERM GOAL #4   Title do yardwork and gardening without significant increase of pain   Status On-going               Plan - 01/06/17 1317    Clinical Impression Statement pt doing very well and able to maintain SI alignment and stability, progressing with all goals and resuming HEPs   PT Next Visit Plan start decreasing freq of sessions and wean towards D/C with HEP      Patient will benefit from skilled therapeutic intervention in order to improve the following  deficits and impairments:  Abnormal gait, Decreased endurance, Decreased strength, Difficulty walking, Postural dysfunction, Improper body mechanics, Pain, Increased muscle spasms, Decreased range of motion  Visit Diagnosis: Chronic right-sided low back pain without sciatica  SI (sacroiliac) pain  Difficulty in walking, not elsewhere classified     Problem List Patient Active Problem List   Diagnosis Date Noted  . Gastroesophageal reflux disease without esophagitis 01/07/2016  . Insect sting allergy, current reaction 01/07/2016  . Rhinitis, allergic 08/25/2015  . Recurrent ventral hernia 05/31/2014    Laster Appling,ANGIE PTA 01/06/2017, 1:19 PM  [address] [place], [place], [address] Phone: [phone]   Fax:  [phone]  Name: Teresa Morales MRN: [id number] Date of Birth: [birth year]

## 2017-01-08 ENCOUNTER — Ambulatory Visit: Payer: Medicare Other | Admitting: Physical Therapy

## 2017-01-09 ENCOUNTER — Ambulatory Visit (INDEPENDENT_AMBULATORY_CARE_PROVIDER_SITE_OTHER): Payer: Medicare Other

## 2017-01-09 DIAGNOSIS — J309 Allergic rhinitis, unspecified: Secondary | ICD-10-CM

## 2017-01-13 ENCOUNTER — Ambulatory Visit: Payer: Medicare Other | Admitting: Physical Therapy

## 2017-01-13 ENCOUNTER — Encounter: Payer: Self-pay | Admitting: Physical Therapy

## 2017-01-13 DIAGNOSIS — M545 Low back pain, unspecified: Secondary | ICD-10-CM

## 2017-01-13 DIAGNOSIS — M533 Sacrococcygeal disorders, not elsewhere classified: Secondary | ICD-10-CM | POA: Diagnosis not present

## 2017-01-13 DIAGNOSIS — G8929 Other chronic pain: Secondary | ICD-10-CM | POA: Diagnosis not present

## 2017-01-13 DIAGNOSIS — R262 Difficulty in walking, not elsewhere classified: Secondary | ICD-10-CM | POA: Diagnosis not present

## 2017-01-13 DIAGNOSIS — M62830 Muscle spasm of back: Secondary | ICD-10-CM | POA: Diagnosis not present

## 2017-01-13 NOTE — Therapy (Signed)
[place] [address] [place], [place], [address] Phone: [phone]   Fax:  [phone]  Physical Therapy Treatment  Patient Details  Name: Teresa Morales MRN: [id number] Date of Birth: [birth year] Referring Provider: Delilah Shan  Encounter Date: 01/13/2017      PT End of Session - 01/13/17 1323    Visit Number 6   Date for PT Re-Evaluation 12/31/16   PT Start Time [id number]   PT Stop Time 1340   PT Time Calculation (min) 65 min      Past Medical History:  Diagnosis Date  . Arthritis    left thumb and lower back,  . Atypical glandular cells of undetermined significance (AGUS) on cervical Pap smear 12/1998   endo polyp- [name]  . Disc degeneration, lumbar 2016   L4, L5, S1  . Endocervical polyp 1997  . Environmental allergies   . GERD (gastroesophageal reflux disease)   . Hypertension   . Interstitial cystitis 2004  . Melanoma of face ([place]) 04/2007  . Mild dysplasia of cervix    laser  . MRSA infection    many yrs ago -right hand(tx. with oral antibiotic)-no further issues  . Osteopenia    intolerant of bisphosphonates  . 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 Urethral stenosis    dilated    Past Surgical History:  Procedure Laterality Date  . COLONOSCOPY WITH PROPOFOL N/A 10/02/2015   Procedure: COLONOSCOPY WITH PROPOFOL;  Surgeon: Garlan Fair, MD;  Location: [facility];  Service: Endoscopy;  Laterality: N/A;  . HAND SURGERY     cyst removed from rt hand  . HERNIA REPAIR  [id number], 12/17/43   umbilical mesh placed in August 2015.  Marland Kitchen HERNIA REPAIR  02-05-15   bilateral inguinal - mesh  . MELANOMA EXCISION Right    '08- right cheek- no futher issues  . varicose vein Left 2016   past years bilateral  . WISDOM TOOTH EXTRACTION      There were no vitals filed for this visit.      Subjective Assessment - 01/13/17 1236    Subjective had a bug so didnt do ex like I  should, overall 75% better   Currently in Pain? Yes   Pain Score 1    Pain Location Back                         [facility] Adult PT Treatment/Exercise - 01/13/17 0001      Lumbar Exercises: Aerobic   Stationary Bike Nustep L 4 6 min     Lumbar Exercises: Machines for Strengthening   Cybex Lumbar Extension black tband 2 sets 10   Cybex Knee Extension 10# 2 sets 10   Cybex Knee Flexion 20# 2 sets 10   Leg Press 20# 2 sets 10   Other Lumbar Machine Exercise seated row and lats 25# 2 sets 15     Lumbar Exercises: Seated   Sit to Stand Limitations sit fit pelvic ROM and stab ex     Lumbar Exercises: Supine   Ab Set --  red tbadn LE ex on sit fit     Moist Heat Therapy   Number Minutes Moist Heat 15 Minutes   Moist Heat Location Lumbar Spine     Traction   Type of Traction Lumbar   Max (lbs) 60   Time 15     Manual Therapy   Manual Therapy  Muscle Energy Technique  = alignment SOC and end                  PT Short Term Goals - 12/18/16 1215      PT SHORT TERM GOAL #1   Title Pt will report daily use of HEP.   Status Achieved           PT Long Term Goals - 01/13/17 1323      PT LONG TERM GOAL #1   Title Pt will report 50% decrease in pain.   Status Achieved     PT LONG TERM GOAL #2   Title Pt will understand proper squat mechanics without compensatory patterns.   Status Achieved     PT LONG TERM GOAL #3   Title Pt will increase bilateral hip strength to 5/5.    Baseline does fatigue easily but does test 5/5   Status Achieved     PT LONG TERM GOAL #4   Title do yardwork and gardening without significant increase of pain   Status On-going               Plan - 01/13/17 1324    Clinical Impression Statement all goals met except work in yard and not tried d/t weather. Resumed HEP- weakness in core and stressed imp of increased strengthening prior to return to yard work   PT Next Visit Plan HOLD 2 weeks with HEP       Patient will benefit from skilled therapeutic intervention in order to improve the following deficits and impairments:  Abnormal gait, Decreased endurance, Decreased strength, Difficulty walking, Postural dysfunction, Improper body mechanics, Pain, Increased muscle spasms, Decreased range of motion  Visit Diagnosis: Chronic right-sided low back pain without sciatica  SI (sacroiliac) pain  Difficulty in walking, not elsewhere classified     Problem List Patient Active Problem List   Diagnosis Date Noted  . Gastroesophageal reflux disease without esophagitis 01/07/2016  . Insect sting allergy, current reaction 01/07/2016  . Rhinitis, allergic 08/25/2015  . Recurrent ventral hernia 05/31/2014    Kym Scannell,ANGIE PTA 01/13/2017, 1:26 PM  [place] [address] [place], [place], [address] Phone: [phone]   Fax:  [phone]  Name: IDALY VERRET MRN: [id number] Date of Birth: 1941-11-27

## 2017-01-15 ENCOUNTER — Encounter: Payer: Self-pay | Admitting: Allergy & Immunology

## 2017-01-15 ENCOUNTER — Ambulatory Visit (INDEPENDENT_AMBULATORY_CARE_PROVIDER_SITE_OTHER): Payer: Medicare Other | Admitting: Allergy & Immunology

## 2017-01-15 ENCOUNTER — Encounter (INDEPENDENT_AMBULATORY_CARE_PROVIDER_SITE_OTHER): Payer: Self-pay

## 2017-01-15 VITALS — BP 140/88 | HR 71 | Temp 97.4°F | Resp 15 | Ht 62.5 in | Wt 124.0 lb

## 2017-01-15 DIAGNOSIS — T63441D Toxic effect of venom of bees, accidental (unintentional), subsequent encounter: Secondary | ICD-10-CM

## 2017-01-15 DIAGNOSIS — J3089 Other allergic rhinitis: Secondary | ICD-10-CM | POA: Diagnosis not present

## 2017-01-15 MED ORDER — CARBINOXAMINE MALEATE ER 4 MG/5ML PO SUER: 4.0000 mg | Freq: Every day | ORAL | 5 refills | Status: DC

## 2017-01-15 MED ORDER — AZELASTINE-FLUTICASONE 137-50 MCG/ACT NA SUSP: 2.0000 | Freq: Every day | NASAL | 3 refills | Status: DC

## 2017-01-15 NOTE — Progress Notes (Signed)
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 Toxic effect of venom of bees - Continue with venom immunotherapy once monthly.  - EpiPen is up to date.   2. Chronic nonseasonal allergic rhinitis due to fungal spores - We will move your shots to once weekly for the foreseeable future. - Stop Xyzal and start Allegra one tablet every morning (samples provided). - Start Karbinal 71mL every night to see if this provides improved coverage (samples provided).  - Stop current nasal sprays and start Dymista 2 sprays per nostril 1-2 times daily.  3. Return in about 3 months (around 04/14/2017).  Subjective:   Teresa Morales is a 76 y.o. female presenting today for follow up of  Chief Complaint  Patient presents with  . Follow-up    allergies- headaches for a couple of weeks- some head pressure     Teresa Morales has a history of the following: Patient Active Problem List   Diagnosis Date Noted  . Gastroesophageal reflux disease without esophagitis 01/07/2016  . Insect sting allergy, current reaction 01/07/2016  . Rhinitis, allergic 08/25/2015  . Recurrent ventral hernia 05/31/2014    History obtained from: chart review and patient.  Teresa Morales was referred by Henrine Screws, MD.     Teresa Morales is a 76 y.o. female presenting for a follow up visit. She was last seen in January 2017 by Dr. Shaune Leeks. At that time, she was doing well with nasal congestion the spring. She underwent repeat testing at that time and was positive common indoor mold on intradermal testing. Her shots were remixed and she was restarted on allergy shots. She is currently receiving one injection of mold on schedule C, currently at 0.59mL of the Red Vial.  Since last visit, the patient has done fairly well. However, when she reached 0.5 mL of  the red bottle, she was changed to every 2 weeks. She has noticed that her symptoms tend to get worse after one week following the injection. Her headaches, which been the hallmark of her allergies for the past 40 years, become worse. She has been on multiple nasal sprays in the past, including Rhinocort, Nasacort, Astelin as well as Flonase. Currently, she is getting Nasacort and Astelin 2 sprays per nostril twice daily in the morning as well as at night. She has never tried [facility]. She has used a variety of antihistamines including Allegra, Claritin, Clarinex, and Zyrtec with minimal improvement. She seems to use an antihistamine for around 5-7 years, at which time it tends to stop working adequately.   The patient is quite verbose today and shares her entire history of allergies, starting in the 1970s. She has lived in [place], [place], and then moved to [place] in the early 2000s. She has received immunotherapy the entire time since 1970, aside from a 6 month period when she was off of allergy shots. When she is off of allergy shots, she is quite miserable.  Otherwise, there have been no changes to her past medical history, surgical history, family history, or social history. 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 She lives at home with her husband, who is also retired. She does have one daughter and two grand children.     Review of Systems: a 14-point review of systems is pertinent  for what is mentioned in HPI.  Otherwise, all other systems were negative. Constitutional: negative other than that listed in the HPI Eyes: negative other than that listed in the HPI Ears, nose, mouth, throat, and face: negative other than that listed in the HPI Respiratory: negative other than that listed in the HPI Cardiovascular: negative other than that listed in the HPI Gastrointestinal: negative other than that listed in the HPI Genitourinary: negative other  than that listed in the HPI Integument: negative other than that listed in the HPI Hematologic: negative other than that listed in the HPI Musculoskeletal: negative other than that listed in the HPI Neurological: negative other than that listed in the HPI Allergy/Immunologic: negative other than that listed in the HPI    Objective:   Blood pressure 140/88, pulse 71, temperature 97.4 F (36.3 C), temperature source Oral, resp. rate 15, height 5' 2.5" (1.588 m), weight 124 lb (56.2 kg), last menstrual period 12/15/1993, SpO2 97 %. Body mass index is 22.32 kg/m.   Physical Exam:  General: Alert, interactive, in no acute distress. Cooperative with the exam. Eyes: No conjunctival injection present on the right, No conjunctival injection present on the left, PERRL bilaterally, No discharge on the right, No discharge on the left and No Horner-Trantas dots present Ears: Right TM pearly gray with normal light reflex, Left TM pearly gray with normal light reflex, Right TM intact without perforation and Left TM intact without perforation.  Nose/Throat: External nose within normal limits, nasal crease present and septum midline, turbinates edematous with clear discharge, post-pharynx moderately erythematous with cobblestoning in the posterior oropharynx. Tonsils 2+ without exudates Neck: Supple without thyromegaly. Lungs: Clear to auscultation without wheezing, rhonchi or rales. No increased work of breathing. CV: Normal S1/S2, no murmurs. Capillary refill <2 seconds.  Skin: Warm and dry, without lesions or rashes. Neuro:   Grossly intact. No focal deficits appreciated. Responsive to questions.   Diagnostic studies: none    Salvatore Marvel, MD [facility]

## 2017-01-15 NOTE — Patient Instructions (Addendum)
1. Toxic effect of venom of bees - Continue with venom immunotherapy once monthly.  - EpiPen is up to date.   2. Chronic nonseasonal allergic rhinitis due to fungal spores - We will move your shots to once weekly for the foreseeable future. - Stop Xyzal and start Allegra one tablet every morning. - Start Karbinal 55mL every night (another antihistamine that can cause some sleepiness). - Stop current nasal sprays and start Dymista 2 sprays per nostril 1-2 times daily.  3. Return in about 3 months (around 04/14/2017).  Please inform us of any Emergency Department visits, hospitalizations, or changes in symptoms. Call us before going to the ED for breathing or allergy symptoms since we might be able to fit you in for a sick visit. Feel free to contact us anytime with any questions, problems, or concerns.  It was a pleasure to meet you today! Best wishes in the [place] Year!   Websites that have reliable patient information: 1. American Academy of Asthma, Allergy, and Immunology: www.aaaai.org 2. Food Allergy Research and Education (FARE): foodallergy.org 3. Mothers of Asthmatics: http://www.asthmacommunitynetwork.org 4. American College of Allergy, Asthma, and Immunology: www.acaai.org

## 2017-01-16 ENCOUNTER — Ambulatory Visit (INDEPENDENT_AMBULATORY_CARE_PROVIDER_SITE_OTHER): Payer: Medicare Other

## 2017-01-16 DIAGNOSIS — J309 Allergic rhinitis, unspecified: Secondary | ICD-10-CM | POA: Diagnosis not present

## 2017-01-26 NOTE — Addendum Note (Signed)
Addended by: Felipa Emory on: 01/26/2017 10:46 AM   Modules accepted: Orders

## 2017-01-27 ENCOUNTER — Ambulatory Visit (INDEPENDENT_AMBULATORY_CARE_PROVIDER_SITE_OTHER): Payer: Medicare Other | Admitting: *Deleted

## 2017-01-27 DIAGNOSIS — J309 Allergic rhinitis, unspecified: Secondary | ICD-10-CM | POA: Diagnosis not present

## 2017-02-09 ENCOUNTER — Other Ambulatory Visit: Payer: Self-pay | Admitting: Allergy & Immunology

## 2017-02-09 ENCOUNTER — Ambulatory Visit (INDEPENDENT_AMBULATORY_CARE_PROVIDER_SITE_OTHER): Payer: Medicare Other | Admitting: *Deleted

## 2017-02-09 DIAGNOSIS — J309 Allergic rhinitis, unspecified: Secondary | ICD-10-CM | POA: Diagnosis not present

## 2017-02-17 ENCOUNTER — Ambulatory Visit: Payer: Medicare Other | Admitting: Physical Therapy

## 2017-02-19 ENCOUNTER — Ambulatory Visit (INDEPENDENT_AMBULATORY_CARE_PROVIDER_SITE_OTHER): Payer: Medicare Other | Admitting: *Deleted

## 2017-02-19 ENCOUNTER — Encounter: Payer: Self-pay | Admitting: Physical Therapy

## 2017-02-19 ENCOUNTER — Ambulatory Visit: Payer: Medicare Other | Attending: Sports Medicine | Admitting: Physical Therapy

## 2017-02-19 DIAGNOSIS — M545 Low back pain: Secondary | ICD-10-CM | POA: Diagnosis not present

## 2017-02-19 DIAGNOSIS — R262 Difficulty in walking, not elsewhere classified: Secondary | ICD-10-CM | POA: Diagnosis not present

## 2017-02-19 DIAGNOSIS — M533 Sacrococcygeal disorders, not elsewhere classified: Secondary | ICD-10-CM | POA: Insufficient documentation

## 2017-02-19 DIAGNOSIS — G8929 Other chronic pain: Secondary | ICD-10-CM | POA: Diagnosis not present

## 2017-02-19 DIAGNOSIS — M7671 Peroneal tendinitis, right leg: Secondary | ICD-10-CM | POA: Diagnosis not present

## 2017-02-19 DIAGNOSIS — M62830 Muscle spasm of back: Secondary | ICD-10-CM | POA: Diagnosis not present

## 2017-02-19 DIAGNOSIS — T63441D Toxic effect of venom of bees, accidental (unintentional), subsequent encounter: Secondary | ICD-10-CM | POA: Diagnosis not present

## 2017-02-19 DIAGNOSIS — M25571 Pain in right ankle and joints of right foot: Secondary | ICD-10-CM | POA: Diagnosis not present

## 2017-02-19 NOTE — Therapy (Signed)
[address] [place], [place], [address] Phone: [phone]   Fax:  [phone]  Physical Therapy Treatment  Patient Details  Name: Teresa Morales MRN: [id number] Date of Birth: Mar 18, 1941 Referring Provider: Delilah Shan  Encounter Date: 02/19/2017      PT End of Session - 02/19/17 1031    Visit Number 7   Date for PT Re-Evaluation 12/31/16   PT Start Time 1025   PT Stop Time 1120   PT Time Calculation (min) 55 min      Past Medical History:  Diagnosis Date  . Arthritis    left thumb and lower back,  . Atypical glandular cells of undetermined significance (AGUS) on cervical Pap smear 12/1998   endo polyp- [name]  . Disc degeneration, lumbar 2016   L4, L5, S1  . Endocervical polyp 1997  . Environmental allergies   . GERD (gastroesophageal reflux disease)   . Hypertension   . Interstitial cystitis 2004  . Melanoma of face ([place]) 04/2007  . Mild dysplasia of cervix    laser  . MRSA infection    many yrs ago -right hand(tx. with oral antibiotic)-no further issues  . Osteopenia    intolerant of bisphosphonates  . 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 Urethral stenosis    dilated    Past Surgical History:  Procedure Laterality Date  . COLONOSCOPY WITH PROPOFOL N/A 10/02/2015   Procedure: COLONOSCOPY WITH PROPOFOL;  Surgeon: Garlan Fair, MD;  Location: [facility];  Service: Endoscopy;  Laterality: N/A;  . HAND SURGERY     cyst removed from rt hand  . HERNIA REPAIR  3570, 12/21/77   umbilical mesh placed in August 2015.  Marland Kitchen HERNIA REPAIR  02-05-15   bilateral inguinal - mesh  . MELANOMA EXCISION Right    '08- right cheek- no futher issues  . varicose vein Left 2016   past years bilateral  . WISDOM TOOTH EXTRACTION      There were no vitals filed for this visit.      Subjective Assessment - 02/19/17 1025    Subjective 10 min late, tentative with mvmt  and worried if dont get stronger t will go back out. need to order new TENS and doing some ex   Currently in Pain? Yes   Pain Score 6    Pain Location Back                         [facility] Adult PT Treatment/Exercise - 02/19/17 0001      Lumbar Exercises: Aerobic   Stationary Bike L 5 5 min     Lumbar Exercises: Standing   Other Standing Lumbar Exercises obl with green tband   Other Standing Lumbar Exercises 10# pulley scap stab with core activation     Lumbar Exercises: Supine   Ab Set 15 reps;3 seconds   Bridge 15 reps  8#   Straight Leg Raise 10 reps  red tband with abd   Other Supine Lumbar Exercises core stab 20 min     Moist Heat Therapy   Number Minutes Moist Heat 15 Minutes   Moist Heat Location Lumbar Spine     Electrical Stimulation   Electrical Stimulation Location LB/SI   Electrical Stimulation Action IFC   Electrical Stimulation Goals Pain     Manual Therapy   Muscle Energy Technique + SI alignment  PT Short Term Goals - 12/18/16 1215      PT SHORT TERM GOAL #1   Title Pt will report daily use of HEP.   Status Achieved           PT Long Term Goals - 02/19/17 1029      PT LONG TERM GOAL #5   Title tolerate return to  gardening with no more than a 2/10 pain     Additional Long Term Goals   Additional Long Term Goals Yes     PT LONG TERM GOAL #6   Title complaint with HEP for core/SI               Plan - 02/19/17 1106    Clinical Impression Statement PT to add new goals for core stab to enable pt to work in tard/garden without pain or flare up. Equal alignment but pain. focus session on core stab   PT Next Visit Plan Renewal      Patient will benefit from skilled therapeutic intervention in order to improve the following deficits and impairments:  Abnormal gait, Decreased endurance, Decreased strength, Difficulty walking, Postural dysfunction, Improper body mechanics, Pain, Increased muscle  spasms, Decreased range of motion  Visit Diagnosis: Chronic right-sided low back pain without sciatica  SI (sacroiliac) pain  Difficulty in walking, not elsewhere classified  Muscle spasm of back     Problem List Patient Active Problem List   Diagnosis Date Noted  . Gastroesophageal reflux disease without esophagitis 01/07/2016  . Insect sting allergy, current reaction 01/07/2016  . Rhinitis, allergic 08/25/2015  . Recurrent ventral hernia 05/31/2014    Arieanna Pressey,ANGIE  PTA 02/19/2017, 11:10 AM  [address] [place], [place], [address] Phone: [phone]   Fax:  [phone]  Name: Teresa Morales MRN: [id number] Date of Birth: Jun 09, 1941

## 2017-02-23 DIAGNOSIS — I8311 Varicose veins of right lower extremity with inflammation: Secondary | ICD-10-CM | POA: Diagnosis not present

## 2017-02-23 DIAGNOSIS — I83891 Varicose veins of right lower extremities with other complications: Secondary | ICD-10-CM | POA: Diagnosis not present

## 2017-02-24 ENCOUNTER — Ambulatory Visit: Payer: Medicare Other | Admitting: Physical Therapy

## 2017-02-24 ENCOUNTER — Encounter: Payer: Self-pay | Admitting: Physical Therapy

## 2017-02-24 ENCOUNTER — Ambulatory Visit (INDEPENDENT_AMBULATORY_CARE_PROVIDER_SITE_OTHER): Payer: Medicare Other | Admitting: *Deleted

## 2017-02-24 DIAGNOSIS — J309 Allergic rhinitis, unspecified: Secondary | ICD-10-CM | POA: Diagnosis not present

## 2017-02-24 DIAGNOSIS — R262 Difficulty in walking, not elsewhere classified: Secondary | ICD-10-CM

## 2017-02-24 DIAGNOSIS — G8929 Other chronic pain: Secondary | ICD-10-CM | POA: Diagnosis not present

## 2017-02-24 DIAGNOSIS — M533 Sacrococcygeal disorders, not elsewhere classified: Secondary | ICD-10-CM

## 2017-02-24 DIAGNOSIS — M545 Low back pain: Principal | ICD-10-CM

## 2017-02-24 DIAGNOSIS — M62830 Muscle spasm of back: Secondary | ICD-10-CM | POA: Diagnosis not present

## 2017-02-24 NOTE — Therapy (Signed)
[address] [place], [place], [address] Phone: [phone]   Fax:  [phone]  Physical Therapy Treatment  Patient Details  Name: Teresa Morales MRN: [id number] Date of Birth: 1941/03/04 Referring Provider: Delilah Shan  Encounter Date: 02/24/2017      PT End of Session - 02/24/17 1445    Visit Number 8   Date for PT Re-Evaluation 03/21/17   PT Start Time 1400   PT Stop Time 1500   PT Time Calculation (min) 60 min      Past Medical History:  Diagnosis Date  . Arthritis    left thumb and lower back,  . Atypical glandular cells of undetermined significance (AGUS) on cervical Pap smear 12/1998   endo polyp- [name]  . Disc degeneration, lumbar 2016   L4, L5, S1  . Endocervical polyp 1997  . Environmental allergies   . GERD (gastroesophageal reflux disease)   . Hypertension   . Interstitial cystitis 2004  . Melanoma of face ([place]) 04/2007  . Mild dysplasia of cervix    laser  . MRSA infection    many yrs ago -right hand(tx. with oral antibiotic)-no further issues  . Osteopenia    intolerant of bisphosphonates  . 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 Urethral stenosis    dilated    Past Surgical History:  Procedure Laterality Date  . COLONOSCOPY WITH PROPOFOL N/A 10/02/2015   Procedure: COLONOSCOPY WITH PROPOFOL;  Surgeon: Garlan Fair, MD;  Location: [facility];  Service: Endoscopy;  Laterality: N/A;  . HAND SURGERY     cyst removed from rt hand  . HERNIA REPAIR  0350, 0/9/38   umbilical mesh placed in August 2015.  Marland Kitchen HERNIA REPAIR  02-05-15   bilateral inguinal - mesh  . MELANOMA EXCISION Right    '08- right cheek- no futher issues  . varicose vein Left 2016   past years bilateral  . WISDOM TOOTH EXTRACTION      There were no vitals filed for this visit.      Subjective Assessment - 02/24/17 1408    Subjective core was sore but overall doing  better. i just need need to get on a good routine   Currently in Pain? Yes   Pain Score 3    Pain Location Back                         [facility] Adult PT Treatment/Exercise - 02/24/17 0001      Lumbar Exercises: Aerobic   Stationary Bike L 6 6 min     Lumbar Exercises: Machines for Strengthening   Leg Press 30# 2 sets 15  calf raises 30# 2 set s15     Lumbar Exercises: Standing   Other Standing Lumbar Exercises 10# pulley hip 4 way 15 reps  3# standing core cross over   Other Standing Lumbar Exercises 10# pulley scap stab with core activation  wt ball ext and obl standing     Lumbar Exercises: Supine   Other Supine Lumbar Exercises trunk ext and flex with green tband     Moist Heat Therapy   Number Minutes Moist Heat 15 Minutes   Moist Heat Location Lumbar Spine     Electrical Stimulation   Electrical Stimulation Location LB/SI   Electrical Stimulation Action IFC   Electrical Stimulation Goals Pain  achey  PT Short Term Goals - 12/18/16 1215      PT SHORT TERM GOAL #1   Title Pt will report daily use of HEP.   Status Achieved           PT Long Term Goals - 02/19/17 1029      PT LONG TERM GOAL #5   Title tolerate return to  gardening with no more than a 2/10 pain   Time 4   Period Weeks   Status New     Additional Long Term Goals   Additional Long Term Goals Yes     PT LONG TERM GOAL #6   Title complaint with HEP for core/SI   Time 4   Period Weeks   Status New               Plan - 02/24/17 1447    Clinical Impression Statement focus on core activation and stab with ex , verb and tactile cuing needed. no increased pain and equal SI alignment. Edcuated pt on need to be diligent at home with ex for core stab as to avoid flare ups.   PT Next Visit Plan D/C next 2-3 sessions      Patient will benefit from skilled therapeutic intervention in order to improve the following deficits and impairments:   Abnormal gait, Decreased endurance, Decreased strength, Difficulty walking, Postural dysfunction, Improper body mechanics, Pain, Increased muscle spasms, Decreased range of motion  Visit Diagnosis: Chronic right-sided low back pain without sciatica  SI (sacroiliac) pain  Difficulty in walking, not elsewhere classified  Muscle spasm of back     Problem List Patient Active Problem List   Diagnosis Date Noted  . Gastroesophageal reflux disease without esophagitis 01/07/2016  . Insect sting allergy, current reaction 01/07/2016  . Rhinitis, allergic 08/25/2015  . Recurrent ventral hernia 05/31/2014    Teresa Morales,Teresa Morales 02/24/2017, 2:51 PM  [address] [place], [place], [address] Phone: [phone]   Fax:  [phone]  Name: Teresa Morales MRN: [id number] Date of Birth: April 03, 1941

## 2017-02-26 ENCOUNTER — Ambulatory Visit (INDEPENDENT_AMBULATORY_CARE_PROVIDER_SITE_OTHER): Payer: Medicare Other | Admitting: Allergy & Immunology

## 2017-02-26 ENCOUNTER — Encounter: Payer: Self-pay | Admitting: Physical Therapy

## 2017-02-26 ENCOUNTER — Ambulatory Visit: Payer: Medicare Other | Admitting: Physical Therapy

## 2017-02-26 ENCOUNTER — Encounter: Payer: Self-pay | Admitting: Allergy & Immunology

## 2017-02-26 VITALS — BP 110/76 | HR 92 | Resp 16

## 2017-02-26 DIAGNOSIS — M545 Low back pain: Principal | ICD-10-CM

## 2017-02-26 DIAGNOSIS — M533 Sacrococcygeal disorders, not elsewhere classified: Secondary | ICD-10-CM | POA: Diagnosis not present

## 2017-02-26 DIAGNOSIS — M62830 Muscle spasm of back: Secondary | ICD-10-CM | POA: Diagnosis not present

## 2017-02-26 DIAGNOSIS — T63441D Toxic effect of venom of bees, accidental (unintentional), subsequent encounter: Secondary | ICD-10-CM

## 2017-02-26 DIAGNOSIS — R262 Difficulty in walking, not elsewhere classified: Secondary | ICD-10-CM

## 2017-02-26 DIAGNOSIS — G8929 Other chronic pain: Secondary | ICD-10-CM

## 2017-02-26 DIAGNOSIS — J3089 Other allergic rhinitis: Secondary | ICD-10-CM

## 2017-02-26 MED ORDER — DESLORATADINE 5 MG PO TABS: ORAL_TABLET | ORAL | 5 refills | Status: DC

## 2017-02-26 NOTE — Progress Notes (Signed)
FOLLOW UP  Date of Service/Encounter:  02/26/17   Assessment:   Chronic nonseasonal allergic rhinitis due to fungal spores  Toxic effect of venom of bees   Plan/Recommendations:   1. Toxic effect of venom of bees - Continue with venom immunotherapy once monthly.  - EpiPen is up to date.   2. Chronic nonseasonal allergic rhinitis - on immunotherapy 40+ years - Stop all of your current antihistamines. - Start Clarinex 1-2 two tablets daily. - We will send in a prescription for Clarinex.  - You can also try chlorpheniramine (available over the counter). - We will plan to stop allergy shots once we get your antihistamines onto a good regimen.  - We will stop the Dymista. - Start fluticasone one spray per nostril daily. - Restart azelastine two sprays per nostril 1-2 times daily.  - Try using nasal saline gel.   3. Return in about 2 months (around 04/28/2017).  Subjective:   Teresa Morales is a 77 y.o. female presenting today for follow up of  Chief Complaint  Patient presents with  . Allergic Rhinitis     PENDA VENTURI has a history of the following: Patient Active Problem List   Diagnosis Date Noted  . Gastroesophageal reflux disease without esophagitis 01/07/2016  . Insect sting allergy, current reaction 01/07/2016  . Rhinitis, allergic 08/25/2015  . Recurrent ventral hernia 05/31/2014    History obtained from: chart review and patient.  Kathryne Eriksson was referred by Henrine Screws, MD.     Teresa Morales is a 76 y.o. female presenting for a follow up visit. She was last seen in February 2018 at which time she was doing well. She has been on immunotherapy for years and we changed her from Xyzal to [place] one tablet daily. I also started on Karbial 62mL nightly. I also started her on Dystmia two sprays per nostril 1-2 times daily.   Since last visit, she has remained stable. At the last visit, we were discussing changing her antihistamines. From an extensive  review of her history, it seems that she changes her antihistamine every 3-5 years. She has not felt than any of the antihistamines worked recently. She did try using Allegra in the morning in conjunction with Karbinal at night. She thinks that it went well for a few days but then she developed breakthrough headaches. She then changed from Allegra to Claritin which helped for 10 days. Her headaches then return after 10 days. In retrospect, she feels that the [place] actually made her headaches worse, and it was going to be around $177 per month. She also noted some nausea with the use of Karbinal.   Overall, she is also wondering about how long she needs to continue on allergy shots. We did discuss the duration of her treatment at the last visit, I emphasized that the typical course is 3-5 years. She has not been on them for nearly 40 years. She restarted her allergy shots after her headaches returned. She has always felt that this bilateral frontal sinus pain has been related to allergies. She does note a worsening of this pain if she goes longer than a week on her allergy shots. But she is willing to give cessation of her allergy shots to try. Before she does this however, she would like to find an antihistamine regimen that helps control her symptoms. She has not tried Clarinex in quite some time and is interested in starting that again.  She is otherwise doing well. She  will be traveling with her husband to [place] for Easter to meet some of her family. Her husband will be running in a 10K while she sips coffee with her brother-in-law had a coffee shop in the middle of the route. She is looking forward to the trip. Her husband who is in the water business is planning to retire in the next few years. Otherwise, there have been no changes to her past medical history, surgical history, family history, or social history.    Review of Systems: a 14-point review of systems is pertinent for what is  mentioned in HPI.  Otherwise, all other systems were negative. Constitutional: negative other than that listed in the HPI Eyes: negative other than that listed in the HPI Ears, nose, mouth, throat, and face: negative other than that listed in the HPI Respiratory: negative other than that listed in the HPI Cardiovascular: negative other than that listed in the HPI Gastrointestinal: negative other than that listed in the HPI Genitourinary: negative other than that listed in the HPI Integument: negative other than that listed in the HPI Hematologic: negative other than that listed in the HPI Musculoskeletal: negative other than that listed in the HPI Neurological: negative other than that listed in the HPI Allergy/Immunologic: negative other than that listed in the HPI    Objective:   Blood pressure 110/76, pulse 92, resp. rate 16, last menstrual period 12/15/1993. There is no height or weight on file to calculate BMI.   Physical Exam:  General: Alert, interactive, in no acute distress. Very pleasant, talkative female. Eyes: No conjunctival injection present on the right, No conjunctival injection present on the left, PERRL bilaterally, No discharge on the right, No discharge on the left and No Horner-Trantas dots present Ears: Right TM pearly gray with normal light reflex, Left TM pearly gray with normal light reflex, Right TM intact without perforation and Left TM intact without perforation.  Nose/Throat: External nose within normal limits and septum midline, turbinates edematous and pale with clear discharge, post-pharynx erythematous without cobblestoning in the posterior oropharynx. Tonsils 2+ without exudates Neck: Supple without thyromegaly. Lungs: Clear to auscultation without wheezing, rhonchi or rales. No increased work of breathing. CV: Normal S1/S2, no murmurs. Capillary refill <2 seconds.  Skin: Warm and dry, without lesions or rashes. Neuro:   Grossly intact. No focal  deficits appreciated. Responsive to questions.   Diagnostic studies: none   Salvatore Marvel, MD [facility]

## 2017-02-26 NOTE — Therapy (Signed)
[address] [place], [place], [address] Phone: [phone]   Fax:  [phone]  Physical Therapy Treatment  Patient Details  Name: TRENELL MOXEY MRN: [id number] Date of Birth: [birth year] Referring Provider: Delilah Shan  Encounter Date: 02/26/2017      PT End of Session - 02/26/17 1411    Visit Number 9   Date for PT Re-Evaluation 03/21/17   PT Start Time 1400   PT Stop Time 1455   PT Time Calculation (min) 55 min      Past Medical History:  Diagnosis Date  . Arthritis    left thumb and lower back,  . Atypical glandular cells of undetermined significance (AGUS) on cervical Pap smear 12/1998   endo polyp- [name]  . Disc degeneration, lumbar 2016   L4, L5, S1  . Endocervical polyp 1997  . Environmental allergies   . GERD (gastroesophageal reflux disease)   . Hypertension   . Interstitial cystitis 2004  . Melanoma of face ([place]) 04/2007  . Mild dysplasia of cervix    laser  . MRSA infection    many yrs ago -right hand(tx. with oral antibiotic)-no further issues  . Osteopenia    intolerant of bisphosphonates  . 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 Urethral stenosis    dilated    Past Surgical History:  Procedure Laterality Date  . COLONOSCOPY WITH PROPOFOL N/A 10/02/2015   Procedure: COLONOSCOPY WITH PROPOFOL;  Surgeon: Garlan Fair, MD;  [facility];  Service: Endoscopy;  Laterality: N/A;  . HAND SURGERY     cyst removed from rt hand  . HERNIA REPAIR  8099, 07/17/37   umbilical mesh placed in August 2015.  Marland Kitchen HERNIA REPAIR  02-05-15   bilateral inguinal - mesh  . MELANOMA EXCISION Right    '08- right cheek- no futher issues  . varicose vein Left 2016   past years bilateral  . WISDOM TOOTH EXTRACTION      There were no vitals filed for this visit.      Subjective Assessment - 02/26/17 1410    Subjective doing okay, trying to walk more  and get back to ex   Currently in Pain? Yes                         [facility] Adult PT Treatment/Exercise - 02/26/17 0001      Lumbar Exercises: Aerobic   Elliptical 3 min   increased SI pain     Lumbar Exercises: Machines for Strengthening   Cybex Lumbar Extension black tband 15 times  and flexion   Other Lumbar Machine Exercise seated row and lats 25# 2 sets 15     Lumbar Exercises: Standing   Other Standing Lumbar Exercises wt ball OH ext and obl 15 each     Knee/Hip Exercises: Standing   Lateral Step Up Both;15 reps;Hand Hold: 2;Step Height: 6"  opp hip abd   Forward Step Up Both;15 reps;Hand Hold: 2;Step Height: 6"  opp hip ext   Wall Squat 10 reps;5 seconds  ball squeeze     Moist Heat Therapy   Number Minutes Moist Heat 15 Minutes   Moist Heat Location Lumbar Spine     Electrical Stimulation   Electrical Stimulation Location LB/SI   Electrical Stimulation Action IFC   Electrical Stimulation Goals Pain  PT Education - 02/26/17 1442    Education provided Yes   Education Details core stab and okayed general fitness classes   Person(s) Educated Patient   Methods Explanation   Comprehension Verbalized understanding          PT Short Term Goals - 12/18/16 1215      PT SHORT TERM GOAL #1   Title Pt will report daily use of HEP.   Status Achieved           PT Long Term Goals - 02/26/17 1443      PT LONG TERM GOAL #5   Title tolerate return to  gardening with no more than a 2/10 pain   Status On-going               Plan - 02/26/17 1444    Clinical Impression Statement progressing with goals . ex focus on core stab and activation with cuing and how to move from PT to independant gym   PT Next Visit Plan D/C next 2-3 sessions      Patient will benefit from skilled therapeutic intervention in order to improve the following deficits and impairments:  Abnormal gait, Decreased endurance, Decreased strength,  Difficulty walking, Postural dysfunction, Improper body mechanics, Pain, Increased muscle spasms, Decreased range of motion  Visit Diagnosis: Chronic right-sided low back pain without sciatica  SI (sacroiliac) pain  Difficulty in walking, not elsewhere classified  Muscle spasm of back     Problem List Patient Active Problem List   Diagnosis Date Noted  . Gastroesophageal reflux disease without esophagitis 01/07/2016  . Insect sting allergy, current reaction 01/07/2016  . Rhinitis, allergic 08/25/2015  . Recurrent ventral hernia 05/31/2014    Ameen Mostafa,ANGIE PTA 02/26/2017, 2:45 PM  [facility] [place], [place], [address] Phone: [phone]   Fax:  [phone]  Name: YANNELY KINTZEL MRN: [id number] Date of Birth: [birth year]

## 2017-02-26 NOTE — Patient Instructions (Addendum)
1. Toxic effect of venom of bees - Continue with venom immunotherapy once monthly.  - EpiPen is up to date.   2. Chronic nonseasonal allergic rhinitis - on immunotherapy 40+ years - Stop all of your current antihistamines. - Start Clarinex 1-2 two tablets daily. - We will send in a prescription for Clarinex.  - You can also try chlorpheniramine (available over the counter). - We will plan to stop allergy shots once we get your antihistamines onto a good regimen.  - We will stop the Dymista. - Start fluticasone one spray per nostril daily. - Restart azelastine two sprays per nostril 1-2 times daily.  - Try using nasal saline gel.   3. Return in about 2 months (around 04/28/2017).  Please inform us of any Emergency Department visits, hospitalizations, or changes in symptoms. Call us before going to the ED for breathing or allergy symptoms since we might be able to fit you in for a sick visit. Feel free to contact us anytime with any questions, problems, or concerns.  It was a pleasure to see you again today! Happy spring!   Websites that have reliable patient information: 1. American Academy of Asthma, Allergy, and Immunology: www.aaaai.org 2. Food Allergy Research and Education (FARE): foodallergy.org 3. Mothers of Asthmatics: http://www.asthmacommunitynetwork.org 4. American College of Allergy, Asthma, and Immunology: www.acaai.org

## 2017-03-05 DIAGNOSIS — J3089 Other allergic rhinitis: Secondary | ICD-10-CM | POA: Diagnosis not present

## 2017-03-06 ENCOUNTER — Ambulatory Visit (INDEPENDENT_AMBULATORY_CARE_PROVIDER_SITE_OTHER): Payer: Medicare Other

## 2017-03-06 ENCOUNTER — Encounter: Payer: Self-pay | Admitting: Physical Therapy

## 2017-03-06 ENCOUNTER — Ambulatory Visit: Payer: Medicare Other | Admitting: Physical Therapy

## 2017-03-06 DIAGNOSIS — G8929 Other chronic pain: Secondary | ICD-10-CM

## 2017-03-06 DIAGNOSIS — M545 Low back pain, unspecified: Secondary | ICD-10-CM

## 2017-03-06 DIAGNOSIS — R262 Difficulty in walking, not elsewhere classified: Secondary | ICD-10-CM | POA: Diagnosis not present

## 2017-03-06 DIAGNOSIS — J309 Allergic rhinitis, unspecified: Secondary | ICD-10-CM

## 2017-03-06 DIAGNOSIS — M533 Sacrococcygeal disorders, not elsewhere classified: Secondary | ICD-10-CM | POA: Diagnosis not present

## 2017-03-06 DIAGNOSIS — M62830 Muscle spasm of back: Secondary | ICD-10-CM

## 2017-03-06 NOTE — Therapy (Signed)
[address] [place], [place], [address] Phone: [phone]   Fax:  [phone]  Physical Therapy Treatment  Patient Details  Name: Teresa Morales MRN: [id number] Date of Birth: 1941/01/25 Referring Provider: Delilah Shan  Encounter Date: 03/06/2017      PT End of Session - 03/06/17 1028    Visit Number 10   Date for PT Re-Evaluation 03/21/17   PT Start Time 1025   PT Stop Time 1125   PT Time Calculation (min) 60 min      Past Medical History:  Diagnosis Date  . Arthritis    left thumb and lower back,  . Atypical glandular cells of undetermined significance (AGUS) on cervical Pap smear 12/1998   endo polyp- [name]  . Disc degeneration, lumbar 2016   L4, L5, S1  . Endocervical polyp 1997  . Environmental allergies   . GERD (gastroesophageal reflux disease)   . Hypertension   . Interstitial cystitis 2004  . Melanoma of face ([place]) 04/2007  . Mild dysplasia of cervix    laser  . MRSA infection    many yrs ago -right hand(tx. with oral antibiotic)-no further issues  . Osteopenia    intolerant of bisphosphonates  . 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 Urethral stenosis    dilated    Past Surgical History:  Procedure Laterality Date  . COLONOSCOPY WITH PROPOFOL N/A 10/02/2015   Procedure: COLONOSCOPY WITH PROPOFOL;  Surgeon: Garlan Fair, MD;  Location: [facility];  Service: Endoscopy;  Laterality: N/A;  . HAND SURGERY     cyst removed from rt hand  . HERNIA REPAIR  1324, 4/0/10   umbilical mesh placed in August 2015.  Marland Kitchen HERNIA REPAIR  02-05-15   bilateral inguinal - mesh  . MELANOMA EXCISION Right    '08- right cheek- no futher issues  . varicose vein Left 2016   past years bilateral  . WISDOM TOOTH EXTRACTION      There were no vitals filed for this visit.      Subjective Assessment - 03/06/17 1022    Subjective better than I was but still  tenative. no doing ex as much as I should-crazy week   Currently in Pain? Yes   Pain Score 2    Pain Location Back                         [facility] Adult PT Treatment/Exercise - 03/06/17 0001      Lumbar Exercises: Aerobic   Stationary Bike Nustep L 6 6 min     Lumbar Exercises: Machines for Strengthening   Cybex Lumbar Extension black tband 15 times   Other Lumbar Machine Exercise seated row and lats 25# 2 sets 15     Lumbar Exercises: Standing   Other Standing Lumbar Exercises wt ball OH ext and obl 15 each   Other Standing Lumbar Exercises 10# pulley scap stab with core activation     Moist Heat Therapy   Number Minutes Moist Heat 15 Minutes   Moist Heat Location Lumbar Spine     Traction   Type of Traction Lumbar   Max (lbs) 50   Time 15                  PT Short Term Goals - 12/18/16 1215      PT SHORT TERM GOAL #1   Title Pt will report  daily use of HEP.   Status Achieved           PT Long Term Goals - 03/06/17 1028      PT LONG TERM GOAL #1   Title Pt will report 50% decrease in pain.   Status Achieved     PT LONG TERM GOAL #2   Title Pt will understand proper squat mechanics without compensatory patterns.   Status Achieved     PT LONG TERM GOAL #3   Title Pt will increase bilateral hip strength to 5/5.    Status Achieved     PT LONG TERM GOAL #4   Title do yardwork and gardening without significant increase of pain   Status On-going               Plan - 03/06/17 1028    Clinical Impression Statement all goals met except gardening good, pt needs to increase HEP compliance. pt headed out of town for 1 week and afraid car ride with aggravate her. good and equal alignmnet today.    PT Next Visit Plan HOLD 2 weeks . pt to call when returns from trip      Patient will benefit from skilled therapeutic intervention in order to improve the following deficits and impairments:  Abnormal gait, Decreased endurance, Decreased  strength, Difficulty walking, Postural dysfunction, Improper body mechanics, Pain, Increased muscle spasms, Decreased range of motion  Visit Diagnosis: Chronic right-sided low back pain without sciatica  SI (sacroiliac) pain  Difficulty in walking, not elsewhere classified  Muscle spasm of back     Problem List Patient Active Problem List   Diagnosis Date Noted  . Gastroesophageal reflux disease without esophagitis 01/07/2016  . Insect sting allergy, current reaction 01/07/2016  . Rhinitis, allergic 08/25/2015  . Recurrent ventral hernia 05/31/2014    PAYSEUR,ANGIE PTA 03/06/2017, 10:53 AM  [address] [place], [place], [address] Phone: [phone]   Fax:  [phone]  Name: Teresa Morales MRN: [id number] Date of Birth: 10-20-1941

## 2017-03-09 DIAGNOSIS — H6123 Impacted cerumen, bilateral: Secondary | ICD-10-CM | POA: Diagnosis not present

## 2017-03-20 ENCOUNTER — Ambulatory Visit (INDEPENDENT_AMBULATORY_CARE_PROVIDER_SITE_OTHER): Payer: Medicare Other

## 2017-03-20 DIAGNOSIS — J309 Allergic rhinitis, unspecified: Secondary | ICD-10-CM

## 2017-03-23 ENCOUNTER — Other Ambulatory Visit: Payer: Self-pay | Admitting: Allergy and Immunology

## 2017-03-23 ENCOUNTER — Other Ambulatory Visit: Payer: Self-pay | Admitting: Obstetrics and Gynecology

## 2017-03-23 ENCOUNTER — Telehealth: Payer: Self-pay | Admitting: Obstetrics and Gynecology

## 2017-03-23 NOTE — Telephone Encounter (Signed)
Patient called stating she is due for a bone density this year. She is getting ready to schedule her mammogram and bone density at [facility] before she sees Dr. Quincy Simmonds for her AEX on 04/16/17. She may need an order for a bone density if indicated by the doctor.

## 2017-03-23 NOTE — Telephone Encounter (Signed)
I signed the order for the mammogram and BMD.

## 2017-03-23 NOTE — Telephone Encounter (Signed)
Order for BMD and screening mammogram to Dr.Silva for review and signature for faxing.

## 2017-03-24 NOTE — Telephone Encounter (Signed)
Order for screening mammogram and BMD faxed to [facility] with cover sheet and confirmation. Spoke with patient's husband Jeneen Rinks, okay per ROI. Advised order has been sent and patient can schedule at her convenience. Jeneen Rinks is agreeable.  Routing to provider for final review. Patient agreeable to disposition. Will close encounter.

## 2017-03-25 ENCOUNTER — Ambulatory Visit (INDEPENDENT_AMBULATORY_CARE_PROVIDER_SITE_OTHER): Payer: Medicare Other | Admitting: *Deleted

## 2017-03-25 DIAGNOSIS — T63441D Toxic effect of venom of bees, accidental (unintentional), subsequent encounter: Secondary | ICD-10-CM | POA: Diagnosis not present

## 2017-04-01 DIAGNOSIS — M8589 Other specified disorders of bone density and structure, multiple sites: Secondary | ICD-10-CM | POA: Diagnosis not present

## 2017-04-02 ENCOUNTER — Other Ambulatory Visit: Payer: Self-pay | Admitting: Obstetrics and Gynecology

## 2017-04-03 ENCOUNTER — Ambulatory Visit (INDEPENDENT_AMBULATORY_CARE_PROVIDER_SITE_OTHER): Payer: Medicare Other

## 2017-04-03 DIAGNOSIS — J309 Allergic rhinitis, unspecified: Secondary | ICD-10-CM

## 2017-04-03 NOTE — Telephone Encounter (Signed)
Medication refill request: Estradiol Last AEX:  04/09/16 BS Next AEX: 04/16/17 Last MMG (if hormonal medication request): 04/17/16 BIRADS 1 negative Refill authorized: 12/18/16 #42.5g w/0 refills; today refused; refill too soon

## 2017-04-07 ENCOUNTER — Other Ambulatory Visit: Payer: Self-pay | Admitting: Obstetrics and Gynecology

## 2017-04-07 ENCOUNTER — Telehealth: Payer: Self-pay | Admitting: Obstetrics and Gynecology

## 2017-04-07 NOTE — Telephone Encounter (Signed)
Medication refill request: Estradiol  Last AEX:  04-09-16  Next AEX: 04-16-17  Last MMG (if hormonal medication request): 04-17-16 WNL  Refill authorized: please advise   Sending to JJ since BS is off today

## 2017-04-07 NOTE — Telephone Encounter (Signed)
Patient is calling regarding her Estrace prescription that was denied. Patient has a couple of questions about this prescription.

## 2017-04-08 NOTE — Telephone Encounter (Signed)
Spoke with patient. Patient states she was calling to ask about refill for estrace cream, but pharmacy had called to notify that refill was ready for pick up. Patient asking if Dr. Quincy Simmonds has received BMD result from [facility] dated 04/01/17? Patient reports MMG scheduled for 04/21/17. Advised patient would check with Dr. Quincy Simmonds for BMD results and return call. Patient states she will be in for AEX on 04/16/17, can discuss results in detail at that visit if needed.  Dr. Quincy Simmonds- have you received BMD results?

## 2017-04-09 NOTE — Telephone Encounter (Signed)
I have received her BMD report showing osteopenia of both hips and normal bone density of the spine.  The hips look improved from the last test.  We can review in greater detail at her office visit.

## 2017-04-09 NOTE — Telephone Encounter (Signed)
Left message to call Manjot Beumer at [phone].  

## 2017-04-13 NOTE — Telephone Encounter (Signed)
Spoke with patient, advised as seen below per Dr. Silva. Patient verbalizes understanding and is agreeable.   Routing to provider for final review. Patient is agreeable to disposition. Will close encounter.  

## 2017-04-14 ENCOUNTER — Ambulatory Visit (INDEPENDENT_AMBULATORY_CARE_PROVIDER_SITE_OTHER): Payer: Medicare Other | Admitting: *Deleted

## 2017-04-14 DIAGNOSIS — J309 Allergic rhinitis, unspecified: Secondary | ICD-10-CM

## 2017-04-16 ENCOUNTER — Encounter: Payer: Self-pay | Admitting: Obstetrics and Gynecology

## 2017-04-16 ENCOUNTER — Ambulatory Visit (INDEPENDENT_AMBULATORY_CARE_PROVIDER_SITE_OTHER): Payer: Medicare Other | Admitting: Obstetrics and Gynecology

## 2017-04-16 VITALS — BP 164/80 | HR 70 | Resp 14 | Ht 63.0 in | Wt 121.8 lb

## 2017-04-16 DIAGNOSIS — M8580 Other specified disorders of bone density and structure, unspecified site: Secondary | ICD-10-CM | POA: Diagnosis not present

## 2017-04-16 DIAGNOSIS — Z01419 Encounter for gynecological examination (general) (routine) without abnormal findings: Secondary | ICD-10-CM

## 2017-04-16 MED ORDER — ESTRADIOL 0.1 MG/GM VA CREA: TOPICAL_CREAM | VAGINAL | 1 refills | Status: DC

## 2017-04-16 NOTE — Patient Instructions (Signed)

## 2017-04-16 NOTE — Progress Notes (Signed)
76 y.o. G76P1001 Married Caucasian female here for annual exam.    Has osteopenia of the hips.  Intolerant to bisphosphonates.  Has Rx for vaginal Estrace cream, using 1/2 gram 3 times per week. Using coconut oil.  Has DJD of hips. Doing physical therapy.  PCP:  R.Mertha Finders, MD  Patient's last menstrual period was 12/15/1993 (approximate).           Sexually active: Yes.   husband The current method of family planning is post menopausal status.    Exercising: Yes.     Smoker:  no  Health Maintenance: Pap: 04-09-16 Neg; 02-16-14 Neg History of abnormal Pap:  Yes,  6/89 Laser vaporization of cervix for mild dysplasia. MMG: 04-17-16 3D Diag.Bil.Density C/benign scattered calcifications in Rt.breast/Neg/BiRads2/screening 40yr:[facility] Colonoscopy: 09/2015 normal Dr. Bonnita Nasuti more needed due to age.  BMD: 04-01-17  Result: Osteopenia of both hips and normal bone density of spine:[facility] TDaP:  PCP Gardasil:   no HIV:no  Hep C: no Screening Labs:  Hb today: PCP, Urine today: not done   reports that she has never smoked. She has never used smokeless tobacco. She reports that she does not drink alcohol or use drugs.  Past Medical History:  Diagnosis Date  . Arthritis    left thumb and lower back,  . Atypical glandular cells of undetermined significance (AGUS) on cervical Pap smear 12/1998   endo polyp- [name]  . Disc degeneration, lumbar 2016   L4, L5, S1  . Endocervical polyp 1997  . Environmental allergies   . GERD (gastroesophageal reflux disease)   . Hypertension   . Interstitial cystitis 2004  . Melanoma of face ([place]) 04/2007  . Mild dysplasia of cervix    laser  . MRSA infection    many yrs ago -right hand(tx. with oral antibiotic)-no further issues  . Osteopenia    intolerant of bisphosphonates  . 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 Urethral stenosis    dilated    Past Surgical History:  Procedure Laterality Date  .  COLONOSCOPY WITH PROPOFOL N/A 10/02/2015   Procedure: COLONOSCOPY WITH PROPOFOL;  Surgeon: Garlan Fair, MD;  [facility]: [facility];  Service: Endoscopy;  Laterality: N/A;  . HAND SURGERY     cyst removed from rt hand  . HERNIA REPAIR  8546, 01/21/02   umbilical mesh placed in August 2015.  Marland Kitchen HERNIA REPAIR  02-05-15   bilateral inguinal - mesh  . MELANOMA EXCISION Right    '08- right cheek- no futher issues  . varicose vein Left 2016   past years bilateral  . WISDOM TOOTH EXTRACTION      Current Outpatient Prescriptions  Medication Sig Dispense Refill  . azelastine (ASTELIN) 0.1 % nasal spray INSTILL 2 SPRAYS INTO BOTH NOSTRILS TWICE DAILY AS DIRECTED 30 mL 5  . Calcium Citrate-Vitamin D (CITRACAL + D PO) Take 2 tablets by mouth 2 (two) times daily.     Marland Kitchen CHERRY PO Take 4 tablets by mouth daily. Tart Cherry Extract    . cholecalciferol (VITAMIN D) 1000 UNITS tablet Take 1,000 Units by mouth daily.    Marland Kitchen desloratadine (CLARINEX) 5 MG tablet 1-2 tablets daily 60 tablet 5  . EPINEPHrine (EPIPEN 2-PAK) 0.3 mg/0.3 mL IJ SOAJ injection Inject 0.3 mLs (0.3 mg total) into the muscle once. 2 Device 2  . estradiol (ESTRACE) 0.1 MG/GM vaginal cream USE 1/2 GRAMS VAGINALLY 2- 3 TIMES PER WEEK 42.5 g 0  . Eszopiclone (ESZOPICLONE) 3 MG  TABS Take 3 mg by mouth as needed (for sleep). Take immediately before bedtime    . Lactobacillus Rhamnosus, GG, (CULTURELLE PO) Take 1 tablet by mouth daily.     . Misc Natural Products (OSTEO BI-FLEX JOINT SHIELD PO) Take 2 tablets by mouth daily.     . montelukast (SINGULAIR) 10 MG tablet TAKE 1 TABLET BY MOUTH EVERY EVENING TO PREVENT COUGH OR WHEEZE 90 tablet 0  . Multiple Vitamin (MULTIVITAMIN) capsule Take 1 capsule by mouth daily.    Marland Kitchen nystatin-triamcinolone (MYCOLOG II) cream Apply 1 application topically 2 (two) times daily. Apply to affected area BID for up to 5 days (Patient taking differently: Apply 1 application topically 2 (two) times daily as needed.  Apply to affected area BID for up to 5 days) 60 g 0  . OMEGA 3 1000 MG CAPS Take 1 capsule by mouth daily.     Marland Kitchen omeprazole (PRILOSEC) 20 MG capsule Take 20 mg by mouth 2 (two) times daily.     Marland Kitchen OVER THE COUNTER MEDICATION Coconut oil -Takes 4 days a week vaginally and externally(uses 1gm(?) on the days she doesn't use Estrace cream    . PRESCRIPTION MEDICATION ALLERGY INJECTION  Takes every other week    . Triamcinolone Acetonide (NASACORT AQ NA) Place 1 spray into the nose 2 (two) times daily.    Marland Kitchen triamcinolone cream (KENALOG) 0.1 % Apply 1 application topically as needed (for rash).     . triamterene-hydrochlorothiazide (MAXZIDE-25) 37.5-25 MG per tablet Take 0.5 tablets by mouth daily.    Marland Kitchen trolamine salicylate (ASPERCREME) 10 % cream Apply 1 application topically as needed for muscle pain.    . valsartan (DIOVAN) 160 MG tablet TK 1 T PO QD IN THE EVE  5   No current facility-administered medications for this visit.     Family History  Problem Relation Age of Onset  . Other Mother 78    MVA   . Lupus Sister 21  . Hypertension Father   . Osteoporosis Sister   . Thyroid disease Sister   . Scoliosis Sister   . Prostate cancer Brother   . Allergic rhinitis Neg Hx   . Angioedema Neg Hx   . Asthma Neg Hx   . Eczema Neg Hx   . Immunodeficiency Neg Hx   . Urticaria Neg Hx     ROS:  Pertinent items are noted in HPI.  Otherwise, a comprehensive ROS was negative.  Exam:   BP (!) 164/80 (BP Location: Right Arm, Patient Position: Sitting, Cuff Size: Normal)   Pulse 70   Resp 14   Ht 5\' 3"  (1.6 m)   Wt 121 lb 12.8 oz (55.2 kg)   LMP 12/15/1993 (Approximate)   BMI 21.58 kg/m     General appearance: alert, cooperative and appears stated age Head: Normocephalic, without obvious abnormality, atraumatic Neck: no adenopathy, supple, symmetrical, trachea midline and thyroid normal to inspection and palpation Lungs: clear to auscultation bilaterally Breasts: normal appearance, no  masses or tenderness, No nipple retraction or dimpling, No nipple discharge or bleeding, No axillary or supraclavicular adenopathy Heart: regular rate and rhythm Abdomen: soft, non-tender; no masses, no organomegaly Extremities: extremities normal, atraumatic, no cyanosis or edema Skin: Skin color, texture, turgor normal. No rashes or lesions Lymph nodes: Cervical, supraclavicular, and axillary nodes normal. No abnormal inguinal nodes palpated Neurologic: Grossly normal  Pelvic: External genitalia:  no lesions              Urethra:  normal  appearing urethra with no masses, tenderness or lesions              Bartholins and Skenes: normal                 Vagina: normal appearing vagina with normal color and discharge, no lesions              Cervix: no lesions              Pap taken: No. Bimanual Exam:  Uterus:  normal size, contour, position, consistency, mobility, non-tender              Adnexa: no mass, fullness, tenderness              Rectal exam: Yes.  Hemorrhoids.  Confirms.              Anus:  normal sphincter tone, no lesions  Chaperone was present for exam.  Assessment:   Well woman visit with normal exam. Osteopenia.  Improved.  Remote hx of abnormal paps. Atrophy.  Plan: Mammogram screening discussed.  Has appt.  Recommended self breast awareness. Pap and HR HPV as above. Guidelines for Calcium, Vitamin D, regular exercise program including cardiovascular and weight bearing exercise. BMD in 2 years.  Ok to continue vaginal estrace cream. Rx given.  Discussed potential increased risk of breast cancer. Copy of her labs from her PCP will be scanned in. Follow up annually and prn.      After visit summary provided.

## 2017-04-21 ENCOUNTER — Encounter: Payer: Self-pay | Admitting: Obstetrics and Gynecology

## 2017-04-23 ENCOUNTER — Ambulatory Visit (INDEPENDENT_AMBULATORY_CARE_PROVIDER_SITE_OTHER): Payer: Medicare Other | Admitting: Obstetrics and Gynecology

## 2017-04-23 ENCOUNTER — Ambulatory Visit (INDEPENDENT_AMBULATORY_CARE_PROVIDER_SITE_OTHER): Payer: Medicare Other | Admitting: *Deleted

## 2017-04-23 ENCOUNTER — Encounter: Payer: Self-pay | Admitting: Obstetrics and Gynecology

## 2017-04-23 VITALS — BP 158/82 | HR 80 | Resp 16 | Wt 121.0 lb

## 2017-04-23 DIAGNOSIS — J309 Allergic rhinitis, unspecified: Secondary | ICD-10-CM | POA: Diagnosis not present

## 2017-04-23 DIAGNOSIS — N760 Acute vaginitis: Secondary | ICD-10-CM | POA: Diagnosis not present

## 2017-04-23 MED ORDER — LIDOCAINE 5 % EX OINT: 1.0000 "application " | TOPICAL_OINTMENT | Freq: Four times a day (QID) | CUTANEOUS | 0 refills | Status: AC | PRN

## 2017-04-23 NOTE — Progress Notes (Signed)
GYNECOLOGY  VISIT   HPI: 76 y.o.   Married  Caucasian  female   G1P1001 with Patient's last menstrual period was 12/15/1993 (approximate).   here c/o vaginal burning and irritation X 5 days.  She uses estrace cream a 3 x a week and coconut oil on the days she's not using the estrace. Vaginal dryness has been helped with the regimen. If she skips a day she feels.  No recent skipped days. In the past she had frequent bacterial infection. No increased d/c or odor. No itching.  She is sexually active, not frequently, not recently.  She has a h/o interstitial cystitis, told it was mild. Not currently bothering her.   Just occasional stress incontinence.   GYNECOLOGIC HISTORY: Patient's last menstrual period was 12/15/1993 (approximate). Contraception:postmenopause  Menopausal hormone therapy: none         OB History    Gravida Para Term Preterm AB Living   1 1 1     1    SAB TAB Ectopic Multiple Live Births                     Patient Active Problem List   Diagnosis Date Noted  . Gastroesophageal reflux disease without esophagitis 01/07/2016  . Insect sting allergy, current reaction 01/07/2016  . Rhinitis, allergic 08/25/2015  . Recurrent ventral hernia 05/31/2014    Past Medical History:  Diagnosis Date  . Arthritis    left thumb and lower back,  . Atypical glandular cells of undetermined significance (AGUS) on cervical Pap smear 12/1998   endo polyp- [name]  . Disc degeneration, lumbar 2016   L4, L5, S1  . Endocervical polyp 1997  . Environmental allergies   . GERD (gastroesophageal reflux disease)   . Hypertension   . Interstitial cystitis 2004  . Melanoma of face ([place]) 04/2007  . Mild dysplasia of cervix    laser  . MRSA infection    many yrs ago -right hand(tx. with oral antibiotic)-no further issues  . Osteopenia    intolerant of bisphosphonates  . 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 Urethral stenosis    dilated    Past  Surgical History:  Procedure Laterality Date  . COLONOSCOPY WITH PROPOFOL N/A 10/02/2015   Procedure: COLONOSCOPY WITH PROPOFOL;  Surgeon: Garlan Fair, MD;  [facility];  Service: Endoscopy;  Laterality: N/A;  . HAND SURGERY     cyst removed from rt hand  . HERNIA REPAIR  8413, 01/18/39   umbilical mesh placed in August 2015.  Marland Kitchen HERNIA REPAIR  02-05-15   bilateral inguinal - mesh  . MELANOMA EXCISION Right    '08- right cheek- no futher issues  . varicose vein Left 2016   past years bilateral  . WISDOM TOOTH EXTRACTION      Current Outpatient Prescriptions  Medication Sig Dispense Refill  . azelastine (ASTELIN) 0.1 % nasal spray INSTILL 2 SPRAYS INTO BOTH NOSTRILS TWICE DAILY AS DIRECTED 30 mL 5  . Calcium Citrate-Vitamin D (CITRACAL + D PO) Take 2 tablets by mouth 2 (two) times daily.     Marland Kitchen CHERRY PO Take 4 tablets by mouth daily. Tart Cherry Extract    . cholecalciferol (VITAMIN D) 1000 UNITS tablet Take 1,000 Units by mouth daily.    Marland Kitchen desloratadine (CLARINEX) 5 MG tablet 1-2 tablets daily 60 tablet 5  . EPINEPHrine (EPIPEN 2-PAK) 0.3 mg/0.3 mL IJ SOAJ injection Inject 0.3 mLs (0.3 mg total) into the muscle  once. 2 Device 2  . estradiol (ESTRACE) 0.1 MG/GM vaginal cream USE 1/2 GRAMS VAGINALLY 2- 3 TIMES PER WEEK 30 g 1  . Eszopiclone (ESZOPICLONE) 3 MG TABS Take 3 mg by mouth as needed (for sleep). Take immediately before bedtime    . Lactobacillus Rhamnosus, GG, (CULTURELLE PO) Take 1 tablet by mouth daily.     . Misc Natural Products (OSTEO BI-FLEX JOINT SHIELD PO) Take 2 tablets by mouth daily.     . montelukast (SINGULAIR) 10 MG tablet TAKE 1 TABLET BY MOUTH EVERY EVENING TO PREVENT COUGH OR WHEEZE 90 tablet 0  . Multiple Vitamin (MULTIVITAMIN) capsule Take 1 capsule by mouth daily.    Marland Kitchen nystatin-triamcinolone (MYCOLOG II) cream Apply 1 application topically 2 (two) times daily. Apply to affected area BID for up to 5 days (Patient taking differently: Apply 1  application topically 2 (two) times daily as needed. Apply to affected area BID for up to 5 days) 60 g 0  . OMEGA 3 1000 MG CAPS Take 1 capsule by mouth daily.     Marland Kitchen omeprazole (PRILOSEC) 20 MG capsule Take 20 mg by mouth 2 (two) times daily.     Marland Kitchen OVER THE COUNTER MEDICATION Coconut oil -Takes 4 days a week vaginally and externally(uses 1gm(?) on the days she doesn't use Estrace cream    . PRESCRIPTION MEDICATION ALLERGY INJECTION  Takes every other week    . Triamcinolone Acetonide (NASACORT AQ NA) Place 1 spray into the nose 2 (two) times daily.    Marland Kitchen triamcinolone cream (KENALOG) 0.1 % Apply 1 application topically as needed (for rash).     . triamterene-hydrochlorothiazide (MAXZIDE-25) 37.5-25 MG per tablet Take 0.5 tablets by mouth daily.    Marland Kitchen trolamine salicylate (ASPERCREME) 10 % cream Apply 1 application topically as needed for muscle pain.    . valsartan (DIOVAN) 160 MG tablet TK 1 T PO QD IN THE EVE  5   No current facility-administered medications for this visit.      ALLERGIES: Macrodantin [nitrofurantoin]; Bee venom; Darvocet [propoxyphene n-acetaminophen]; Endocet [oxycodone-acetaminophen]; Floxin [ofloxacin]; Latex; Penetrex [enoxacin]; and Sulfa antibiotics  Family History  Problem Relation Age of Onset  . Other Mother 54       MVA   . Lupus Sister 68  . Hypertension Father   . Osteoporosis Sister   . Thyroid disease Sister   . Scoliosis Sister   . Prostate cancer Brother   . Allergic rhinitis Neg Hx   . Angioedema Neg Hx   . Asthma Neg Hx   . Eczema Neg Hx   . Immunodeficiency Neg Hx   . Urticaria Neg Hx     Social History   Social History  . Marital status: Married    Spouse name: N/A  . Number of children: N/A  . Years of education: N/A   Occupational History  . Not on file.   Social History Main Topics  . Smoking status: Never Smoker  . Smokeless tobacco: Never Used  . Alcohol use No  . Drug use: No  . Sexual activity: Yes    Partners: Male     Birth control/ protection: Post-menopausal     Comment: husband vasectomy   Other Topics Concern  . Not on file   Social History Narrative  . No narrative on file    Review of Systems  Constitutional: Negative.   HENT: Positive for congestion and sore throat.   Eyes: Negative.   Respiratory: Negative.   Cardiovascular: Negative.  Gastrointestinal: Negative.   Genitourinary:       Vaginal itching and burning   Musculoskeletal: Negative.   Skin: Negative.   Neurological: Negative.   Endo/Heme/Allergies: Negative.   Psychiatric/Behavioral: Negative.     PHYSICAL EXAMINATION:    BP (!) 158/82 (BP Location: Right Arm, Patient Position: Sitting, Cuff Size: Normal)   Pulse 80   Resp 16   Wt 121 lb (54.9 kg)   LMP 12/15/1993 (Approximate)   BMI 21.43 kg/m     General appearance: alert, cooperative and appears stated age  Pelvic: External genitalia:  no lesions, no erythema. She is tender with palpation around the vestibule.               Urethra:  normal appearing urethra with no masses, tenderness or lesions              Bartholins and Skenes: normal                 Vagina: normal appearing vagina with normal color and discharge, no lesions              Cervix: no lesions  Chaperone was present for exam.  Wet prep: ? Clue vs artifact, no trich, few wbc KOH: no yeast PH: 4.5   ASSESSMENT Vulvar/vaginal burning and irritation. No findings on exam. Vaginal slides with possible BV    PLAN Wet prep probe Lidocaine ointment for discomfort Will treat depending on results from the affirm (if BV, she would like metrogel) Continue with her vaginal estrogen, coconut oil, can also use vaseline externally as needed   An After Visit Summary was printed and given to the patient.  15 minutes face to face time of which over 50% was spent in counseling.   CC: Dr Quincy Simmonds

## 2017-04-24 ENCOUNTER — Telehealth: Payer: Self-pay

## 2017-04-24 LAB — WET PREP BY MOLECULAR PROBE
CANDIDA SPECIES: DETECTED — AB
Gardnerella vaginalis: DETECTED — AB
TRICHOMONAS VAG: NOT DETECTED

## 2017-04-24 MED ORDER — METRONIDAZOLE 0.75 % VA GEL: 1.0000 | Freq: Every day | VAGINAL | 0 refills | Status: DC

## 2017-04-24 MED ORDER — FLUCONAZOLE 150 MG PO TABS: ORAL_TABLET | ORAL | 0 refills | Status: DC

## 2017-04-24 NOTE — Telephone Encounter (Signed)
Spoke with patient's husband Jeneen Rinks, okay per ROI. Jeneen Rinks states the patient is at home at this time and can be reached to discuss results.  Spoke with patient. Advised of affirm testing showing positive for BV and yeast. Patient verbalizes understanding. Requests rx be sent to the pharmacy on file. Paper rx shredded. Rx for Metrogel and Diflucan sent to pharmacy on file.  Cc: Dr.Jertson  Routing to covering provider for final review. Patient agreeable to disposition. Will close encounter.

## 2017-04-24 NOTE — Telephone Encounter (Signed)
-----   Message from Salvadore Dom, MD sent at 04/23/2017  2:03 PM EDT ----- Please check her affirm in the am. If + for BV she would like metrogel. Thank you!!!! Sharee Pimple

## 2017-04-24 NOTE — Telephone Encounter (Signed)
Spoke with patient's husband Jeneen Rinks, okay per ROI. Per Jeneen Rinks the patient is unavailable at this time. James requests to stop by the office this afternoon to discuss results and pick up rx as he is having trouble with his home phone. Rx's printed and will discuss with Jeneen Rinks upon arrival to the office.  Dr.Silva, patient's affirm testing was positive for BV and yeast. I have printed two prescriptions. Metrogel placed 1 applicator vaginally daily at night x 5 days. Diflucan 150 mg take 1 tablet po once, repeat after completion of Metrogel #2 0RF. Do you agree with recommendations?

## 2017-04-24 NOTE — Telephone Encounter (Signed)
I agree with this treatment plan

## 2017-04-27 ENCOUNTER — Telehealth: Payer: Self-pay | Admitting: *Deleted

## 2017-04-27 NOTE — Telephone Encounter (Signed)
Spoke with patient and gave results. Patient states she is feeling a little better, the burning is gone but the irritation is still there. She is going to give it some more time and call back if symptoms persist.    Sending to [place] for review

## 2017-04-27 NOTE — Telephone Encounter (Signed)
Patient returning your call.

## 2017-04-27 NOTE — Telephone Encounter (Signed)
Left message to call regarding results -eh 

## 2017-04-27 NOTE — Telephone Encounter (Signed)
-----   Message from Salvadore Dom, MD sent at 04/27/2017  8:26 AM EDT ----- The patient was treated last week with metrogel and diflucan. Please call and check on her and see if she is starting to feel better.

## 2017-04-28 ENCOUNTER — Other Ambulatory Visit: Payer: Self-pay | Admitting: Pediatrics

## 2017-04-29 DIAGNOSIS — Z1231 Encounter for screening mammogram for malignant neoplasm of breast: Secondary | ICD-10-CM | POA: Diagnosis not present

## 2017-04-29 DIAGNOSIS — L570 Actinic keratosis: Secondary | ICD-10-CM | POA: Diagnosis not present

## 2017-04-29 DIAGNOSIS — L821 Other seborrheic keratosis: Secondary | ICD-10-CM | POA: Diagnosis not present

## 2017-04-29 DIAGNOSIS — D1801 Hemangioma of skin and subcutaneous tissue: Secondary | ICD-10-CM | POA: Diagnosis not present

## 2017-04-29 DIAGNOSIS — L720 Epidermal cyst: Secondary | ICD-10-CM | POA: Diagnosis not present

## 2017-04-29 DIAGNOSIS — Z85828 Personal history of other malignant neoplasm of skin: Secondary | ICD-10-CM | POA: Diagnosis not present

## 2017-04-29 DIAGNOSIS — L814 Other melanin hyperpigmentation: Secondary | ICD-10-CM | POA: Diagnosis not present

## 2017-04-29 DIAGNOSIS — D485 Neoplasm of uncertain behavior of skin: Secondary | ICD-10-CM | POA: Diagnosis not present

## 2017-04-29 DIAGNOSIS — Z85820 Personal history of malignant melanoma of skin: Secondary | ICD-10-CM | POA: Diagnosis not present

## 2017-04-30 ENCOUNTER — Ambulatory Visit (INDEPENDENT_AMBULATORY_CARE_PROVIDER_SITE_OTHER): Payer: Medicare Other | Admitting: Allergy & Immunology

## 2017-04-30 ENCOUNTER — Encounter: Payer: Self-pay | Admitting: Allergy & Immunology

## 2017-04-30 ENCOUNTER — Ambulatory Visit: Payer: Self-pay

## 2017-04-30 VITALS — BP 130/80 | HR 82 | Temp 98.1°F | Resp 16

## 2017-04-30 DIAGNOSIS — J309 Allergic rhinitis, unspecified: Secondary | ICD-10-CM

## 2017-04-30 DIAGNOSIS — J3089 Other allergic rhinitis: Secondary | ICD-10-CM | POA: Diagnosis not present

## 2017-04-30 DIAGNOSIS — K219 Gastro-esophageal reflux disease without esophagitis: Secondary | ICD-10-CM | POA: Diagnosis not present

## 2017-04-30 DIAGNOSIS — T63441D Toxic effect of venom of bees, accidental (unintentional), subsequent encounter: Secondary | ICD-10-CM | POA: Diagnosis not present

## 2017-04-30 NOTE — Progress Notes (Signed)
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 Toxic effect of venom of bees - Continue with venom immunotherapy once monthly.  - We can now switch to every six weeks injections for two years before changing to every 8 week injections thereafter.  - EpiPen is up to date.   2. 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 - You can take an extra dose of this or a first generation antihistamine if you need breakthrough symptoms.  - We will give you a sample of Ryvent, a new first generation antihistamine, to see if this might help you. - Copay card and prescription provided.   3. Upper respiratory infection - Continue with your extensive nasal saline rinses. - Call me over the weekend if this does not improve and we can send in antibiotics. - My work cell is [phone].  4. Return in about 6 months (around 10/31/2017).  Subjective:   Teresa Morales is a 76 y.o. female presenting today for follow up of  Chief Complaint  Patient presents with  . Nasal Congestion    patient stated that it's in her chest  . Headache    Teresa Morales has a history of the following: Patient Active Problem List   Diagnosis Date Noted  . Gastroesophageal reflux disease without esophagitis 01/07/2016  . Insect sting allergy, current reaction 01/07/2016  . Rhinitis, allergic 08/25/2015  . Recurrent ventral hernia 05/31/2014    History obtained from: chart review and patient.  Teresa Morales was referred by Josetta Huddle, MD.     Teresa Morales is a 76 y.o. female presenting for a follow up visit. She was last seen in March 2018, at which time he had a very circular and redundant conversation about the multitude of  antihistamines that she has tried over the decades. At the last visit, she was doing well with Allegra in the morning and Karbinal at night. This seemed to be doing well, but then she developed breakthrough headaches. She was also paying nearly $200 per month for the [place]. She also noted some nausea with the use of the [place]. At the last visit, we recommended stopping all of her antihistamines and starting Clarinex one to 2 tablets daily. We did send in a prescription for this. We also recommended use of chlorpheniramine as needed for breakthrough symptoms. We discussed stopping allergy shots, but she has remained hesitant to this. I recommended restarting her nasal sprays including fluticasone as well as azelastine.   Since last visit, she has generally done well. She is currently on Xyzal daily. She was on Clarinex after the last visit, but found that it was less effective after 2 weeks. She did try Flonase for 2-3 weeks after the last visit, but then change back to Nasacort. She remains on her Netti pot, which she uses at least twice daily. Her allergy shots are going well. She is almost up to 0.5 mL of her Red Vial. She also remains on her venom immunotherapy, which she receives monthly. Her headaches have mostly been under good control, however over the last 12-14 days she has had increased congestion and sinus pressure. It first started with a sore throat as well as vaginal discharge. She was diagnosed with  a candidal yeast infection and treated. These symptoms have improved. However, her sinus congestion and tenderness has worsened. She continues to cough, initially productive of yellow sputum but then changed to clear sputum. She denies fevers, but does endorse chills and an aching feeling.   Otherwise, there have been no changes to her past medical history, surgical history, family history, or social history.    Review of Systems: a 14-point review of systems is pertinent for what is  mentioned in HPI.  Otherwise, all other systems were negative. Constitutional: negative other than that listed in the HPI Eyes: negative other than that listed in the HPI Ears, nose, mouth, throat, and face: negative other than that listed in the HPI Respiratory: negative other than that listed in the HPI Cardiovascular: negative other than that listed in the HPI Gastrointestinal: negative other than that listed in the HPI Genitourinary: negative other than that listed in the HPI Integument: negative other than that listed in the HPI Hematologic: negative other than that listed in the HPI Musculoskeletal: negative other than that listed in the HPI Neurological: negative other than that listed in the HPI Allergy/Immunologic: negative other than that listed in the HPI    Objective:   Blood pressure 130/80, pulse 82, temperature 98.1 F (36.7 C), temperature source Oral, resp. rate 16, last menstrual period 12/15/1993, SpO2 95 %. There is no height or weight on file to calculate BMI.   Physical Exam:  General: Alert, interactive, in no acute distress. Very pleasant, extremely talkative female. Eyes: No conjunctival injection present on the right, No conjunctival injection present on the left, PERRL bilaterally, No discharge on the right, No discharge on the left and No Horner-Trantas dots present Ears: Right TM pearly gray with normal light reflex, Left TM pearly gray with normal light reflex, Right TM intact without perforation and Left TM intact without perforation.   Nose/Throat: External nose within normal limits and septum midline, turbinates edematous and pale with clear to thick discharge, post-pharynx erythematous with marked cobblestoning in the posterior oropharynx. Tonsils 2+ without exudates Neck:   Supple without thyromegaly. Lungs:            Clear to auscultation without wheezing, rhonchi or rales. No increased work of breathing. There are rare coarse upper airway noises  throughout. CV:      Normal S1/S2, no murmurs. Capillary refill <2 seconds.  Skin:    Warm and dry, without lesions or rashes. Neuro:   Grossly intact. No focal deficits appreciated. Responsive to questions.   Diagnostic studies: none    Salvatore Marvel, MD [facility] [place]

## 2017-04-30 NOTE — Patient Instructions (Addendum)
1. Toxic effect of venom of bees - Continue with venom immunotherapy once monthly.  - EpiPen is up to date.   2. 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 - You can take an extra dose of this or a first generation antihistamine if you need breakthrough symptoms.  - We will give you a sample of Ryvent, a new first generation antihistamine, to see if this might help you.  3. Upper respiratory infection - Continue with your extensive nasal saline rinses. - Call me over the weekend if this does not improve and we can send in antibiotics. - My work cell is [phone].  4. Return in about 6 months (around 10/31/2017).  Please inform us of any Emergency Department visits, hospitalizations, or changes in symptoms. Call us before going to the ED for breathing or allergy symptoms since we might be able to fit you in for a sick visit. Feel free to contact us anytime with any questions, problems, or concerns.  It was a pleasure to see you again today! Happy spring!   Websites that have reliable patient information: 1. American Academy of Asthma, Allergy, and Immunology: www.aaaai.org 2. Food Allergy Research and Education (FARE): foodallergy.org 3. Mothers of Asthmatics: http://www.asthmacommunitynetwork.org 4. American College of Allergy, Asthma, and Immunology: www.acaai.org

## 2017-05-06 ENCOUNTER — Ambulatory Visit (INDEPENDENT_AMBULATORY_CARE_PROVIDER_SITE_OTHER): Payer: Medicare Other | Admitting: *Deleted

## 2017-05-06 DIAGNOSIS — T63441D Toxic effect of venom of bees, accidental (unintentional), subsequent encounter: Secondary | ICD-10-CM | POA: Diagnosis not present

## 2017-05-08 ENCOUNTER — Ambulatory Visit (INDEPENDENT_AMBULATORY_CARE_PROVIDER_SITE_OTHER): Payer: Medicare Other

## 2017-05-08 DIAGNOSIS — J309 Allergic rhinitis, unspecified: Secondary | ICD-10-CM

## 2017-05-12 ENCOUNTER — Other Ambulatory Visit: Payer: Self-pay | Admitting: Allergy & Immunology

## 2017-05-14 ENCOUNTER — Encounter: Payer: Self-pay | Admitting: Obstetrics and Gynecology

## 2017-05-14 ENCOUNTER — Telehealth: Payer: Self-pay

## 2017-05-14 MED ORDER — LEVOCETIRIZINE DIHYDROCHLORIDE 5 MG PO TABS: 5.0000 mg | ORAL_TABLET | Freq: Every evening | ORAL | 11 refills | Status: DC

## 2017-05-14 NOTE — Telephone Encounter (Signed)
Patient called and wanted to know if she can get her injections once a week instead of every two weeks. She also wants to know if you can send in her a script for Xyzal because the clarinex is not working for her.  Please advise

## 2017-05-14 NOTE — Telephone Encounter (Signed)
Inform her that she can do her injection once weekly.

## 2017-05-14 NOTE — Telephone Encounter (Signed)
We can change her to weekly injections, not a problem at all. We can discontinue the Clarinex and restart the Xyzal 5mg  once daily. Script sent in.    Salvatore Marvel, MD [facility]

## 2017-05-15 ENCOUNTER — Ambulatory Visit (INDEPENDENT_AMBULATORY_CARE_PROVIDER_SITE_OTHER): Payer: Medicare Other

## 2017-05-15 DIAGNOSIS — J309 Allergic rhinitis, unspecified: Secondary | ICD-10-CM | POA: Diagnosis not present

## 2017-05-25 ENCOUNTER — Other Ambulatory Visit: Payer: Self-pay | Admitting: Pediatrics

## 2017-05-26 ENCOUNTER — Ambulatory Visit (INDEPENDENT_AMBULATORY_CARE_PROVIDER_SITE_OTHER): Payer: Medicare Other | Admitting: *Deleted

## 2017-05-26 DIAGNOSIS — J309 Allergic rhinitis, unspecified: Secondary | ICD-10-CM | POA: Diagnosis not present

## 2017-06-02 ENCOUNTER — Encounter: Payer: Self-pay | Admitting: Nurse Practitioner

## 2017-06-02 ENCOUNTER — Ambulatory Visit (INDEPENDENT_AMBULATORY_CARE_PROVIDER_SITE_OTHER): Payer: Medicare Other | Admitting: Nurse Practitioner

## 2017-06-02 ENCOUNTER — Telehealth: Payer: Self-pay | Admitting: Obstetrics and Gynecology

## 2017-06-02 VITALS — BP 146/82 | HR 80 | Temp 98.0°F | Resp 18 | Ht 63.0 in | Wt 122.0 lb

## 2017-06-02 DIAGNOSIS — R1030 Lower abdominal pain, unspecified: Secondary | ICD-10-CM

## 2017-06-02 LAB — POCT URINALYSIS DIPSTICK
BILIRUBIN UA: NEGATIVE
GLUCOSE UA: NEGATIVE
Ketones, UA: NEGATIVE
LEUKOCYTES UA: NEGATIVE
NITRITE UA: NEGATIVE
Protein, UA: NEGATIVE
RBC UA: NEGATIVE
UROBILINOGEN UA: 0.2 U/dL
pH, UA: 7 (ref 5.0–8.0)

## 2017-06-02 NOTE — Telephone Encounter (Signed)
Consider pelvic ultrasound with me on 06/03/17 after patient has been evaluated today. Thank you.  [place].

## 2017-06-02 NOTE — Patient Instructions (Signed)
Will call in am and schedule PUS

## 2017-06-02 NOTE — Telephone Encounter (Signed)
Spoke with patient. Patient states that for 2 weeks she has been experiencing bilateral lower abdominal/pelvic pain. Reports she has not had a change in diet or medications. Pain is continuous and feels like a dull ache. Denies any vaginal bleeding or discharge. Has been having regular bowel movements. Last bowel movement was today. Reports discomfort gets worse after urinating or having a BM. Felt this was GI related at first, but is now concerned about GYN problems. Would like to be seen for evaluation today. Requests to see Kem Boroughs, FNP as Dr.Silva is out of the office.Appointment scheduled for 06/02/2017 at 4 pm with Kem Boroughs, FNP. Patient declines earlier appointment due to travel to the office.   Cc: Dr.Silva  Routing to provider for final review. Patient agreeable to disposition. Will close encounter.

## 2017-06-02 NOTE — Progress Notes (Signed)
VIALS EXP 06-05-18  HC/JM

## 2017-06-02 NOTE — Telephone Encounter (Signed)
Patient has some lower abdominal pain that has been going on for about 2 weeks now.  No bleeding, discharge, or odor.  States she has an appointment with her PCP next week but wants to know if Dr Quincy Simmonds thinks she should be seen sooner.

## 2017-06-02 NOTE — Progress Notes (Signed)
Patient ID: Teresa Morales, female   DOB: 1941/01/07, 76 y.o.   MRN: [id number]  GYNECOLOGY  VISIT   HPI: 76 y.o.   Married  Caucasian  female  G1P1001 with Patient's last menstrual period was 12/15/1993 (approximate).   Pt presents today for acute visit relating to her gradual onset of abdominal pain of approximately 2 weeks. The pain is dull and achy. Pain scale from 4-8.  Pain last for 'some time' during the day.  It is intermittent and does not awaken her.  Some help with lying on the couch.  Slight relief after voiding.  After having a BM felt sore and aching throughout the entire lower colon and abdomen.  She did have diarrhea ~ 2 weeks ago that was semi loose and not explosive associated with an achy sensation.  Denies blood or mucous.  No fried or greasy foods.  No different types of foods or travel.  Some chills the first week, no fever.  No N/V.  Denies urinary symptoms of urgency, frequency, or dysuria.   No vaginal bleeding or spotting.  No pain with SA - last time a few weeks ago.  She did associate the type of pain with a menstrual cramp.  No change in medications.  BM is daily usually in am.  Bloating has been there all along X 2 weeks.  History of chronic allergies and takes antihistamines for which she is frequently bothered by.     GYNECOLOGIC HISTORY: Patient's last menstrual period was 12/15/1993 (approximate). Contraception:postmenopausal Menopausal hormone therapy: none        OB History    Gravida Para Term Preterm AB Living   1 1 1  0 0 1   SAB TAB Ectopic Multiple Live Births   0 0 0 0 1         Patient Active Problem List   Diagnosis Date Noted  . Gastroesophageal reflux disease without esophagitis 01/07/2016  . Insect sting allergy, current reaction 01/07/2016  . Rhinitis, allergic 08/25/2015  . Recurrent ventral hernia 05/31/2014    Past Medical History:  Diagnosis Date  . Arthritis    left thumb and lower back,  . Atypical glandular cells of  undetermined significance (AGUS) on cervical Pap smear 12/1998   endo polyp- [name]  . Disc degeneration, lumbar 2016   L4, L5, S1  . Endocervical polyp 1997  . Environmental allergies   . GERD (gastroesophageal reflux disease)   . Hypertension   . Interstitial cystitis 2004  . Melanoma of face ([place]) 04/2007  . Mild dysplasia of cervix    laser  . MRSA infection    many yrs ago -right hand(tx. with oral antibiotic)-no further issues  . Osteopenia    intolerant of bisphosphonates  . 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 Urethral stenosis    dilated    Past Surgical History:  Procedure Laterality Date  . COLONOSCOPY WITH PROPOFOL N/A 10/02/2015   Procedure: COLONOSCOPY WITH PROPOFOL;  Surgeon: Garlan Fair, MD;  Location: [facility];  Service: Endoscopy;  Laterality: N/A;  . HAND SURGERY     cyst removed from rt hand  . HERNIA REPAIR  2800, 02/15/90   umbilical mesh placed in August 2015.  Marland Kitchen HERNIA REPAIR  02-05-15   bilateral inguinal - mesh  . MELANOMA EXCISION Right    '08- right cheek- no futher issues  . varicose vein Left 2016   past years bilateral  . WISDOM TOOTH EXTRACTION  Current Outpatient Prescriptions  Medication Sig Dispense Refill  . azelastine (ASTELIN) 0.1 % nasal spray INSTILL 2 SPRAYS INTO BOTH NOSTRILS TWICE DAILY AS DIRECTED 30 mL 0  . Calcium Citrate-Vitamin D (CITRACAL + D PO) Take 2 tablets by mouth 2 (two) times daily.     Marland Kitchen CHERRY PO Take 4 tablets by mouth daily. Tart Cherry Extract    . cholecalciferol (VITAMIN D) 1000 UNITS tablet Take 1,000 Units by mouth daily.    Marland Kitchen EPINEPHrine (EPIPEN 2-PAK) 0.3 mg/0.3 mL IJ SOAJ injection Inject 0.3 mLs (0.3 mg total) into the muscle once. 2 Device 2  . estradiol (ESTRACE) 0.1 MG/GM vaginal cream USE 1/2 GRAMS VAGINALLY 2- 3 TIMES PER WEEK 30 g 1  . Eszopiclone (ESZOPICLONE) 3 MG TABS Take 3 mg by mouth as needed (for sleep). Take immediately before bedtime    .  Lactobacillus Rhamnosus, GG, (CULTURELLE PO) Take 1 tablet by mouth daily.     Marland Kitchen levocetirizine (XYZAL) 5 MG tablet Take 1 tablet (5 mg total) by mouth every evening. 30 tablet 11  . Misc Natural Products (OSTEO BI-FLEX JOINT SHIELD PO) Take 2 tablets by mouth daily.     . montelukast (SINGULAIR) 10 MG tablet TAKE 1 TABLET BY MOUTH EVERY EVENING TO PREVENT COUGH OR WHEEZE 90 tablet 1  . Multiple Vitamin (MULTIVITAMIN) capsule Take 1 capsule by mouth daily.    Marland Kitchen nystatin-triamcinolone (MYCOLOG II) cream Apply 1 application topically 2 (two) times daily. Apply to affected area BID for up to 5 days (Patient taking differently: Apply 1 application topically 2 (two) times daily as needed. Apply to affected area BID for up to 5 days) 60 g 0  . OMEGA 3 1000 MG CAPS Take 1 capsule by mouth daily.     Marland Kitchen omeprazole (PRILOSEC) 20 MG capsule Take 20 mg by mouth 2 (two) times daily.     Marland Kitchen OVER THE COUNTER MEDICATION Coconut oil -Takes 4 days a week vaginally and externally(uses 1gm(?) on the days she doesn't use Estrace cream    . PRESCRIPTION MEDICATION ALLERGY INJECTION  Takes every other week    . Triamcinolone Acetonide (NASACORT AQ NA) Place 1 spray into the nose 2 (two) times daily.    Marland Kitchen triamcinolone cream (KENALOG) 0.1 % Apply 1 application topically as needed (for rash).     . triamterene-hydrochlorothiazide (MAXZIDE-25) 37.5-25 MG per tablet Take 0.5 tablets by mouth daily.    Marland Kitchen trolamine salicylate (ASPERCREME) 10 % cream Apply 1 application topically as needed for muscle pain.    . valsartan (DIOVAN) 160 MG tablet TK 1 T PO QD IN THE EVE  5  . lidocaine (XYLOCAINE) 5 % ointment Apply 1 application topically 4 (four) times daily as needed. (Patient not taking: Reported on 06/02/2017) 30 g 0   No current facility-administered medications for this visit.      ALLERGIES: Macrodantin [nitrofurantoin]; Bee venom; Darvocet [propoxyphene n-acetaminophen]; Endocet [oxycodone-acetaminophen]; Floxin  [ofloxacin]; Latex; Penetrex [enoxacin]; and Sulfa antibiotics  Family History  Problem Relation Age of Onset  . Other Mother 48       MVA   . Lupus Sister 98  . Hypertension Father   . Osteoporosis Sister   . Thyroid disease Sister   . Scoliosis Sister   . Prostate cancer Brother   . Allergic rhinitis Neg Hx   . Angioedema Neg Hx   . Asthma Neg Hx   . Eczema Neg Hx   . Immunodeficiency Neg Hx   . Urticaria  Neg Hx     Social History   Social History  . Marital status: Married    Spouse name: N/A  . Number of children: N/A  . Years of education: N/A   Occupational History  . Not on file.   Social History Main Topics  . Smoking status: Never Smoker  . Smokeless tobacco: Never Used  . Alcohol use No  . Drug use: No  . Sexual activity: Yes    Partners: Male    Birth control/ protection: Post-menopausal     Comment: husband vasectomy   Other Topics Concern  . Not on file   Social History Narrative  . No narrative on file    Review of Systems  Constitutional: Negative.   HENT: Positive for sinus pain.   Eyes: Negative.   Respiratory: Negative.   Cardiovascular: Negative.   Gastrointestinal: Positive for abdominal pain.       Bloating  Genitourinary: Negative.        Loss of urine with sneeze or cough  Musculoskeletal: Negative.   Skin: Negative.   Neurological: Positive for headaches.  Endo/Heme/Allergies: Negative.   Psychiatric/Behavioral: Negative.     PHYSICAL EXAMINATION:    BP (!) 146/82 (BP Location: Right Arm, Patient Position: Sitting, Cuff Size: Normal)   Pulse 80   Temp 98 F (36.7 C) (Oral)   Resp 18   Ht 5\' 3"  (1.6 m)   Wt 122 lb (55.3 kg)   LMP 12/15/1993 (Approximate)   BMI 21.61 kg/m     General appearance: alert, cooperative and appears stated age  Abdomen: soft, non-tender; bowel sounds normal - almost hyperactive; no masses,  no organomegaly.  She is tender to deep palpation bilaterally.  No rebounding  Pelvic: External  genitalia:  no lesions              Urethra:  normal appearing urethra with no masses, tenderness or lesions              Bartholin's and Skene's: normal                 Vagina: normal appearing vagina with normal color and discharge, no lesions              Cervix: anteverted              Bimanual Exam:  Uterus:  normal size, contour, position, consistency, mobility, non-tender              Adnexa: normal adnexa and tenderness with deep palpation.              Rectovaginal: Yes.    Confirms - tender also with reproducing same type of pain              Anus:  normal sphincter tone, no lesions  Urine:  chemstrip is negative.   Chaperone was present for exam.  ASSESSMENT  Lower abdominal / pelvic pain X 2 weeks - R/O OV disease GI illness before onset of the pain that is now resolved Most likely diverticulitis as the cause.   PLAN Discussed a PUS to look at the ovaries and then move forward with a GI consult. Will need to call pt in am and get this scheduled after consult with Dr. Quincy Simmonds   An After Visit Summary was printed and given to the patient.

## 2017-06-03 NOTE — Progress Notes (Signed)
Patient will be contacted to schedule pelvic ultrasound as soon as possible.  This can be done either in office or in radiology facility setting.

## 2017-06-04 ENCOUNTER — Telehealth: Payer: Self-pay | Admitting: Internal Medicine

## 2017-06-04 ENCOUNTER — Telehealth: Payer: Self-pay | Admitting: Obstetrics and Gynecology

## 2017-06-04 ENCOUNTER — Other Ambulatory Visit: Payer: Self-pay

## 2017-06-04 ENCOUNTER — Encounter: Payer: Self-pay | Admitting: Obstetrics and Gynecology

## 2017-06-04 ENCOUNTER — Ambulatory Visit (INDEPENDENT_AMBULATORY_CARE_PROVIDER_SITE_OTHER): Payer: Medicare Other

## 2017-06-04 ENCOUNTER — Ambulatory Visit (INDEPENDENT_AMBULATORY_CARE_PROVIDER_SITE_OTHER): Payer: Medicare Other | Admitting: Obstetrics and Gynecology

## 2017-06-04 VITALS — BP 132/70 | HR 92 | Resp 16 | Wt 122.0 lb

## 2017-06-04 DIAGNOSIS — N9489 Other specified conditions associated with female genital organs and menstrual cycle: Secondary | ICD-10-CM | POA: Diagnosis not present

## 2017-06-04 DIAGNOSIS — R102 Pelvic and perineal pain: Secondary | ICD-10-CM

## 2017-06-04 DIAGNOSIS — R1030 Lower abdominal pain, unspecified: Secondary | ICD-10-CM

## 2017-06-04 DIAGNOSIS — N840 Polyp of corpus uteri: Secondary | ICD-10-CM | POA: Diagnosis not present

## 2017-06-04 NOTE — Progress Notes (Signed)
Encounter reviewed by Dr. Brook Amundson C. Silva.  

## 2017-06-04 NOTE — Telephone Encounter (Signed)
Please contact the patient in follow up to her EMB performed 06/04/17.  She had a vagal response following the procedure and was observed for a period of time in the office.

## 2017-06-04 NOTE — Patient Instructions (Signed)

## 2017-06-04 NOTE — Progress Notes (Signed)
GYNECOLOGY  VISIT   HPI: 76 y.o.   Married  Caucasian  female   G1P1001 with Patient's last menstrual period was 12/15/1993 (approximate).   here for   Ultrasound for lower abdominal pain.  Patient is experiencing 2.5 weeks of lower abdominal discomfort.  Initially felt discomfort in the upper abdomen, which is now gone.  It has settled in her lower abdomen.  Diarrhea 2.5 weeks ago.  Increased her probiotics and this is now improved and back to normal per patient.  No blood in the stool  No fever.  Felt chilled earlier this week.   No pain medication use for the discomfort.  No vaginal bleeding or spotting.   Using the Estrace vaginal cream 3 times per week.   Her GI is Dr. Earle Gell who is retiring.   GYNECOLOGIC HISTORY: Patient's last menstrual period was 12/15/1993 (approximate). Contraception:  postmenopausal Menopausal hormone therapy:  none Last mammogram:  04/29/17 BIRADS 2 benign Last pap smear:   04/09/16 Pap Negative        OB History    Gravida Para Term Preterm AB Living   1 1 1  0 0 1   SAB TAB Ectopic Multiple Live Births   0 0 0 0 1         Patient Active Problem List   Diagnosis Date Noted  . Gastroesophageal reflux disease without esophagitis 01/07/2016  . Insect sting allergy, current reaction 01/07/2016  . Rhinitis, allergic 08/25/2015  . Recurrent ventral hernia 05/31/2014    Past Medical History:  Diagnosis Date  . Arthritis    left thumb and lower back,  . Atypical glandular cells of undetermined significance (AGUS) on cervical Pap smear 12/1998   endo polyp- [name]  . Disc degeneration, lumbar 2016   L4, L5, S1  . Endocervical polyp 1997  . Environmental allergies   . GERD (gastroesophageal reflux disease)   . Hypertension   . Interstitial cystitis 2004  . Melanoma of face ([place]) 04/2007  . Mild dysplasia of cervix    laser  . MRSA infection    many yrs ago -right hand(tx. with oral antibiotic)-no further issues  .  Osteopenia    intolerant of bisphosphonates  . 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 Urethral stenosis    dilated    Past Surgical History:  Procedure Laterality Date  . COLONOSCOPY WITH PROPOFOL N/A 10/02/2015   Procedure: COLONOSCOPY WITH PROPOFOL;  Surgeon: Garlan Fair, MD;  Location: [facility];  Service: Endoscopy;  Laterality: N/A;  . HAND SURGERY     cyst removed from rt hand  . HERNIA REPAIR  0867, 05/15/94   umbilical mesh placed in August 2015.  Marland Kitchen HERNIA REPAIR  02-05-15   bilateral inguinal - mesh  . MELANOMA EXCISION Right    '08- right cheek- no futher issues  . varicose vein Left 2016   past years bilateral  . WISDOM TOOTH EXTRACTION      Current Outpatient Prescriptions  Medication Sig Dispense Refill  . azelastine (ASTELIN) 0.1 % nasal spray INSTILL 2 SPRAYS INTO BOTH NOSTRILS TWICE DAILY AS DIRECTED 30 mL 0  . Calcium Citrate-Vitamin D (CITRACAL + D PO) Take 2 tablets by mouth 2 (two) times daily.     Marland Kitchen CHERRY PO Take 4 tablets by mouth daily. Tart Cherry Extract    . cholecalciferol (VITAMIN D) 1000 UNITS tablet Take 1,000 Units by mouth daily.    Marland Kitchen EPINEPHrine (EPIPEN 2-PAK) 0.3 mg/0.3 mL  IJ SOAJ injection Inject 0.3 mLs (0.3 mg total) into the muscle once. 2 Device 2  . estradiol (ESTRACE) 0.1 MG/GM vaginal cream USE 1/2 GRAMS VAGINALLY 2- 3 TIMES PER WEEK 30 g 1  . Eszopiclone (ESZOPICLONE) 3 MG TABS Take 3 mg by mouth as needed (for sleep). Take immediately before bedtime    . Lactobacillus Rhamnosus, GG, (CULTURELLE PO) Take 1 tablet by mouth daily.     Marland Kitchen levocetirizine (XYZAL) 5 MG tablet Take 1 tablet (5 mg total) by mouth every evening. 30 tablet 11  . lidocaine (XYLOCAINE) 5 % ointment Apply 1 application topically 4 (four) times daily as needed. (Patient not taking: Reported on 06/02/2017) 30 g 0  . Misc Natural Products (OSTEO BI-FLEX JOINT SHIELD PO) Take 2 tablets by mouth daily.     . montelukast (SINGULAIR) 10 MG tablet  TAKE 1 TABLET BY MOUTH EVERY EVENING TO PREVENT COUGH OR WHEEZE 90 tablet 1  . Multiple Vitamin (MULTIVITAMIN) capsule Take 1 capsule by mouth daily.    Marland Kitchen nystatin-triamcinolone (MYCOLOG II) cream Apply 1 application topically 2 (two) times daily. Apply to affected area BID for up to 5 days (Patient taking differently: Apply 1 application topically 2 (two) times daily as needed. Apply to affected area BID for up to 5 days) 60 g 0  . OMEGA 3 1000 MG CAPS Take 1 capsule by mouth daily.     Marland Kitchen omeprazole (PRILOSEC) 20 MG capsule Take 20 mg by mouth 2 (two) times daily.     Marland Kitchen OVER THE COUNTER MEDICATION Coconut oil -Takes 4 days a week vaginally and externally(uses 1gm(?) on the days she doesn't use Estrace cream    . PRESCRIPTION MEDICATION ALLERGY INJECTION  Takes every other week    . Triamcinolone Acetonide (NASACORT AQ NA) Place 1 spray into the nose 2 (two) times daily.    Marland Kitchen triamcinolone cream (KENALOG) 0.1 % Apply 1 application topically as needed (for rash).     . triamterene-hydrochlorothiazide (MAXZIDE-25) 37.5-25 MG per tablet Take 0.5 tablets by mouth daily.    Marland Kitchen trolamine salicylate (ASPERCREME) 10 % cream Apply 1 application topically as needed for muscle pain.    . valsartan (DIOVAN) 160 MG tablet TK 1 T PO QD IN THE EVE  5   No current facility-administered medications for this visit.      ALLERGIES: Macrodantin [nitrofurantoin]; Bee venom; Darvocet [propoxyphene n-acetaminophen]; Endocet [oxycodone-acetaminophen]; Floxin [ofloxacin]; Latex; Penetrex [enoxacin]; and Sulfa antibiotics  Family History  Problem Relation Age of Onset  . Other Mother 56       MVA   . Lupus Sister 8  . Hypertension Father   . Osteoporosis Sister   . Thyroid disease Sister   . Scoliosis Sister   . Prostate cancer Brother   . Allergic rhinitis Neg Hx   . Angioedema Neg Hx   . Asthma Neg Hx   . Eczema Neg Hx   . Immunodeficiency Neg Hx   . Urticaria Neg Hx     Social History   Social  History  . Marital status: Married    Spouse name: N/A  . Number of children: N/A  . Years of education: N/A   Occupational History  . Not on file.   Social History Main Topics  . Smoking status: Never Smoker  . Smokeless tobacco: Never Used  . Alcohol use No  . Drug use: No  . Sexual activity: Yes    Partners: Male    Birth control/ protection: Post-menopausal  Comment: husband vasectomy   Other Topics Concern  . Not on file   Social History Narrative  . No narrative on file    ROS:  Pertinent items are noted in HPI.  PHYSICAL EXAMINATION:    BP 132/70 (BP Location: Right Arm, Patient Position: Sitting, Cuff Size: Normal)   Pulse 92   Resp 16   Wt 122 lb (55.3 kg)   LMP 12/15/1993 (Approximate)   BMI 21.61 kg/m     General appearance: alert, cooperative and appears stated age   Pelvic ultrasound: Uterus with 7 mm subserosal fibroid.  EMS 7.62 mm.  20 X 6 mm cystic thickened endometrium.  Avascular. Ovaries normal with calcifications.  No free fluid.  Endometrial biopsy: Consent for procedure.  Sterile prep with Hibiclens.  Paracervical block with 10 cc 1% lidocaine. Lot [id number], exp 11/21. Tenaculum to anterior cervical lip.  Pipelle passed to 7 cm twice.  Tissue to pathology.  Minimal EBL.   Patient remained lying down following the procedure where she was observed. She then had a vaginal response after I left the room and was attended by Dr. Sabra Heck and the nursing staff prior to my arrival.  Had serial BP monitoring, received oral fluids and crackers, and was observed until her husband arrived to take her home.  She was stable at the time of discharge.   Vitals post vasovagal response: BP 72/48, P 48 BP 100/60, P 80 BP 116/60, P 72 BP 126/72  Chaperone was present for exam.  ASSESSMENT  Lower pelvic pain.  Endometrial mass.  Vasovagal response following EMB.  PLAN  Discussion of endometrial mass and potential etiologies of polyp,  precancerous change, or even cancer.  Follow up EMB. If benign polyp is confirmed, hysteroscopy with polypectomy, dilation and curettage at [facility].  If malignancy is confirmed, referral to [facility]. Call for heavy bleeding, increasing pain, or fever.    An After Visit Summary was printed and given to the patient.  __25____ minutes face to face time of which over 50% was spent in counseling.

## 2017-06-04 NOTE — Telephone Encounter (Signed)
Dr. Earle Gell is retiring and is requesting that patient see Dr. Hilarie Fredrickson. Patient states that she will callback to schedule. Records placed in "records reviewed" folder.

## 2017-06-05 DIAGNOSIS — J3089 Other allergic rhinitis: Secondary | ICD-10-CM | POA: Diagnosis not present

## 2017-06-05 NOTE — Telephone Encounter (Signed)
Spoke with patient. Patient states that she is feeling much better today. Last night she felt "wiped out." Ate a good meal and got plenty of sleep. Reports she woke up today and is feeling much better. Plans to take it easy today to see how she feels. Will contact the office with any concerns.  Routing to provider for final review. Patient agreeable to disposition. Will close encounter.

## 2017-06-08 ENCOUNTER — Telehealth: Payer: Self-pay | Admitting: Obstetrics and Gynecology

## 2017-06-08 NOTE — Telephone Encounter (Signed)
Patient wants to speak with [facility]. She wants to know when she can start using any kind of vaginal cream.

## 2017-06-08 NOTE — Telephone Encounter (Signed)
Spoke with patient. Patient had an EMB on 06/04/2017. Asking if she may restart use os estrace cream and coconut oil. States she has not had any spotting since 06/06/2017. Denies any discomfort or bleeding. Advised okay to restart estrace and coconut oil at this time. Patient is agreeable. Aware EMB results are still pending and she will be contacted with results as soon as they return.  Routing to provider for final review. Patient agreeable to disposition. Will close encounter.

## 2017-06-09 ENCOUNTER — Ambulatory Visit (INDEPENDENT_AMBULATORY_CARE_PROVIDER_SITE_OTHER): Payer: Medicare Other

## 2017-06-09 DIAGNOSIS — J309 Allergic rhinitis, unspecified: Secondary | ICD-10-CM | POA: Diagnosis not present

## 2017-06-11 ENCOUNTER — Telehealth: Payer: Self-pay | Admitting: Obstetrics and Gynecology

## 2017-06-11 NOTE — Telephone Encounter (Signed)
Spoke with patient regarding benefit for recommended surgery. Patient understood and agreeable. Patient has confirmed and is ready to proceed with scheduling. Patient aware this is professional benefit only. Patient aware will be contacted by hospital for separate benefits. Forwarding to nurse supervisor for scheduling.  Routing to Lamont Snowball, RN

## 2017-06-12 NOTE — Telephone Encounter (Signed)
Patient calling to schedule procedure

## 2017-06-12 NOTE — Telephone Encounter (Signed)
Return call to patient. Per ROI can leave message on home voice mail and it confirms patient's name.  Left message advising of surgery date of 06-29-17 at 0945 at [facility]. Call back to review instructions and schedule appointments.

## 2017-06-15 NOTE — Telephone Encounter (Signed)
Message not needed. °

## 2017-06-16 ENCOUNTER — Ambulatory Visit (INDEPENDENT_AMBULATORY_CARE_PROVIDER_SITE_OTHER): Payer: Medicare Other | Admitting: *Deleted

## 2017-06-16 DIAGNOSIS — J309 Allergic rhinitis, unspecified: Secondary | ICD-10-CM | POA: Diagnosis not present

## 2017-06-16 NOTE — Telephone Encounter (Signed)
Return call from patient. Surgery instruction sheet reviewed and printed copy will be provided at consult appointment on 06-22-17.  Brief discussion of post op recovery provided and questions answered. Advised Dr Quincy Simmonds will discuss in detail at consult.   Routing to provider for final review. Patient agreeable to disposition. Will close encounter.

## 2017-06-16 NOTE — Telephone Encounter (Signed)
Call to patient. Left message to call back. Need to review surgery instruction sheet and schedule surgery appointments. Left message to call back.

## 2017-06-19 NOTE — Patient Instructions (Addendum)
Your procedure is scheduled on:  Monday, June 29, 2017  Enter through the [employer] [facility] at:  8:15 AM  Pick up the phone at the desk and dial [phone].  Call this number if you have problems the morning of surgery: [phone].  Remember: Do NOT eat food or drink after:  Midnight Sunday  Take these medicines the morning of surgery with a SIP OF WATER:  Triamterene, Omeprazole  Stop ALL herbal medications or Omega 3 at this time  Do NOT smoke the day of surgery.  Do NOT wear jewelry (body piercing), metal hair clips/bobby pins, make-up, artifical eyelashes or nail polish. Do NOT wear lotions, powders, or perfumes.  You may wear deodorant. Do NOT shave for 48 hours prior to surgery. Do NOT bring valuables to the hospital. Contacts, dentures, or bridgework may not be worn into surgery.  Have a responsible adult drive you home and stay with you for 24 hours after your procedure  Bring a copy of your healthcare power of attorney and living will documents.

## 2017-06-22 ENCOUNTER — Ambulatory Visit (INDEPENDENT_AMBULATORY_CARE_PROVIDER_SITE_OTHER): Payer: Medicare Other

## 2017-06-22 ENCOUNTER — Encounter: Payer: Self-pay | Admitting: Obstetrics and Gynecology

## 2017-06-22 ENCOUNTER — Ambulatory Visit (INDEPENDENT_AMBULATORY_CARE_PROVIDER_SITE_OTHER): Payer: Medicare Other | Admitting: Obstetrics and Gynecology

## 2017-06-22 VITALS — BP 150/78 | HR 80 | Resp 16 | Wt 122.0 lb

## 2017-06-22 DIAGNOSIS — J309 Allergic rhinitis, unspecified: Secondary | ICD-10-CM

## 2017-06-22 DIAGNOSIS — N840 Polyp of corpus uteri: Secondary | ICD-10-CM

## 2017-06-22 NOTE — Progress Notes (Signed)
GYNECOLOGY  VISIT   HPI: 76 y.o.   Married  Caucasian  female   G1P1001 with Patient's last menstrual period was 12/15/1993 (approximate).   here for  Surgery consult.  Patient has lower abdominal discomfort which prompted a pelvic ultrasound showing an endometrial mass.  Endometrial biopsy confirmed a polyp.  She had a vasovagal response after the office EMB on 06/04/17.  No vaginal bleeding.  Using vaginal Estrace cream three times a week.   Has a preop tomorrow at [facility].   Hx of hyponatremia many years ago.  States she sodium runs low every year when she has her labs done with PCP.  Hx SIADH.   Hx umbilical hernia surgery.  Thinks she has a mesh now.   States she had a reaction to a prep used in her prior surgery.  Chlorhexadine?  Some stress incontinence.  Has some diarrhea and bloating.   GYNECOLOGIC HISTORY: Patient's last menstrual period was 12/15/1993 (approximate). Contraception:  Postmenopausal Menopausal hormone therapy:  none Last mammogram:  04/29/17 BIRADS 2 benign Last pap smear:   04/09/16 Pap Negative        OB History    Gravida Para Term Preterm AB Living   1 1 1  0 0 1   SAB TAB Ectopic Multiple Live Births   0 0 0 0 1         Patient Active Problem List   Diagnosis Date Noted  . Gastroesophageal reflux disease without esophagitis 01/07/2016  . Insect sting allergy, current reaction 01/07/2016  . Rhinitis, allergic 08/25/2015  . Recurrent ventral hernia 05/31/2014    Past Medical History:  Diagnosis Date  . Arthritis    left thumb and lower back,  . Atypical glandular cells of undetermined significance (AGUS) on cervical Pap smear 12/1998   endo polyp- [name]  . Disc degeneration, lumbar 2016   L4, L5, S1  . Endocervical polyp 1997  . Environmental allergies   . GERD (gastroesophageal reflux disease)   . Hypertension   . Hyponatremia   . Interstitial cystitis 2004  . Melanoma of face ([place]) 04/2007  . Mild dysplasia  of cervix    laser  . MRSA infection    many yrs ago -right hand(tx. with oral antibiotic)-no further issues  . Osteopenia    intolerant of bisphosphonates  . 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 Urethral stenosis    dilated    Past Surgical History:  Procedure Laterality Date  . COLONOSCOPY WITH PROPOFOL N/A 10/02/2015   Procedure: COLONOSCOPY WITH PROPOFOL;  Surgeon: Garlan Fair, MD;  Location: [facility];  Service: Endoscopy;  Laterality: N/A;  . HAND SURGERY     cyst removed from rt hand  . HERNIA REPAIR  5631, 03/23/69   umbilical mesh placed in August 2015.  Marland Kitchen HERNIA REPAIR  02-05-15   bilateral inguinal - mesh  . MELANOMA EXCISION Right    '08- right cheek- no futher issues  . varicose vein Left 2016   past years bilateral  . WISDOM TOOTH EXTRACTION      Current Outpatient Prescriptions  Medication Sig Dispense Refill  . azelastine (ASTELIN) 0.1 % nasal spray INSTILL 2 SPRAYS INTO BOTH NOSTRILS TWICE DAILY AS DIRECTED 30 mL 0  . Calcium Citrate-Vitamin D (CITRACAL + D PO) Take 2 tablets by mouth 2 (two) times daily.     Marland Kitchen CHERRY PO Take 2 tablets by mouth 2 (two) times daily. Tart Cherry Extract     .  cholecalciferol (VITAMIN D) 1000 UNITS tablet Take 1,000 Units by mouth daily.    Marland Kitchen EPINEPHrine (EPIPEN 2-PAK) 0.3 mg/0.3 mL IJ SOAJ injection Inject 0.3 mLs (0.3 mg total) into the muscle once. (Patient taking differently: Inject 0.3 mg into the muscle once as needed (allergic reaction). ) 2 Device 2  . estradiol (ESTRACE) 0.1 MG/GM vaginal cream USE 1/2 GRAMS VAGINALLY 2- 3 TIMES PER WEEK (Patient taking differently: Place 1 g vaginally 3 (three) times a week. ) 30 g 1  . Eszopiclone (ESZOPICLONE) 3 MG TABS Take 3 mg by mouth at bedtime as needed (for sleep). Take immediately before bedtime     . Lactobacillus Rhamnosus, GG, (CULTURELLE PO) Take 1 tablet by mouth daily.     Marland Kitchen lidocaine (XYLOCAINE) 5 % ointment Apply 1 application topically 4  (four) times daily as needed. 30 g 0  . Misc Natural Products (OSTEO BI-FLEX JOINT SHIELD PO) Take 2 tablets by mouth daily.     . montelukast (SINGULAIR) 10 MG tablet TAKE 1 TABLET BY MOUTH EVERY EVENING TO PREVENT COUGH OR WHEEZE 90 tablet 1  . Multiple Vitamin (MULTIVITAMIN) capsule Take 1 capsule by mouth daily.    . OMEGA 3 1000 MG CAPS Take 1 capsule by mouth daily.     Marland Kitchen omeprazole (PRILOSEC) 20 MG capsule Take 20 mg by mouth 2 (two) times daily.     Marland Kitchen OVER THE COUNTER MEDICATION Coconut oil -Takes 4 days a week vaginally and externally(uses 1gm(?) on the days she doesn't use Estrace cream    . Triamcinolone Acetonide (NASACORT AQ NA) Place 1 spray into the nose 2 (two) times daily.    Marland Kitchen triamcinolone cream (KENALOG) 0.1 % Apply 1 application topically 2 (two) times daily as needed (for rash).     . triamterene-hydrochlorothiazide (MAXZIDE-25) 37.5-25 MG per tablet Take 0.5 tablets by mouth daily.    Marland Kitchen trolamine salicylate (ASPERCREME) 10 % cream Apply 1 application topically at bedtime as needed for muscle pain.     . valsartan (DIOVAN) 160 MG tablet Take 160 mg by mouth every evening.    Marland Kitchen levocetirizine (XYZAL) 5 MG tablet Take 1 tablet (5 mg total) by mouth every evening. 30 tablet 11   No current facility-administered medications for this visit.      ALLERGIES: Bee venom; Macrodantin [nitrofurantoin]; Darvocet [propoxyphene n-acetaminophen]; Endocet [oxycodone-acetaminophen]; Floxin [ofloxacin]; Latex; Penetrex [enoxacin]; and Sulfa antibiotics  Family History  Problem Relation Age of Onset  . Other Mother 49       MVA   . Lupus Sister 62  . Hypertension Father   . Osteoporosis Sister   . Thyroid disease Sister   . Scoliosis Sister   . Prostate cancer Brother   . Allergic rhinitis Neg Hx   . Angioedema Neg Hx   . Asthma Neg Hx   . Eczema Neg Hx   . Immunodeficiency Neg Hx   . Urticaria Neg Hx     Social History   Social History  . Marital status: Married    Spouse  name: N/A  . Number of children: N/A  . Years of education: N/A   Occupational History  . Not on file.   Social History Main Topics  . Smoking status: Never Smoker  . Smokeless tobacco: Never Used  . Alcohol use No  . Drug use: No  . Sexual activity: Yes    Partners: Male    Birth control/ protection: Post-menopausal     Comment: husband vasectomy   Other  Topics Concern  . Not on file   Social History Narrative  . No narrative on file    ROS:  Pertinent items are noted in HPI.  PHYSICAL EXAMINATION:    BP (!) 150/78 (BP Location: Right Arm, Patient Position: Sitting, Cuff Size: Normal)   Pulse 80   Resp 16   Wt 122 lb (55.3 kg)   LMP 12/15/1993 (Approximate)   BMI 21.61 kg/m     General appearance: alert, cooperative and appears stated age Head: Normocephalic, without obvious abnormality, atraumatic Neck: no adenopathy, supple, symmetrical, trachea midline and thyroid normal to inspection and palpation Lungs: clear to auscultation bilaterally Heart: regular rate and rhythm Abdomen: soft, non-tender, no masses,  no organomegaly Extremities: extremities normal, atraumatic, no cyanosis or edema Skin: Skin color, texture, turgor normal. No rashes or lesions Lymph nodes: Cervical, supraclavicular, and axillary nodes normal. No abnormal inguinal nodes palpated Neurologic: Grossly normal  Pelvic: External genitalia:  no lesions              Urethra:  normal appearing urethra with no masses, tenderness or lesions              Bartholins and Skenes: normal                 Vagina: normal appearing vagina with normal color and discharge, no lesions              Cervix: no lesions                Bimanual Exam:  Uterus:  normal size, contour, position, consistency, mobility, non-tender              Adnexa: no mass, fullness, tenderness          Chaperone was present for exam.  ASSESSMENT  Hx endometrial mass. Benign polyp on EMB. Hx SIADH and hyponatremia.  Hx  umbilical hernia mesh placement. Hx chlorhexadine allergy?  PLAN  Discussion of endometrial mass/polyp. Discussion of hysteroscopy with Myosure polypectomy, dilation and curettage.  Risks, benefits, and alternatives reviewed. Risks include but are not limited to bleeding, infection, damage to surrounding organs including uterine perforation requiring hospitalization and laparoscopy, pulmonary edema, reaction to anesthesia, DVT, PE, death, need for further treatment and surgery including repeat hysteroscopy, hysterectomy or medical therapy.    Surgical expectations and recovery discussed.  Patient wishes to proceed. ACOG HOs on hysteroscopy and dilation and curettage. She will confirm her prep allergy with her prior surgeon.  Will check CBC and BMP preop.   An After Visit Summary was printed and given to the patient.  __25____ minutes face to face time of which over 50% was spent in counseling.

## 2017-06-23 ENCOUNTER — Encounter (HOSPITAL_COMMUNITY): Payer: Self-pay

## 2017-06-23 ENCOUNTER — Encounter (HOSPITAL_COMMUNITY)
Admission: RE | Admit: 2017-06-23 | Discharge: 2017-06-23 | Disposition: A | Discharge status: Home / Self Care | Payer: Medicare Other | Source: Ambulatory Visit | Attending: Obstetrics and Gynecology | Admitting: Obstetrics and Gynecology

## 2017-06-23 ENCOUNTER — Other Ambulatory Visit: Payer: Self-pay

## 2017-06-23 ENCOUNTER — Telehealth: Payer: Self-pay | Admitting: *Deleted

## 2017-06-23 DIAGNOSIS — Z01818 Encounter for other preprocedural examination: Secondary | ICD-10-CM | POA: Insufficient documentation

## 2017-06-23 HISTORY — DX: Asymptomatic varicose veins of unspecified lower extremity: I83.90

## 2017-06-23 HISTORY — DX: Sacroiliitis, not elsewhere classified: M46.1

## 2017-06-23 HISTORY — DX: Anemia, unspecified: D64.9

## 2017-06-23 HISTORY — DX: Headache: R51

## 2017-06-23 HISTORY — DX: Spondylosis without myelopathy or radiculopathy, sacral and sacrococcygeal region: M47.818

## 2017-06-23 HISTORY — DX: Headache, unspecified: R51.9

## 2017-06-23 LAB — BASIC METABOLIC PANEL
ANION GAP: 7 (ref 5–15)
BUN: 10 mg/dL (ref 6–20)
CHLORIDE: 93 mmol/L — AB (ref 101–111)
CO2: 26 mmol/L (ref 22–32)
Calcium: 8.6 mg/dL — ABNORMAL LOW (ref 8.9–10.3)
Creatinine, Ser: 0.43 mg/dL — ABNORMAL LOW (ref 0.44–1.00)
GFR calc non Af Amer: >60 mL/min (ref >60)
Glucose, Bld: 89 mg/dL (ref 65–99)
Potassium: 3.6 mmol/L (ref 3.5–5.1)
Sodium: 126 mmol/L — ABNORMAL LOW (ref 135–145)

## 2017-06-23 LAB — CBC
HCT: 34.9 % — ABNORMAL LOW (ref 36.0–46.0)
HEMOGLOBIN: 12.6 g/dL (ref 12.0–15.0)
MCH: 33.2 pg (ref 26.0–34.0)
MCHC: 36.1 g/dL — ABNORMAL HIGH (ref 30.0–36.0)
MCV: 92.1 fL (ref 78.0–100.0)
Platelets: 316 10*3/uL (ref 150–400)
RBC: 3.79 MIL/uL — AB (ref 3.87–5.11)
RDW: 13.8 % (ref 11.5–15.5)
WBC: 4.9 10*3/uL (ref 4.0–10.5)

## 2017-06-23 LAB — SURGICAL PCR SCREEN
MRSA, PCR: NEGATIVE
Staphylococcus aureus: NEGATIVE

## 2017-06-23 NOTE — Telephone Encounter (Signed)
Patient notified of results and need for PCP evaluation and surgical clearance. Patient agreeable. Requests we contact PCP for assistance with appointment. Call to Dr Inda Merlin office. He is out of office until 07-06-17. Left message on [facility] voice mail and lab results faxed to [facility].She will assist in scheduling with another provider.  Call back to patient and update provided/

## 2017-06-23 NOTE — Telephone Encounter (Signed)
-----   Message from Nunzio Cobbs, MD sent at 06/23/2017  2:50 PM EDT ----- Please have patient see her PCP regarding her low sodium and chloride.  She states she has chronically low sodium.  Has dx of SIADH.  She needs to be cleared for surgery.

## 2017-06-24 DIAGNOSIS — N840 Polyp of corpus uteri: Secondary | ICD-10-CM | POA: Diagnosis not present

## 2017-06-24 DIAGNOSIS — R938 Abnormal findings on diagnostic imaging of other specified body structures: Secondary | ICD-10-CM | POA: Diagnosis not present

## 2017-06-24 DIAGNOSIS — H6120 Impacted cerumen, unspecified ear: Secondary | ICD-10-CM | POA: Diagnosis not present

## 2017-06-24 DIAGNOSIS — Z01818 Encounter for other preprocedural examination: Secondary | ICD-10-CM | POA: Diagnosis not present

## 2017-06-24 DIAGNOSIS — H6123 Impacted cerumen, bilateral: Secondary | ICD-10-CM | POA: Diagnosis not present

## 2017-06-24 DIAGNOSIS — E871 Hypo-osmolality and hyponatremia: Secondary | ICD-10-CM | POA: Diagnosis not present

## 2017-06-24 NOTE — Telephone Encounter (Signed)
Call from patient. States had PCP appointment today and repeat lab tests were done. States Dr Lysle Rubens did not think surgery would need to be canceled.  Dr Lysle Rubens instructed her to decrease "extra" fluids as she may be diluting her sodium with excess fluid. She was instructed to aim for 1500 cc per day. Patient thinks she probably drinks more than this, especially in warm summer months.  Advised will follow-up tomorrow after checking with Dr Glenna Durand office.

## 2017-06-24 NOTE — Telephone Encounter (Addendum)
Call from patient. She has appointment with Dr Lysle Rubens at PCP office today at 230. Reviewed lab results again as well as advising these had been faxed to PCP with office notes from Dr Quincy Simmonds.  Reports she confirmed with general surgeon that she had a local skin reaction to chlorohexadine with hernia surgery.  Sent EKG from hospital today.  Patient will call office following appointment today.   Update to Dr Quincy Simmonds.

## 2017-06-24 NOTE — Telephone Encounter (Signed)
Thank you for the update!

## 2017-06-25 ENCOUNTER — Other Ambulatory Visit: Payer: Self-pay

## 2017-06-25 MED ORDER — AZELASTINE HCL 0.1 % NA SOLN: NASAL | 3 refills | Status: AC

## 2017-06-25 NOTE — Telephone Encounter (Signed)
Sodium level I 132 and chloride 96 on 06/24/17 through PCP office.  Ok to proceed through surgery.  Please fax the office visit to PAT at [facility] for anesthesia to review.

## 2017-06-25 NOTE — Telephone Encounter (Signed)
Records received. Sent to Dr Quincy Simmonds desk for review.

## 2017-06-25 NOTE — Telephone Encounter (Signed)
Call placed to Tamika at [facility] PAT office and notified of records and labs being faxed for anesthesia review.

## 2017-06-25 NOTE — Telephone Encounter (Signed)
Call to Dr Glenna Durand office requesting notes and labs from office visit yesterday. Report to be faxed.

## 2017-06-26 ENCOUNTER — Ambulatory Visit (INDEPENDENT_AMBULATORY_CARE_PROVIDER_SITE_OTHER): Payer: Medicare Other

## 2017-06-26 DIAGNOSIS — J309 Allergic rhinitis, unspecified: Secondary | ICD-10-CM

## 2017-06-26 NOTE — Telephone Encounter (Signed)
Call placed to [facility] PAT approximately 1130. Records received and on patient chart. Chart to be reviewed by anesthesia and she will call back if any concerns.  1615. Call to patient. Advised no further needs identified by anesthesia. Proceed as scheduled.  Routing to provider for final review. Patient agreeable to disposition. Will close encounter.

## 2017-06-29 ENCOUNTER — Ambulatory Visit (HOSPITAL_COMMUNITY): Payer: Medicare Other | Admitting: Anesthesiology

## 2017-06-29 ENCOUNTER — Ambulatory Visit (HOSPITAL_COMMUNITY)
Admission: RE | Admit: 2017-06-29 | Discharge: 2017-06-29 | Disposition: A | Discharge status: Home / Self Care | Payer: Medicare Other | Source: Ambulatory Visit | Attending: Obstetrics and Gynecology | Admitting: Obstetrics and Gynecology

## 2017-06-29 ENCOUNTER — Encounter (HOSPITAL_COMMUNITY)
Admission: RE | Disposition: A | Discharge status: Home / Self Care | Payer: Self-pay | Source: Ambulatory Visit | Attending: Obstetrics and Gynecology

## 2017-06-29 ENCOUNTER — Encounter (HOSPITAL_COMMUNITY): Payer: Self-pay

## 2017-06-29 DIAGNOSIS — R102 Pelvic and perineal pain: Secondary | ICD-10-CM | POA: Diagnosis not present

## 2017-06-29 DIAGNOSIS — N840 Polyp of corpus uteri: Secondary | ICD-10-CM | POA: Diagnosis not present

## 2017-06-29 DIAGNOSIS — Z79899 Other long term (current) drug therapy: Secondary | ICD-10-CM | POA: Diagnosis not present

## 2017-06-29 DIAGNOSIS — Z9889 Other specified postprocedural states: Secondary | ICD-10-CM

## 2017-06-29 DIAGNOSIS — I10 Essential (primary) hypertension: Secondary | ICD-10-CM | POA: Insufficient documentation

## 2017-06-29 DIAGNOSIS — D649 Anemia, unspecified: Secondary | ICD-10-CM | POA: Diagnosis not present

## 2017-06-29 DIAGNOSIS — E871 Hypo-osmolality and hyponatremia: Secondary | ICD-10-CM

## 2017-06-29 DIAGNOSIS — K219 Gastro-esophageal reflux disease without esophagitis: Secondary | ICD-10-CM | POA: Diagnosis not present

## 2017-06-29 HISTORY — PX: DILATATION & CURETTAGE/HYSTEROSCOPY WITH MYOSURE: SHX6511

## 2017-06-29 LAB — BASIC METABOLIC PANEL
ANION GAP: 6 (ref 5–15)
BUN: 12 mg/dL (ref 6–20)
CALCIUM: 8.9 mg/dL (ref 8.9–10.3)
CO2: 28 mmol/L (ref 22–32)
Chloride: 98 mmol/L — ABNORMAL LOW (ref 101–111)
Creatinine, Ser: 0.34 mg/dL — ABNORMAL LOW (ref 0.44–1.00)
GFR calc Af Amer: >60 mL/min (ref >60)
Glucose, Bld: 101 mg/dL — ABNORMAL HIGH (ref 65–99)
POTASSIUM: 3.3 mmol/L — AB (ref 3.5–5.1)
SODIUM: 132 mmol/L — AB (ref 135–145)

## 2017-06-29 SURGERY — DILATATION & CURETTAGE/HYSTEROSCOPY WITH MYOSURE
Anesthesia: General | Site: Vagina

## 2017-06-29 MED ORDER — FENTANYL CITRATE (PF) 100 MCG/2ML IJ SOLN
INTRAMUSCULAR | Status: DC | PRN
  Administered 2017-06-29 (×2): 50 ug via INTRAVENOUS

## 2017-06-29 MED ORDER — DEXAMETHASONE SODIUM PHOSPHATE 10 MG/ML IJ SOLN
INTRAMUSCULAR | Status: DC | PRN
  Administered 2017-06-29: 4 mg via INTRAVENOUS

## 2017-06-29 MED ORDER — DEXAMETHASONE SODIUM PHOSPHATE 4 MG/ML IJ SOLN
INTRAMUSCULAR | Status: AC
  Filled 2017-06-29: qty 1

## 2017-06-29 MED ORDER — FENTANYL CITRATE (PF) 100 MCG/2ML IJ SOLN
INTRAMUSCULAR | Status: AC
  Filled 2017-06-29: qty 2

## 2017-06-29 MED ORDER — ONDANSETRON HCL 4 MG/2ML IJ SOLN: 4.0000 mg | Freq: Once | INTRAMUSCULAR | Status: DC | PRN

## 2017-06-29 MED ORDER — LIDOCAINE HCL 1 % IJ SOLN
INTRAMUSCULAR | Status: AC
  Filled 2017-06-29: qty 20

## 2017-06-29 MED ORDER — LACTATED RINGERS IV SOLN
INTRAVENOUS | Status: DC
  Administered 2017-06-29: 08:00:00 via INTRAVENOUS

## 2017-06-29 MED ORDER — PHENYLEPHRINE HCL 10 MG/ML IJ SOLN
INTRAMUSCULAR | Status: DC | PRN
  Administered 2017-06-29: 80 ug via INTRAVENOUS
  Administered 2017-06-29: 60 ug via INTRAVENOUS
  Administered 2017-06-29: 100 ug via INTRAVENOUS

## 2017-06-29 MED ORDER — DIPHENHYDRAMINE HCL 50 MG/ML IJ SOLN
6.2500 mg | Freq: Once | INTRAMUSCULAR | Status: AC
  Administered 2017-06-29: 6.5 mg via INTRAVENOUS

## 2017-06-29 MED ORDER — LIDOCAINE HCL (CARDIAC) 20 MG/ML IV SOLN
INTRAVENOUS | Status: AC
  Filled 2017-06-29: qty 5

## 2017-06-29 MED ORDER — FENTANYL CITRATE (PF) 100 MCG/2ML IJ SOLN
25.0000 ug | INTRAMUSCULAR | Status: DC | PRN
  Administered 2017-06-29: 25 ug via INTRAVENOUS
  Administered 2017-06-29: 50 ug via INTRAVENOUS

## 2017-06-29 MED ORDER — ONDANSETRON HCL 4 MG/2ML IJ SOLN
INTRAMUSCULAR | Status: AC
  Filled 2017-06-29: qty 2

## 2017-06-29 MED ORDER — LIDOCAINE HCL 1 % IJ SOLN
INTRAMUSCULAR | Status: DC | PRN
  Administered 2017-06-29: 10 mL

## 2017-06-29 MED ORDER — LIDOCAINE HCL (CARDIAC) 20 MG/ML IV SOLN
INTRAVENOUS | Status: DC | PRN
  Administered 2017-06-29: 60 mg via INTRAVENOUS

## 2017-06-29 MED ORDER — PROPOFOL 10 MG/ML IV BOLUS
INTRAVENOUS | Status: AC
  Filled 2017-06-29: qty 20

## 2017-06-29 MED ORDER — SODIUM CHLORIDE 0.9 % IR SOLN
Status: DC | PRN
  Administered 2017-06-29: 3000 mL

## 2017-06-29 MED ORDER — ONDANSETRON HCL 4 MG/2ML IJ SOLN
INTRAMUSCULAR | Status: DC | PRN
  Administered 2017-06-29: 4 mg via INTRAVENOUS

## 2017-06-29 MED ORDER — PROPOFOL 10 MG/ML IV BOLUS
INTRAVENOUS | Status: DC | PRN
  Administered 2017-06-29: 120 mg via INTRAVENOUS

## 2017-06-29 MED ORDER — DIPHENHYDRAMINE HCL 50 MG/ML IJ SOLN
INTRAMUSCULAR | Status: AC
  Filled 2017-06-29: qty 1

## 2017-06-29 MED ORDER — IBUPROFEN 800 MG PO TABS: 800.0000 mg | ORAL_TABLET | Freq: Three times a day (TID) | ORAL | 0 refills | Status: AC | PRN

## 2017-06-29 MED ORDER — MIDAZOLAM HCL 2 MG/2ML IJ SOLN
INTRAMUSCULAR | Status: AC
  Filled 2017-06-29: qty 2

## 2017-06-29 MED ORDER — PHENYLEPHRINE 40 MCG/ML (10ML) SYRINGE FOR IV PUSH (FOR BLOOD PRESSURE SUPPORT)
PREFILLED_SYRINGE | INTRAVENOUS | Status: AC
  Filled 2017-06-29: qty 10

## 2017-06-29 SURGICAL SUPPLY — 18 items
CANISTER SUCT 3000ML PPV (MISCELLANEOUS) ×3 IMPLANT
CATH ROBINSON RED A/P 16FR (CATHETERS) ×3 IMPLANT
CLOTH BEACON ORANGE TIMEOUT ST (SAFETY) ×3 IMPLANT
CONTAINER PREFILL 10% NBF 60ML (FORM) ×6 IMPLANT
DEVICE MYOSURE LITE (MISCELLANEOUS) ×3 IMPLANT
DEVICE MYOSURE REACH (MISCELLANEOUS) IMPLANT
FILTER ARTHROSCOPY CONVERTOR (FILTER) ×3 IMPLANT
GLOVE BIO SURGEON STRL SZ 6.5 (GLOVE) ×2 IMPLANT
GLOVE BIO SURGEONS STRL SZ 6.5 (GLOVE) ×1
GLOVE BIOGEL PI IND STRL 7.0 (GLOVE) ×2 IMPLANT
GLOVE BIOGEL PI INDICATOR 7.0 (GLOVE) ×4
GOWN STRL REUS W/TWL LRG LVL3 (GOWN DISPOSABLE) ×6 IMPLANT
PACK VAGINAL MINOR WOMEN LF (CUSTOM PROCEDURE TRAY) ×3 IMPLANT
PAD OB MATERNITY 4.3X12.25 (PERSONAL CARE ITEMS) ×3 IMPLANT
SEAL ROD LENS SCOPE MYOSURE (ABLATOR) ×3 IMPLANT
TOWEL OR 17X24 6PK STRL BLUE (TOWEL DISPOSABLE) ×6 IMPLANT
TUBING AQUILEX INFLOW (TUBING) ×3 IMPLANT
TUBING AQUILEX OUTFLOW (TUBING) ×3 IMPLANT

## 2017-06-29 NOTE — Anesthesia Procedure Notes (Signed)
Procedure Name: LMA Insertion Date/Time: 06/29/2017 8:35 AM Performed by: Casimer Lanius A Pre-anesthesia Checklist: Patient identified, Patient being monitored, Emergency Drugs available, Timeout performed and Suction available Patient Re-evaluated:Patient Re-evaluated prior to induction Oxygen Delivery Method: Circle System Utilized Preoxygenation: Pre-oxygenation with 100% oxygen Induction Type: IV induction Ventilation: Mask ventilation without difficulty LMA: LMA inserted LMA Size: 4.0 Number of attempts: 1 Placement Confirmation: positive ETCO2 and breath sounds checked- equal and bilateral

## 2017-06-29 NOTE — Discharge Instructions (Addendum)
Hello Ms. Teresa Morales,   I found and removed the endometrial polyp.  You did well with surgery today!  Josefa Half, MD  Hysteroscopy, Care After Refer to this sheet in the next few weeks. These instructions provide you with information on caring for yourself after your procedure. Your health care provider may also give you more specific instructions. Your treatment has been planned according to current medical practices, but problems sometimes occur. Call your health care provider if you have any problems or questions after your procedure. What can I expect after the procedure? After your procedure, it is typical to have the following:  You may have some cramping. This normally lasts for a couple days.  You may have bleeding. This can vary from light spotting for a few days to menstrual-like bleeding for 3-7 days.  Follow these instructions at home:  Rest for the first 1-2 days after the procedure.  Only take over-the-counter or prescription medicines as directed by your health care provider. Do not take aspirin. It can increase the chances of bleeding.  Take showers instead of baths for 2 weeks or as directed by your health care provider.  Do not drive for 24 hours or as directed.  Do not drink alcohol while taking pain medicine.  Do not use tampons, douche, or have sexual intercourse for 2 weeks or until your health care provider says it is okay.  Take your temperature twice a day for 4-5 days. Write it down each time.  Follow your health care provider's advice about diet, exercise, and lifting.  If you develop constipation, you may: ? Take a mild laxative if your health care provider approves. ? Add bran foods to your diet. ? Drink enough fluids to keep your urine clear or pale yellow.  Try to have someone with you or available to you for the first 24-48 hours, especially if you were given a general anesthetic.  Follow up with your health care provider as directed. Contact a  health care provider if:  You feel dizzy or lightheaded.  You feel sick to your stomach (nauseous).  You have abnormal vaginal discharge.  You have a rash.  You have pain that is not controlled with medicine. Get help right away if:  You have bleeding that is heavier than a normal menstrual period.  You have a fever.  You have increasing cramps or pain, not controlled with medicine.  You have new belly (abdominal) pain.  You pass out.  You have pain in the tops of your shoulders (shoulder strap areas).  You have shortness of breath. This information is not intended to replace advice given to you by your health care provider. Make sure you discuss any questions you have with your health care provider. Document Released: 09/21/2013 Document Revised: 05/08/2016 Document Reviewed: 06/30/2013 Elsevier Interactive Patient Education  2017 [employer]   Dilation and Curettage or Vacuum Curettage, Care After These instructions give you information about caring for yourself after your procedure. Your doctor may also give you more specific instructions. Call your doctor if you have any problems or questions after your procedure. Follow these instructions at home: Activity  Do not drive or use heavy machinery while taking prescription pain medicine.  For 24 hours after your procedure, avoid driving.  Take short walks often, followed by rest periods. Ask your doctor what activities are safe for you. After one or two days, you may be able to return to your normal activities.  Do not lift anything that  is heavier than 10 lb (4.5 kg) until your doctor approves.  For at least 2 weeks, or as long as told by your doctor: ? Do not douche. ? Do not use tampons. ? Do not have sex. General instructions  Take over-the-counter and prescription medicines only as told by your doctor. This is very important if you take blood thinning medicine.  Do not take baths, swim, or use a hot tub  until your doctor approves. Take showers instead of baths.  Wear compression stockings as told by your doctor.  It is up to you to get the results of your procedure. Ask your doctor when your results will be ready.  Keep all follow-up visits as told by your doctor. This is important. Contact a doctor if:  You have very bad cramps that get worse or do not get better with medicine.  You have very bad pain in your belly (abdomen).  You cannot drink fluids without throwing up (vomiting).  You get pain in a different part of the area between your belly and thighs (pelvis).  You have bad-smelling discharge from your vagina.  You have a rash. Get help right away if:  You are bleeding a lot from your vagina. A lot of bleeding means soaking more than one sanitary pad in an hour, for 2 hours in a row.  You have clumps of blood (blood clots) coming from your vagina.  You have a fever or chills.  Your belly feels very tender or hard.  You have chest pain.  You have trouble breathing.  You cough up blood.  You feel dizzy.  You feel light-headed.  You pass out (faint).  You have pain in your neck or shoulder area. Summary  Take short walks often, followed by rest periods. Ask your doctor what activities are safe for you. After one or two days, you may be able to return to your normal activities.  Do not lift anything that is heavier than 10 lb (4.5 kg) until your doctor approves.  Do not take baths, swim, or use a hot tub until your doctor approves. Take showers instead of baths.  Contact your doctor if you have any symptoms of infection, like bad-smelling discharge from your vagina. This information is not intended to replace advice given to you by your health care provider. Make sure you discuss any questions you have with your health care provider. Document Released: 09/09/2008 Document Revised: 08/18/2016 Document Reviewed: 08/18/2016 Elsevier Interactive Patient Education   2017 [facility] Instructions  Activity: Get plenty of rest for the remainder of the day. A responsible individual must stay with you for 24 hours following the procedure.  For the next 24 hours, DO NOT: -Drive a car -Paediatric nurse -Drink alcoholic beverages -Take any medication unless instructed by your physician -Make any legal decisions or sign important papers.  Meals: Start with liquid foods such as gelatin or soup. Progress to regular foods as tolerated. Avoid greasy, spicy, heavy foods. If nausea and/or vomiting occur, drink only clear liquids until the nausea and/or vomiting subsides. Call your physician if vomiting continues.  Special Instructions/Symptoms: Your throat may feel dry or sore from the anesthesia or the breathing tube placed in your throat during surgery. If this causes discomfort, gargle with warm salt water. The discomfort should disappear within 24 hours.

## 2017-06-29 NOTE — Transfer of Care (Signed)
Immediate Anesthesia Transfer of Care Note  Patient: Teresa Morales  Procedure(s) Performed: Procedure(s): DILATATION & CURETTAGE/HYSTEROSCOPY WITH MYOSURE (N/A)  Patient Location: PACU  Anesthesia Type:General  Level of Consciousness: sedated  Airway & Oxygen Therapy: Patient Spontanous Breathing and Patient connected to nasal cannula oxygen  Post-op Assessment: Report given to RN  Post vital signs: Reviewed and stable  Last Vitals:  Vitals:   06/29/17 0724  BP: (!) 142/110  Resp: 16  Temp: 36.4 C    Last Pain:  Vitals:   06/29/17 0724  TempSrc: Oral      Patients Stated Pain Goal: 3 (00/37/94 4461)  Complications: No apparent anesthesia complications

## 2017-06-29 NOTE — Brief Op Note (Signed)
06/29/2017  9:04 AM  PATIENT:  Teresa Morales  76 y.o. female  PRE-OPERATIVE DIAGNOSIS:  endometrial mass, pelvic cramping  POST-OPERATIVE DIAGNOSIS:  endometrial mass, pelvic cramping  PROCEDURE:  Procedure(s): DILATATION & CURETTAGE/HYSTEROSCOPY WITH MYOSURE (N/A)  SURGEON:  Surgeon(s) and Role:    * Nunzio Cobbs, MD - Primary  PHYSICIAN ASSISTANT: NA  ASSISTANTS: none   ANESTHESIA:   paracervical block and LMA  EBL:  Total I/O In: 400 [I.V.:400] Out: 255 [Urine:250; Blood:5]   NS deficit:  160 cc.   BLOOD ADMINISTERED:none  DRAINS: none   LOCAL MEDICATIONS USED:  LIDOCAINE  and Amount: 10 ml  SPECIMEN:  Source of Specimen:  Endometrial polyp, endometrial curettings.  DISPOSITION OF SPECIMEN:  PATHOLOGY  COUNTS:  YES  TOURNIQUET:  * No tourniquets in log *  DICTATION: .Note written in [place]: Discharge to home after PACU  PATIENT DISPOSITION:  PACU - hemodynamically stable.   Delay start of Pharmacological VTE agent (>24hrs) due to surgical blood loss or risk of bleeding: not applicable

## 2017-06-29 NOTE — Anesthesia Postprocedure Evaluation (Signed)
Anesthesia Post Note  Patient: NINOSHKA WAINWRIGHT  Procedure(s) Performed: Procedure(s) (LRB): DILATATION & CURETTAGE/HYSTEROSCOPY WITH MYOSURE (N/A)     Patient location during evaluation: PACU Anesthesia Type: General Level of consciousness: awake and alert Pain management: pain level controlled Vital Signs Assessment: post-procedure vital signs reviewed and stable Respiratory status: spontaneous breathing, nonlabored ventilation, respiratory function stable and patient connected to nasal cannula oxygen Cardiovascular status: blood pressure returned to baseline and stable Postop Assessment: no signs of nausea or vomiting Anesthetic complications: no    Last Vitals:  Vitals:   06/29/17 1145 06/29/17 1252  BP: 114/75 112/69  Pulse: 67 63  Resp: 14 16  Temp: 36.5 C 36.6 C    Last Pain:  Vitals:   06/29/17 1252  TempSrc:   PainSc: 2    Pain Goal: Patients Stated Pain Goal: 3 (06/29/17 1252)               Catalina Gravel

## 2017-06-29 NOTE — Op Note (Signed)
OPERATIVE REPORT   PREOPERATIVE DIAGNOSES:   Endometrial polyp, pelvic cramping.   POSTOPERATIVE DIAGNOSES:   Endometrial polyp, pelvic cramping, scarring of the uterine cavity.   PROCEDURE:  Hysteroscopy with dilation and curettage and Myosure resection of endometrial polyp.  SURGEON:  Lenard Galloway, MD  ANESTHESIA:  LMA, paracervical block with 10 mL of 1% lidocaine.  IV FLUIDS:  400 cc.  EBL:  5 cc.   URINE OUTPUT: 250 cc.  NORMAL SALINE DEFICIT:   160 cc.  COMPLICATIONS:  None.  INDICATIONS FOR THE PROCEDURE:     The patient is a 76 year old G31P1 Caucasian female who presented with pelvic cramping and pelvic ultrasound showing an endometrial mass.  Her ovaries demonstrated peripheral calcifications. Endometrial biopsy confirmed the presence of an endometrial polyp.  A plan is now made to proceed with a hysteroscopy with dilation and curettage and Myosure resection of endometrial polyp; after risks, benefits and alternatives were reviewed.  FINDINGS:  Exam under anesthesia revealed a small anteverted mobile uterus.  The uterus was sounded to 7 cm. Hysteroscopy showed polypoid formation and scarring in the left cornual region. No fibroids were noted. Endometrial currettings were scanty.  SPECIMENS:  The endometrial polyp and endometrial curettings were sent  to Pathology separately.  PROCEDURE IN DETAIL:  The patient was reidentified in the preoperative hold area.  She  received TED hose and PAS stockings for DVT prophylaxis.  In the operating room, the patient was placed in the dorsal lithotomy position and then an LMA anesthetic was introduced.  The patient's lower abdomen, vagina and perineum were sterilely prepped with Betadine and the  patient's bladder was catheterized of urine.  She was sterilly draped  An exam under anesthesia was performed.  A speculum was placed inside the vagina and a single-tooth tenaculum was placed on the  anterior cervical lip.  A paracervical block was performed with a total of 10 mL of 1% lidocaine plain.  The uterus was sounded. The cervix was dilated to a #21 Pratt dilator.  The MyoSure hysteroscope was then inserted inside the uterine cavity under the continuous infusion of normal saline solution.  Findings are as noted above.  The polyp was resected, and then the MyoSure hysteroscope was removed.  The cervix  was further dilated to a #23 Pratt dilator.  The serrated curette were  introduced into the uterine cavity and the endometrium was curetted in all  4 quadrants.  A minimal amount of endometrial curettings was obtained.   This specimen was sent to Pathology.  The single-tooth tenaculum which had been placed on the anterior cervical lip was removed.  Hemostasis was good, and all of the vaginal  instruments were removed.  The patient was awakened and escorted to the recovery room in stable condition after she was cleansed with Betadine.  There were no complications to the procedure.  All needle, instrument and sponge counts were correct.  Lenard Galloway, MD

## 2017-06-29 NOTE — Anesthesia Preprocedure Evaluation (Addendum)
Anesthesia Evaluation  Patient identified by MRN, date of birth, ID band Patient awake    Reviewed: Allergy & Precautions, NPO status , Patient's Chart, lab work & pertinent test results  Airway Mallampati: II  TM Distance: >3 FB Neck ROM: Full    Dental  (+) Teeth Intact, Dental Advisory Given   Pulmonary neg pulmonary ROS,    Pulmonary exam normal breath sounds clear to auscultation       Cardiovascular hypertension, Pt. on medications + Peripheral Vascular Disease  Normal cardiovascular exam Rhythm:Regular Rate:Normal     Neuro/Psych  Headaches, H/o SIADH  negative psych ROS   GI/Hepatic Neg liver ROS, GERD  Medicated,  Endo/Other  negative endocrine ROS  Renal/GU negative Renal ROS   endometrial mass, pelvic cramping    Musculoskeletal  (+) Arthritis ,   Abdominal   Peds  Hematology  (+) Blood dyscrasia, anemia ,   Anesthesia Other Findings Day of surgery medications reviewed with the patient.  Reproductive/Obstetrics                            Anesthesia Physical Anesthesia Plan  ASA: II  Anesthesia Plan: General   Post-op Pain Management:    Induction: Intravenous  PONV Risk Score and Plan: 4 or greater and Ondansetron, Dexamethasone, Propofol and Treatment may vary due to age or medical condition  Airway Management Planned: LMA  Additional Equipment:   Intra-op Plan:   Post-operative Plan: Extubation in OR  Informed Consent: I have reviewed the patients History and Physical, chart, labs and discussed the procedure including the risks, benefits and alternatives for the proposed anesthesia with the patient or authorized representative who has indicated his/her understanding and acceptance.   Dental advisory given  Plan Discussed with: CRNA  Anesthesia Plan Comments: (Risks/benefits of general anesthesia discussed with patient including risk of damage to teeth, lips,  gum, and tongue, nausea/vomiting, allergic reactions to medications, and the possibility of heart attack, stroke and death.  All patient questions answered.  Patient wishes to proceed.)        Anesthesia Quick Evaluation

## 2017-06-29 NOTE — H&P (Signed)
Office Visit   06/22/2017 [place] Silva, Everardo All, MD  Obstetrics and Gynecology   Endometrial polyp  Dx   Office Visit ; Referred by Josetta Huddle, MD  Reason for Visit   Additional Documentation   Vitals:   BP  150/78 (BP Location: Right Arm, Patient Position: Sitting, Cuff Size: Normal)   Pulse 80   Resp 16   Wt 122 lb (55.3 kg)   LMP 12/15/1993 (Approximate)   BMI 21.61 kg/m   BSA 1.57 m   Flowsheets:   Infectious Disease Screening,   Custom Formula Data,   MEWS Score,   Anthropometrics     Encounter Info:   Billing Info,   History,   Allergies,   Detailed Report     All Notes   Progress Notes by Nunzio Cobbs, MD at 06/22/2017 11:30 AM   Author: Nunzio Cobbs, MD Author Type: Physician Filed: 06/22/2017 12:50 PM  Note Status: Signed Cosign: Cosign Not Required Encounter Date: 06/22/2017  Editor: Nunzio Cobbs, MD (Physician)  Prior Versions: 1. Archie Balboa, CMA (Certified Psychologist, sport and exercise) at 06/22/2017 12:00 PM - Sign at close encounter    GYNECOLOGY  VISIT   HPI: 76 y.o.   Married  Caucasian  female   G1P1001 with Patient's last menstrual period was 12/15/1993 (approximate).   here for  Surgery consult.  Patient has lower abdominal discomfort which prompted a pelvic ultrasound showing an endometrial mass.  Endometrial biopsy confirmed a polyp.  She had a vasovagal response after the office EMB on 06/04/17.  No vaginal bleeding.  Using vaginal Estrace cream three times a week.   Has a preop tomorrow at [facility].   Hx of hyponatremia many years ago.  States she sodium runs low every year when she has her labs done with PCP.  Hx SIADH.   Hx umbilical hernia surgery.  Thinks she has a mesh now.   States she had a reaction to a prep used in her prior surgery.  Chlorhexadine?  Some stress incontinence.  Has some diarrhea and bloating.   GYNECOLOGIC  HISTORY: Patient's last menstrual period was 12/15/1993 (approximate). Contraception:  Postmenopausal Menopausal hormone therapy:  none Last mammogram:  04/29/17 BIRADS 2 benign Last pap smear:   04/09/16 Pap Negative                OB History    Gravida Para Term Preterm AB Living   1 1 1  0 0 1   SAB TAB Ectopic Multiple Live Births   0 0 0 0 1             Patient Active Problem List   Diagnosis Date Noted  . Gastroesophageal reflux disease without esophagitis 01/07/2016  . Insect sting allergy, current reaction 01/07/2016  . Rhinitis, allergic 08/25/2015  . Recurrent ventral hernia 05/31/2014        Past Medical History:  Diagnosis Date  . Arthritis    left thumb and lower back,  . Atypical glandular cells of undetermined significance (AGUS) on cervical Pap smear 12/1998   endo polyp- [name]  . Disc degeneration, lumbar 2016   L4, L5, S1  . Endocervical polyp 1997  . Environmental allergies   . GERD (gastroesophageal reflux disease)   . Hypertension   . Hyponatremia   . Interstitial cystitis 2004  . Melanoma of face ([place]) 04/2007  . Mild dysplasia of cervix  laser  . MRSA infection    many yrs ago -right hand(tx. with oral antibiotic)-no further issues  . Osteopenia    intolerant of bisphosphonates  . 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 Urethral stenosis    dilated         Past Surgical History:  Procedure Laterality Date  . COLONOSCOPY WITH PROPOFOL N/A 10/02/2015   Procedure: COLONOSCOPY WITH PROPOFOL;  Surgeon: Garlan Fair, MD;  Location: [facility];  Service: Endoscopy;  Laterality: N/A;  . HAND SURGERY     cyst removed from rt hand  . HERNIA REPAIR  1194, 12/21/38   umbilical mesh placed in August 2015.  Marland Kitchen HERNIA REPAIR  02-05-15   bilateral inguinal - mesh  . MELANOMA EXCISION Right    '08- right cheek- no futher issues  . varicose vein Left 2016   past years  bilateral  . WISDOM TOOTH EXTRACTION            Current Outpatient Prescriptions  Medication Sig Dispense Refill  . azelastine (ASTELIN) 0.1 % nasal spray INSTILL 2 SPRAYS INTO BOTH NOSTRILS TWICE DAILY AS DIRECTED 30 mL 0  . Calcium Citrate-Vitamin D (CITRACAL + D PO) Take 2 tablets by mouth 2 (two) times daily.     Marland Kitchen CHERRY PO Take 2 tablets by mouth 2 (two) times daily. Tart Cherry Extract     . cholecalciferol (VITAMIN D) 1000 UNITS tablet Take 1,000 Units by mouth daily.    Marland Kitchen EPINEPHrine (EPIPEN 2-PAK) 0.3 mg/0.3 mL IJ SOAJ injection Inject 0.3 mLs (0.3 mg total) into the muscle once. (Patient taking differently: Inject 0.3 mg into the muscle once as needed (allergic reaction). ) 2 Device 2  . estradiol (ESTRACE) 0.1 MG/GM vaginal cream USE 1/2 GRAMS VAGINALLY 2- 3 TIMES PER WEEK (Patient taking differently: Place 1 g vaginally 3 (three) times a week. ) 30 g 1  . Eszopiclone (ESZOPICLONE) 3 MG TABS Take 3 mg by mouth at bedtime as needed (for sleep). Take immediately before bedtime     . Lactobacillus Rhamnosus, GG, (CULTURELLE PO) Take 1 tablet by mouth daily.     Marland Kitchen lidocaine (XYLOCAINE) 5 % ointment Apply 1 application topically 4 (four) times daily as needed. 30 g 0  . Misc Natural Products (OSTEO BI-FLEX JOINT SHIELD PO) Take 2 tablets by mouth daily.     . montelukast (SINGULAIR) 10 MG tablet TAKE 1 TABLET BY MOUTH EVERY EVENING TO PREVENT COUGH OR WHEEZE 90 tablet 1  . Multiple Vitamin (MULTIVITAMIN) capsule Take 1 capsule by mouth daily.    . OMEGA 3 1000 MG CAPS Take 1 capsule by mouth daily.     Marland Kitchen omeprazole (PRILOSEC) 20 MG capsule Take 20 mg by mouth 2 (two) times daily.     Marland Kitchen OVER THE COUNTER MEDICATION Coconut oil -Takes 4 days a week vaginally and externally(uses 1gm(?) on the days she doesn't use Estrace cream    . Triamcinolone Acetonide (NASACORT AQ NA) Place 1 spray into the nose 2 (two) times daily.    Marland Kitchen triamcinolone cream (KENALOG) 0.1 %  Apply 1 application topically 2 (two) times daily as needed (for rash).     . triamterene-hydrochlorothiazide (MAXZIDE-25) 37.5-25 MG per tablet Take 0.5 tablets by mouth daily.    Marland Kitchen trolamine salicylate (ASPERCREME) 10 % cream Apply 1 application topically at bedtime as needed for muscle pain.     . valsartan (DIOVAN) 160 MG tablet Take 160 mg by mouth every evening.    Marland Kitchen  levocetirizine (XYZAL) 5 MG tablet Take 1 tablet (5 mg total) by mouth every evening. 30 tablet 11   No current facility-administered medications for this visit.      ALLERGIES: Bee venom; Macrodantin [nitrofurantoin]; Darvocet [propoxyphene n-acetaminophen]; Endocet [oxycodone-acetaminophen]; Floxin [ofloxacin]; Latex; Penetrex [enoxacin]; and Sulfa antibiotics       Family History  Problem Relation Age of Onset  . Other Mother 47       MVA   . Lupus Sister 66  . Hypertension Father   . Osteoporosis Sister   . Thyroid disease Sister   . Scoliosis Sister   . Prostate cancer Brother   . Allergic rhinitis Neg Hx   . Angioedema Neg Hx   . Asthma Neg Hx   . Eczema Neg Hx   . Immunodeficiency Neg Hx   . Urticaria Neg Hx     Social History        Social History  . Marital status: Married    Spouse name: N/A  . Number of children: N/A  . Years of education: N/A      Occupational History  . Not on file.         Social History Main Topics  . Smoking status: Never Smoker  . Smokeless tobacco: Never Used  . Alcohol use No  . Drug use: No  . Sexual activity: Yes    Partners: Male    Birth control/ protection: Post-menopausal     Comment: husband vasectomy       Other Topics Concern  . Not on file      Social History Narrative  . No narrative on file    ROS:  Pertinent items are noted in HPI.  PHYSICAL EXAMINATION:    BP (!) 150/78 (BP Location: Right Arm, Patient Position: Sitting, Cuff Size: Normal)   Pulse 80   Resp 16   Wt 122 lb (55.3 kg)    LMP 12/15/1993 (Approximate)   BMI 21.61 kg/m     General appearance: alert, cooperative and appears stated age Head: Normocephalic, without obvious abnormality, atraumatic Neck: no adenopathy, supple, symmetrical, trachea midline and thyroid normal to inspection and palpation Lungs: clear to auscultation bilaterally Heart: regular rate and rhythm Abdomen: soft, non-tender, no masses,  no organomegaly Extremities: extremities normal, atraumatic, no cyanosis or edema Skin: Skin color, texture, turgor normal. No rashes or lesions Lymph nodes: Cervical, supraclavicular, and axillary nodes normal. No abnormal inguinal nodes palpated Neurologic: Grossly normal  Pelvic: External genitalia:  no lesions              Urethra:  normal appearing urethra with no masses, tenderness or lesions              Bartholins and Skenes: normal                 Vagina: normal appearing vagina with normal color and discharge, no lesions              Cervix: no lesions                Bimanual Exam:  Uterus:  normal size, contour, position, consistency, mobility, non-tender              Adnexa: no mass, fullness, tenderness          Chaperone was present for exam.  ASSESSMENT  Hx endometrial mass. Benign polyp on EMB. Hx SIADH and hyponatremia.  Hx umbilical hernia mesh placement. Hx chlorhexadine allergy?  PLAN  Discussion of endometrial  mass/polyp. Discussion of hysteroscopy with Myosure polypectomy, dilation and curettage.  Risks, benefits, and alternatives reviewed. Risks include but are not limited to bleeding, infection, damage to surrounding organs including uterine perforation requiring hospitalization and laparoscopy, pulmonary edema, reaction to anesthesia, DVT, PE, death, need for further treatment and surgery including repeat hysteroscopy, hysterectomy or medical therapy.    Surgical expectations and recovery discussed.  Patient wishes to proceed. ACOG HOs on hysteroscopy and  dilation and curettage. She will confirm her prep allergy with her prior surgeon.  Will check CBC and BMP preop.   An After Visit Summary was printed and given to the patient.  __25____ minutes face to face time of which over 50% was spent in counseling.       Addendum - Update to History and Physical  Patient seen by her PCP regarding her hyponatremia.  She was told to fluid restrict.  Repeat Na level was improved.   She has a chlorhexadine allergy.   OK to proceed with surgery.

## 2017-06-30 ENCOUNTER — Encounter (HOSPITAL_COMMUNITY): Payer: Self-pay | Admitting: Obstetrics and Gynecology

## 2017-07-02 ENCOUNTER — Telehealth: Payer: Self-pay | Admitting: Obstetrics and Gynecology

## 2017-07-02 NOTE — Telephone Encounter (Signed)
Patient recently had surgery on 06/29/17 with Dr. Quincy Simmonds. Today she is calling requesting to speak with the nurse about "painful vaginal dryness" that "started today." She wants to know if there is something she can use to help with this.

## 2017-07-02 NOTE — Telephone Encounter (Signed)
Patient reports vaginal dryness started today and has increased throughout the day. Reports burning and vaginal discomfort. D&C on 06/29/17. Denies discharge, bleeding, odor or urinary complaints. States it has been 11 days since she has used vaginal estrogen or coconut oil, was advised not to restart until at least 2 weeks after surgery.   Recommended OV with Dr. Quincy Simmonds for further evaluation, scheduled for 7/20 at 8:15 am. Advised patient Dr. Quincy Simmonds will review, if any additional recommendations provided, RN will return call. Patient verbalizes understanding and is agreeable.   Routing to provider for final review. Patient is agreeable to disposition. Will close encounter.

## 2017-07-03 ENCOUNTER — Ambulatory Visit (INDEPENDENT_AMBULATORY_CARE_PROVIDER_SITE_OTHER): Payer: Medicare Other | Admitting: Obstetrics and Gynecology

## 2017-07-03 ENCOUNTER — Other Ambulatory Visit: Payer: Self-pay | Admitting: Obstetrics and Gynecology

## 2017-07-03 ENCOUNTER — Encounter: Payer: Self-pay | Admitting: Obstetrics and Gynecology

## 2017-07-03 ENCOUNTER — Ambulatory Visit (INDEPENDENT_AMBULATORY_CARE_PROVIDER_SITE_OTHER): Payer: Medicare Other

## 2017-07-03 VITALS — BP 144/94 | HR 96 | Resp 18 | Ht 63.0 in | Wt 123.0 lb

## 2017-07-03 DIAGNOSIS — N760 Acute vaginitis: Secondary | ICD-10-CM

## 2017-07-03 DIAGNOSIS — J309 Allergic rhinitis, unspecified: Secondary | ICD-10-CM | POA: Diagnosis not present

## 2017-07-03 DIAGNOSIS — Z9889 Other specified postprocedural states: Secondary | ICD-10-CM | POA: Diagnosis not present

## 2017-07-03 LAB — POCT URINALYSIS DIPSTICK
BILIRUBIN UA: NEGATIVE
Blood, UA: NEGATIVE
GLUCOSE UA: NEGATIVE
Ketones, UA: NEGATIVE
Nitrite, UA: NEGATIVE
Protein, UA: NEGATIVE
UROBILINOGEN UA: 0.2 U/dL
pH, UA: 7 (ref 5.0–8.0)

## 2017-07-03 NOTE — Progress Notes (Signed)
GYNECOLOGY  VISIT   HPI: 76 y.o.   Married  Caucasian  female   G1P1001 with Patient's last menstrual period was 12/15/1993 (approximate).   here for post op form 06/29/17 DILATATION & CURETTAGE/HYSTEROSCOPY WITH MYOSURE. C/o vaginal dryness.     Final pathology is benign endometrial polyp.  States she had some itching in her skin following IV narcotic.  She was treated with benadryl.  She had not rash.   Patient reporting vaginal dryness, burning, and pain yesterday.  Denies discharge.  No odor.   Usually uses vaginal estrogen cream for the last 11 days.  Has not used any coconut oil either.   Was seen for bacterial vaginosis and yeast vaginitis in May 2018.  Has Mycolog II cream at home and asking if this is appropriate.   Very little bleeding post op.   Some pelvic discomfort.  Some discomfort after voiding.  No fevers.   No nausea or vomiting.   GYNECOLOGIC HISTORY: Patient's last menstrual period was 12/15/1993 (approximate). Contraception:  Postmenopausal Menopausal hormone therapy:  none Last mammogram:  04/29/17 BIRADS 2 benign Last pap smear:   04/09/16 Pap Negative        OB History    Gravida Para Term Preterm AB Living   1 1 1  0 0 1   SAB TAB Ectopic Multiple Live Births   0 0 0 0 1         Patient Active Problem List   Diagnosis Date Noted  . Gastroesophageal reflux disease without esophagitis 01/07/2016  . Insect sting allergy, current reaction 01/07/2016  . Rhinitis, allergic 08/25/2015  . Recurrent ventral hernia 05/31/2014    Past Medical History:  Diagnosis Date  . Anemia    history of borderline anemia  . Arthritis    left thumb and lower back,  . Atypical glandular cells of undetermined significance (AGUS) on cervical Pap smear 12/1998   endo polyp- [name]  . Disc degeneration, lumbar 2016   L4, L5, S1  . Endocervical polyp 1997  . Environmental allergies   . GERD (gastroesophageal reflux disease)   . Headache     Sinusitis  . Hypertension   . Hyponatremia   . Interstitial cystitis 2004   mild  . Melanoma of face ([place]) 04/2007  . Mild dysplasia of cervix    laser  . MRSA infection    many yrs ago -right hand(tx. with oral antibiotic)-no further issues  . Osteopenia    intolerant of bisphosphonates  . 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 SIADH (syndrome of inappropriate ADH production) ([place]) 1998  . Urethral stenosis    dilated  . Varicose vein of leg     Past Surgical History:  Procedure Laterality Date  . COLONOSCOPY WITH PROPOFOL N/A 10/02/2015   Procedure: COLONOSCOPY WITH PROPOFOL;  Surgeon: Garlan Fair, MD;  Location: [facility];  Service: Endoscopy;  Laterality: N/A;  . DILATATION & CURETTAGE/HYSTEROSCOPY WITH MYOSURE N/A 06/29/2017   Procedure: DILATATION & CURETTAGE/HYSTEROSCOPY WITH MYOSURE;  Surgeon: Nunzio Cobbs, MD;  Location: [facility];  Service: Gynecology;  Laterality: N/A;  . HAND SURGERY     cyst removed from rt hand  . HERNIA REPAIR  2831, 04/15/75   umbilical mesh placed in August 2015.  Marland Kitchen HERNIA REPAIR  02-05-15   bilateral inguinal - mesh  . MELANOMA EXCISION Right    '08- right cheek- no futher issues  . varicose vein Left 2016   past years bilateral  .  WISDOM TOOTH EXTRACTION      Current Outpatient Prescriptions  Medication Sig Dispense Refill  . azelastine (ASTELIN) 0.1 % nasal spray 2 sprays per nostril twice for runny nose. 90 mL 3  . Calcium Citrate-Vitamin D (CITRACAL + D PO) Take 2 tablets by mouth 2 (two) times daily.     Marland Kitchen CHERRY PO Take 2 tablets by mouth 2 (two) times daily. Tart Cherry Extract     . cholecalciferol (VITAMIN D) 1000 UNITS tablet Take 1,000 Units by mouth daily.    . Eszopiclone (ESZOPICLONE) 3 MG TABS Take 3 mg by mouth at bedtime as needed (for sleep). Take immediately before bedtime     . Lactobacillus Rhamnosus, GG, (CULTURELLE PO) Take 1 tablet by mouth daily.     . Misc Natural Products (OSTEO BI-FLEX JOINT SHIELD PO)  Take 2 tablets by mouth daily.     . montelukast (SINGULAIR) 10 MG tablet TAKE 1 TABLET BY MOUTH EVERY EVENING TO PREVENT COUGH OR WHEEZE 90 tablet 1  . Multiple Vitamin (MULTIVITAMIN) capsule Take 1 capsule by mouth daily.    . OMEGA 3 1000 MG CAPS Take 1 capsule by mouth daily.     Marland Kitchen omeprazole (PRILOSEC) 20 MG capsule Take 20 mg by mouth 2 (two) times daily.     . Triamcinolone Acetonide (NASACORT AQ NA) Place 1 spray into the nose 2 (two) times daily.    Marland Kitchen triamterene-hydrochlorothiazide (MAXZIDE-25) 37.5-25 MG per tablet Take 0.5 tablets by mouth daily.    Marland Kitchen trolamine salicylate (ASPERCREME) 10 % cream Apply 1 application topically at bedtime as needed for muscle pain.     . valsartan (DIOVAN) 160 MG tablet Take 160 mg by mouth every evening.    Marland Kitchen EPINEPHrine (EPIPEN 2-PAK) 0.3 mg/0.3 mL IJ SOAJ injection Inject 0.3 mLs (0.3 mg total) into the muscle once. (Patient not taking: Reported on 07/03/2017) 2 Device 2  . estradiol (ESTRACE) 0.1 MG/GM vaginal cream Place 0.5 g vaginally. 2-3 times weekly  0  . ibuprofen (ADVIL,MOTRIN) 800 MG tablet Take 1 tablet (800 mg total) by mouth every 8 (eight) hours as needed for cramping. (Patient not taking: Reported on 07/03/2017) 30 tablet 0  . levocetirizine (XYZAL) 5 MG tablet Take 1 tablet (5 mg total) by mouth every evening. 30 tablet 11  . lidocaine (XYLOCAINE) 5 % ointment Apply 1 application topically 4 (four) times daily as needed. (Patient not taking: Reported on 07/03/2017) 30 g 0  . triamcinolone cream (KENALOG) 0.1 % Apply 1 application topically 2 (two) times daily as needed (for rash).      No current facility-administered medications for this visit.      ALLERGIES: Bee venom; Macrodantin [nitrofurantoin]; Chlorhexidine; Darvocet [propoxyphene n-acetaminophen]; Endocet [oxycodone-acetaminophen]; Floxin [ofloxacin]; Latex; Penetrex [enoxacin]; Sulfa antibiotics; and Hibiclens [chlorhexidine gluconate]  Family History  Problem Relation Age of  Onset  . Other Mother 65       MVA   . Lupus Sister 83  . Hypertension Father   . Osteoporosis Sister   . Thyroid disease Sister   . Scoliosis Sister   . Prostate cancer Brother   . Allergic rhinitis Neg Hx   . Angioedema Neg Hx   . Asthma Neg Hx   . Eczema Neg Hx   . Immunodeficiency Neg Hx   . Urticaria Neg Hx     Social History   Social History  . Marital status: Married    Spouse name: N/A  . Number of children: N/A  . Years  of education: N/A   Occupational History  . Not on file.   Social History Main Topics  . Smoking status: Never Smoker  . Smokeless tobacco: Never Used  . Alcohol use No  . Drug use: No  . Sexual activity: Yes    Partners: Male    Birth control/ protection: Post-menopausal     Comment: husband vasectomy   Other Topics Concern  . Not on file   Social History Narrative  . No narrative on file    ROS:  Pertinent items are noted in HPI.  PHYSICAL EXAMINATION:    BP (!) 144/94 (BP Location: Right Arm, Patient Position: Sitting, Cuff Size: Normal)   Pulse 96   Resp 18   Ht 5\' 3"  (1.6 m)   Wt 123 lb (55.8 kg)   LMP 12/15/1993 (Approximate)   BMI 21.79 kg/m     General appearance: alert, cooperative and appears stated age   Abdomen: soft, non-tender, no masses,  no organomegaly    Pelvic: External genitalia:  no lesions              Urethra:  normal appearing urethra with no masses, tenderness or lesions              Bartholins and Skenes: normal                 Vagina: normal appearing vagina with normal color and discharge, no lesions.  Minimal blood noted.              Cervix: no lesions.  No CMT.                 Bimanual Exam:  Uterus:  normal size, contour, position, consistency, mobility, non-tender              Adnexa: no mass, fullness, tenderness         Chaperone was present for exam.  ASSESSMENT  Vulvovaginitis. Status post hysteroscopic polypectomy, dilation and curettage for benign endometrial  polyp.  PLAN  Affirm.  Urine dip:  Negative.  OK to tx with Mycolog II externally.  Do not place anything vaginally until 1 week post op.  Discussed surgical findings, procedure, and pathology report.  Follow up prn.    An After Visit Summary was printed and given to the patient.  ___15___ minutes face to face time of which over 50% was spent in counseling.  This was not just a post op visit.  It was really a problem visit.

## 2017-07-03 NOTE — Addendum Note (Signed)
Addended by: Polly Cobia on: 07/03/2017 09:43 AM   Modules accepted: Orders

## 2017-07-04 LAB — VAGINITIS/VAGINOSIS, DNA PROBE
Candida Species: NEGATIVE
GARDNERELLA VAGINALIS: POSITIVE — AB
TRICHOMONAS VAG: NEGATIVE

## 2017-07-09 ENCOUNTER — Other Ambulatory Visit: Payer: Self-pay | Admitting: Obstetrics and Gynecology

## 2017-07-09 ENCOUNTER — Telehealth: Payer: Self-pay

## 2017-07-09 MED ORDER — METRONIDAZOLE 0.75 % VA GEL: 1.0000 | Freq: Every day | VAGINAL | 0 refills | Status: AC

## 2017-07-09 NOTE — Telephone Encounter (Signed)
Spoke with patient. Results given. Would like to use Metrogel. Rx for Metrogel pv at hs for 5 nights sent to pharmacy on file. Patient verbalizes understanding.

## 2017-07-09 NOTE — Telephone Encounter (Signed)
Left message to call Kaitlyn at [phone]. 

## 2017-07-09 NOTE — Telephone Encounter (Signed)
-----   Message from Nunzio Cobbs, MD sent at 07/09/2017  8:36 AM EDT ----- Please inform of Affirm result showing bacterial vaginosis. She may treat with Flagyl 500 mg po bid for 7 days or Metrogel pv at hs for 5 nights.  Please send Rx to pharmacy of choice. ETOH precautions.   I am sorry for the delay.  The result did not come through [facility] in a timely fashion.

## 2017-07-09 NOTE — Telephone Encounter (Signed)
Medication refill request: estradiol vag cream  Last AEX:  04/16/17 Dr. Quincy Simmonds  Next AEX: 04/22/18  Last MMG (if hormonal medication request): 04/29/17 BIRADS2:Benign  Refill authorized: please advise

## 2017-07-13 ENCOUNTER — Ambulatory Visit: Payer: Medicare Other | Admitting: Obstetrics and Gynecology

## 2017-07-13 ENCOUNTER — Ambulatory Visit (INDEPENDENT_AMBULATORY_CARE_PROVIDER_SITE_OTHER): Payer: Medicare Other

## 2017-07-13 DIAGNOSIS — J309 Allergic rhinitis, unspecified: Secondary | ICD-10-CM | POA: Diagnosis not present

## 2017-07-20 ENCOUNTER — Telehealth: Payer: Self-pay | Admitting: *Deleted

## 2017-07-20 NOTE — Telephone Encounter (Signed)
Message received from patient on voice mail requesting surgery notes be faxed to Dr Geanie Logan, PCP. She has appointment with him on 07-27-17.

## 2017-07-21 ENCOUNTER — Ambulatory Visit (INDEPENDENT_AMBULATORY_CARE_PROVIDER_SITE_OTHER): Payer: Medicare Other

## 2017-07-21 DIAGNOSIS — J309 Allergic rhinitis, unspecified: Secondary | ICD-10-CM | POA: Diagnosis not present

## 2017-07-21 NOTE — Telephone Encounter (Signed)
Operative report, pathology report and hospital labs sent to Dr gates office via fax with confirmation received.    Call to patient. Advised notes sent to PCP as requested.  Routing to provider for final review. Patient agreeable to disposition. Will close encounter.

## 2017-07-28 ENCOUNTER — Ambulatory Visit (INDEPENDENT_AMBULATORY_CARE_PROVIDER_SITE_OTHER): Payer: Medicare Other | Admitting: *Deleted

## 2017-07-28 DIAGNOSIS — T63441D Toxic effect of venom of bees, accidental (unintentional), subsequent encounter: Secondary | ICD-10-CM | POA: Diagnosis not present

## 2017-07-31 DIAGNOSIS — G609 Hereditary and idiopathic neuropathy, unspecified: Secondary | ICD-10-CM | POA: Diagnosis not present

## 2017-07-31 DIAGNOSIS — K439 Ventral hernia without obstruction or gangrene: Secondary | ICD-10-CM | POA: Diagnosis not present

## 2017-07-31 DIAGNOSIS — K219 Gastro-esophageal reflux disease without esophagitis: Secondary | ICD-10-CM | POA: Diagnosis not present

## 2017-07-31 DIAGNOSIS — Z1389 Encounter for screening for other disorder: Secondary | ICD-10-CM | POA: Diagnosis not present

## 2017-07-31 DIAGNOSIS — E559 Vitamin D deficiency, unspecified: Secondary | ICD-10-CM | POA: Diagnosis not present

## 2017-07-31 DIAGNOSIS — I10 Essential (primary) hypertension: Secondary | ICD-10-CM | POA: Diagnosis not present

## 2017-07-31 DIAGNOSIS — I83811 Varicose veins of right lower extremities with pain: Secondary | ICD-10-CM | POA: Diagnosis not present

## 2017-07-31 DIAGNOSIS — Z0001 Encounter for general adult medical examination with abnormal findings: Secondary | ICD-10-CM | POA: Diagnosis not present

## 2017-07-31 DIAGNOSIS — J309 Allergic rhinitis, unspecified: Secondary | ICD-10-CM | POA: Diagnosis not present

## 2017-07-31 DIAGNOSIS — E782 Mixed hyperlipidemia: Secondary | ICD-10-CM | POA: Diagnosis not present

## 2017-07-31 DIAGNOSIS — G4700 Insomnia, unspecified: Secondary | ICD-10-CM | POA: Diagnosis not present

## 2017-07-31 DIAGNOSIS — Z79899 Other long term (current) drug therapy: Secondary | ICD-10-CM | POA: Diagnosis not present

## 2017-08-03 ENCOUNTER — Ambulatory Visit (INDEPENDENT_AMBULATORY_CARE_PROVIDER_SITE_OTHER): Payer: Medicare Other | Admitting: *Deleted

## 2017-08-03 DIAGNOSIS — J309 Allergic rhinitis, unspecified: Secondary | ICD-10-CM | POA: Diagnosis not present

## 2017-08-10 ENCOUNTER — Ambulatory Visit (INDEPENDENT_AMBULATORY_CARE_PROVIDER_SITE_OTHER): Payer: Medicare Other

## 2017-08-10 DIAGNOSIS — J309 Allergic rhinitis, unspecified: Secondary | ICD-10-CM | POA: Diagnosis not present

## 2017-08-11 DIAGNOSIS — I83813 Varicose veins of bilateral lower extremities with pain: Secondary | ICD-10-CM | POA: Diagnosis not present

## 2017-08-11 DIAGNOSIS — M79604 Pain in right leg: Secondary | ICD-10-CM | POA: Diagnosis not present

## 2017-08-11 DIAGNOSIS — M79605 Pain in left leg: Secondary | ICD-10-CM | POA: Diagnosis not present

## 2017-08-20 ENCOUNTER — Ambulatory Visit (INDEPENDENT_AMBULATORY_CARE_PROVIDER_SITE_OTHER): Payer: Medicare Other | Admitting: *Deleted

## 2017-08-20 DIAGNOSIS — J309 Allergic rhinitis, unspecified: Secondary | ICD-10-CM | POA: Diagnosis not present

## 2017-08-21 DIAGNOSIS — I83813 Varicose veins of bilateral lower extremities with pain: Secondary | ICD-10-CM | POA: Diagnosis not present

## 2017-08-22 ENCOUNTER — Inpatient Hospital Stay (HOSPITAL_COMMUNITY)
Admission: EM | Admit: 2017-08-22 | Discharge: 2017-09-14 | DRG: 064 | Disposition: E | Payer: Medicare Other | Attending: Neurology | Admitting: Neurology

## 2017-08-22 ENCOUNTER — Emergency Department (HOSPITAL_COMMUNITY): Payer: Medicare Other

## 2017-08-22 ENCOUNTER — Encounter (HOSPITAL_COMMUNITY): Payer: Self-pay | Admitting: *Deleted

## 2017-08-22 DIAGNOSIS — Z881 Allergy status to other antibiotic agents status: Secondary | ICD-10-CM

## 2017-08-22 DIAGNOSIS — I161 Hypertensive emergency: Secondary | ICD-10-CM | POA: Diagnosis present

## 2017-08-22 DIAGNOSIS — Z4682 Encounter for fitting and adjustment of non-vascular catheter: Secondary | ICD-10-CM | POA: Diagnosis not present

## 2017-08-22 DIAGNOSIS — E871 Hypo-osmolality and hyponatremia: Secondary | ICD-10-CM

## 2017-08-22 DIAGNOSIS — J9690 Respiratory failure, unspecified, unspecified whether with hypoxia or hypercapnia: Secondary | ICD-10-CM | POA: Diagnosis not present

## 2017-08-22 DIAGNOSIS — Z515 Encounter for palliative care: Secondary | ICD-10-CM | POA: Diagnosis not present

## 2017-08-22 DIAGNOSIS — Z886 Allergy status to analgesic agent status: Secondary | ICD-10-CM

## 2017-08-22 DIAGNOSIS — Z8042 Family history of malignant neoplasm of prostate: Secondary | ICD-10-CM | POA: Diagnosis not present

## 2017-08-22 DIAGNOSIS — D72829 Elevated white blood cell count, unspecified: Secondary | ICD-10-CM

## 2017-08-22 DIAGNOSIS — R739 Hyperglycemia, unspecified: Secondary | ICD-10-CM | POA: Diagnosis present

## 2017-08-22 DIAGNOSIS — I361 Nonrheumatic tricuspid (valve) insufficiency: Secondary | ICD-10-CM | POA: Diagnosis not present

## 2017-08-22 DIAGNOSIS — Z882 Allergy status to sulfonamides status: Secondary | ICD-10-CM

## 2017-08-22 DIAGNOSIS — I619 Nontraumatic intracerebral hemorrhage, unspecified: Secondary | ICD-10-CM | POA: Diagnosis not present

## 2017-08-22 DIAGNOSIS — G8194 Hemiplegia, unspecified affecting left nondominant side: Secondary | ICD-10-CM | POA: Diagnosis present

## 2017-08-22 DIAGNOSIS — Z8614 Personal history of Methicillin resistant Staphylococcus aureus infection: Secondary | ICD-10-CM

## 2017-08-22 DIAGNOSIS — E876 Hypokalemia: Secondary | ICD-10-CM

## 2017-08-22 DIAGNOSIS — S098XXA Other specified injuries of head, initial encounter: Secondary | ICD-10-CM | POA: Diagnosis not present

## 2017-08-22 DIAGNOSIS — G919 Hydrocephalus, unspecified: Secondary | ICD-10-CM

## 2017-08-22 DIAGNOSIS — Z885 Allergy status to narcotic agent status: Secondary | ICD-10-CM

## 2017-08-22 DIAGNOSIS — S199XXA Unspecified injury of neck, initial encounter: Secondary | ICD-10-CM | POA: Diagnosis not present

## 2017-08-22 DIAGNOSIS — Z978 Presence of other specified devices: Secondary | ICD-10-CM | POA: Insufficient documentation

## 2017-08-22 DIAGNOSIS — Z888 Allergy status to other drugs, medicaments and biological substances status: Secondary | ICD-10-CM | POA: Diagnosis not present

## 2017-08-22 DIAGNOSIS — R000 Tachycardia, unspecified: Secondary | ICD-10-CM | POA: Diagnosis not present

## 2017-08-22 DIAGNOSIS — G9349 Other encephalopathy: Secondary | ICD-10-CM | POA: Diagnosis present

## 2017-08-22 DIAGNOSIS — J9600 Acute respiratory failure, unspecified whether with hypoxia or hypercapnia: Secondary | ICD-10-CM

## 2017-08-22 DIAGNOSIS — G911 Obstructive hydrocephalus: Secondary | ICD-10-CM | POA: Diagnosis present

## 2017-08-22 DIAGNOSIS — I612 Nontraumatic intracerebral hemorrhage in hemisphere, unspecified: Secondary | ICD-10-CM

## 2017-08-22 DIAGNOSIS — I6789 Other cerebrovascular disease: Secondary | ICD-10-CM | POA: Diagnosis present

## 2017-08-22 DIAGNOSIS — Z66 Do not resuscitate: Secondary | ICD-10-CM | POA: Diagnosis not present

## 2017-08-22 DIAGNOSIS — G936 Cerebral edema: Secondary | ICD-10-CM | POA: Diagnosis present

## 2017-08-22 DIAGNOSIS — I618 Other nontraumatic intracerebral hemorrhage: Principal | ICD-10-CM | POA: Diagnosis present

## 2017-08-22 DIAGNOSIS — Z8249 Family history of ischemic heart disease and other diseases of the circulatory system: Secondary | ICD-10-CM | POA: Diagnosis not present

## 2017-08-22 DIAGNOSIS — R29818 Other symptoms and signs involving the nervous system: Secondary | ICD-10-CM | POA: Diagnosis not present

## 2017-08-22 DIAGNOSIS — Z91040 Latex allergy status: Secondary | ICD-10-CM

## 2017-08-22 DIAGNOSIS — S0993XA Unspecified injury of face, initial encounter: Secondary | ICD-10-CM | POA: Diagnosis not present

## 2017-08-22 DIAGNOSIS — Z85820 Personal history of malignant melanoma of skin: Secondary | ICD-10-CM

## 2017-08-22 DIAGNOSIS — R402343 Coma scale, best motor response, flexion withdrawal, at hospital admission: Secondary | ICD-10-CM | POA: Diagnosis present

## 2017-08-22 DIAGNOSIS — I610 Nontraumatic intracerebral hemorrhage in hemisphere, subcortical: Secondary | ICD-10-CM | POA: Diagnosis not present

## 2017-08-22 DIAGNOSIS — I10 Essential (primary) hypertension: Secondary | ICD-10-CM | POA: Diagnosis present

## 2017-08-22 DIAGNOSIS — R29810 Facial weakness: Secondary | ICD-10-CM | POA: Diagnosis present

## 2017-08-22 DIAGNOSIS — R402213 Coma scale, best verbal response, none, at hospital admission: Secondary | ICD-10-CM | POA: Diagnosis present

## 2017-08-22 DIAGNOSIS — Z7189 Other specified counseling: Secondary | ICD-10-CM | POA: Diagnosis not present

## 2017-08-22 DIAGNOSIS — Z91030 Bee allergy status: Secondary | ICD-10-CM | POA: Diagnosis not present

## 2017-08-22 DIAGNOSIS — I615 Nontraumatic intracerebral hemorrhage, intraventricular: Secondary | ICD-10-CM | POA: Diagnosis present

## 2017-08-22 DIAGNOSIS — K219 Gastro-esophageal reflux disease without esophagitis: Secondary | ICD-10-CM | POA: Diagnosis present

## 2017-08-22 DIAGNOSIS — R402113 Coma scale, eyes open, never, at hospital admission: Secondary | ICD-10-CM | POA: Diagnosis present

## 2017-08-22 DIAGNOSIS — R404 Transient alteration of awareness: Secondary | ICD-10-CM | POA: Diagnosis not present

## 2017-08-22 LAB — COMPREHENSIVE METABOLIC PANEL
ALBUMIN: 3.8 g/dL (ref 3.5–5.0)
ALK PHOS: 64 U/L (ref 38–126)
ALT: 28 U/L (ref 14–54)
ANION GAP: 9 (ref 5–15)
AST: 30 U/L (ref 15–41)
BILIRUBIN TOTAL: 0.4 mg/dL (ref 0.3–1.2)
BUN: 11 mg/dL (ref 6–20)
CALCIUM: 8.4 mg/dL — AB (ref 8.9–10.3)
CO2: 24 mmol/L (ref 22–32)
Chloride: 91 mmol/L — ABNORMAL LOW (ref 101–111)
Creatinine, Ser: 0.45 mg/dL (ref 0.44–1.00)
GFR calc Af Amer: >60 mL/min (ref >60)
GLUCOSE: 154 mg/dL — AB (ref 65–99)
Potassium: 3 mmol/L — ABNORMAL LOW (ref 3.5–5.1)
Sodium: 124 mmol/L — ABNORMAL LOW (ref 135–145)
TOTAL PROTEIN: 6.7 g/dL (ref 6.5–8.1)

## 2017-08-22 LAB — DIFFERENTIAL
Basophils Absolute: 0.1 10*3/uL (ref 0.0–0.1)
Basophils Relative: 1 %
Eosinophils Absolute: 0.1 10*3/uL (ref 0.0–0.7)
Eosinophils Relative: 1 %
LYMPHS ABS: 1.1 10*3/uL (ref 0.7–4.0)
LYMPHS PCT: 16 %
MONOS PCT: 7 %
Monocytes Absolute: 0.5 10*3/uL (ref 0.1–1.0)
Neutro Abs: 5 10*3/uL (ref 1.7–7.7)
Neutrophils Relative %: 75 %

## 2017-08-22 LAB — I-STAT CHEM 8, ED
BUN: 12 mg/dL (ref 6–20)
CREATININE: 0.4 mg/dL — AB (ref 0.44–1.00)
Calcium, Ion: 1.03 mmol/L — ABNORMAL LOW (ref 1.15–1.40)
Chloride: 91 mmol/L — ABNORMAL LOW (ref 101–111)
Glucose, Bld: 150 mg/dL — ABNORMAL HIGH (ref 65–99)
HEMATOCRIT: 39 % (ref 36.0–46.0)
Hemoglobin: 13.3 g/dL (ref 12.0–15.0)
POTASSIUM: 3.1 mmol/L — AB (ref 3.5–5.1)
Sodium: 128 mmol/L — ABNORMAL LOW (ref 135–145)
TCO2: 24 mmol/L (ref 22–32)

## 2017-08-22 LAB — I-STAT ARTERIAL BLOOD GAS, ED
Acid-Base Excess: 4 mmol/L — ABNORMAL HIGH (ref 0.0–2.0)
BICARBONATE: 29.7 mmol/L — AB (ref 20.0–28.0)
O2 SAT: 100 %
TCO2: 31 mmol/L (ref 22–32)
pCO2 arterial: 43.4 mmHg (ref 32.0–48.0)
pH, Arterial: 7.436 (ref 7.350–7.450)
pO2, Arterial: 495 mmHg — ABNORMAL HIGH (ref 83.0–108.0)

## 2017-08-22 LAB — PROTIME-INR
INR: 0.93
Prothrombin Time: 12.3 seconds (ref 11.4–15.2)

## 2017-08-22 LAB — CBG MONITORING, ED: Glucose-Capillary: 162 mg/dL — ABNORMAL HIGH (ref 65–99)

## 2017-08-22 LAB — CBC
HEMATOCRIT: 36.2 % (ref 36.0–46.0)
HEMOGLOBIN: 12.6 g/dL (ref 12.0–15.0)
MCH: 31.5 pg (ref 26.0–34.0)
MCHC: 34.8 g/dL (ref 30.0–36.0)
MCV: 90.5 fL (ref 78.0–100.0)
Platelets: 310 10*3/uL (ref 150–400)
RBC: 4 MIL/uL (ref 3.87–5.11)
RDW: 13.1 % (ref 11.5–15.5)
WBC: 6.6 10*3/uL (ref 4.0–10.5)

## 2017-08-22 LAB — I-STAT TROPONIN, ED: TROPONIN I, POC: 0 ng/mL (ref 0.00–0.08)

## 2017-08-22 LAB — APTT: aPTT: 28 seconds (ref 24–36)

## 2017-08-22 MED ORDER — MIDAZOLAM HCL 2 MG/2ML IJ SOLN
INTRAMUSCULAR | Status: AC
  Administered 2017-08-22: 2 mg
  Filled 2017-08-22: qty 4

## 2017-08-22 MED ORDER — ETOMIDATE 2 MG/ML IV SOLN
INTRAVENOUS | Status: DC | PRN
  Administered 2017-08-22: 15 mg via INTRAVENOUS

## 2017-08-22 MED ORDER — LABETALOL HCL 5 MG/ML IV SOLN
INTRAVENOUS | Status: AC
  Administered 2017-08-22: 20 mg
  Filled 2017-08-22: qty 4

## 2017-08-22 MED ORDER — PROPOFOL 1000 MG/100ML IV EMUL
INTRAVENOUS | Status: AC
  Administered 2017-08-22: 20 ug/kg/min
  Filled 2017-08-22: qty 100

## 2017-08-22 MED ORDER — POTASSIUM CHLORIDE 10 MEQ/100ML IV SOLN
10.0000 meq | INTRAVENOUS | Status: AC
  Administered 2017-08-22 – 2017-08-23 (×4): 10 meq via INTRAVENOUS
  Filled 2017-08-22 (×4): qty 100

## 2017-08-22 MED ORDER — SODIUM CHLORIDE 0.9 % IV SOLN
INTRAVENOUS | Status: DC
  Administered 2017-08-22 – 2017-08-25 (×3): via INTRAVENOUS

## 2017-08-22 MED ORDER — SUCCINYLCHOLINE CHLORIDE 20 MG/ML IJ SOLN
INTRAMUSCULAR | Status: DC | PRN
  Administered 2017-08-22: 75 mg via INTRAVENOUS

## 2017-08-22 MED ORDER — NICARDIPINE HCL IN NACL 20-0.86 MG/200ML-% IV SOLN
3.0000 mg/h | INTRAVENOUS | Status: DC
  Administered 2017-08-22: 5 mg/h via INTRAVENOUS

## 2017-08-22 MED ORDER — NICARDIPINE HCL IN NACL 20-0.86 MG/200ML-% IV SOLN
3.0000 mg/h | INTRAVENOUS | Status: DC
  Administered 2017-08-23 (×2): 5 mg/h via INTRAVENOUS
  Administered 2017-08-23: 2.5 mg/h via INTRAVENOUS
  Filled 2017-08-22 (×4): qty 200

## 2017-08-22 MED ORDER — NICARDIPINE HCL IN NACL 20-0.86 MG/200ML-% IV SOLN
INTRAVENOUS | Status: AC
  Filled 2017-08-22: qty 200

## 2017-08-22 MED ORDER — STROKE: EARLY STAGES OF RECOVERY BOOK
Freq: Once | Status: AC
  Administered 2017-08-22: 23:00:00
  Filled 2017-08-22: qty 1

## 2017-08-22 MED ORDER — SENNOSIDES-DOCUSATE SODIUM 8.6-50 MG PO TABS
1.0000 | ORAL_TABLET | Freq: Two times a day (BID) | ORAL | Status: DC
  Administered 2017-08-23 – 2017-08-27 (×9): 1 via ORAL
  Filled 2017-08-22 (×10): qty 1

## 2017-08-22 MED ORDER — PROPOFOL 1000 MG/100ML IV EMUL
5.0000 ug/kg/min | INTRAVENOUS | Status: DC
  Administered 2017-08-23: 30 ug/kg/min via INTRAVENOUS
  Administered 2017-08-23: 60 ug/kg/min via INTRAVENOUS
  Administered 2017-08-24: 30 ug/kg/min via INTRAVENOUS
  Administered 2017-08-24 (×2): 25 ug/kg/min via INTRAVENOUS
  Administered 2017-08-25: 35 ug/kg/min via INTRAVENOUS
  Filled 2017-08-22 (×4): qty 100

## 2017-08-22 MED ORDER — PANTOPRAZOLE SODIUM 40 MG IV SOLR
40.0000 mg | Freq: Every day | INTRAVENOUS | Status: DC
  Administered 2017-08-23 – 2017-08-27 (×6): 40 mg via INTRAVENOUS
  Filled 2017-08-22 (×7): qty 40

## 2017-08-22 MED ORDER — INSULIN ASPART 100 UNIT/ML ~~LOC~~ SOLN
1.0000 [IU] | SUBCUTANEOUS | Status: DC
  Administered 2017-08-22 – 2017-08-24 (×5): 1 [IU] via SUBCUTANEOUS
  Administered 2017-08-24: 2 [IU] via SUBCUTANEOUS
  Administered 2017-08-24 – 2017-08-25 (×3): 1 [IU] via SUBCUTANEOUS
  Administered 2017-08-25: 2 [IU] via SUBCUTANEOUS
  Administered 2017-08-25 – 2017-08-27 (×13): 1 [IU] via SUBCUTANEOUS
  Administered 2017-08-27: 2 [IU] via SUBCUTANEOUS
  Administered 2017-08-27: 1 [IU] via SUBCUTANEOUS
  Administered 2017-08-28: 2 [IU] via SUBCUTANEOUS
  Administered 2017-08-28 (×2): 1 [IU] via SUBCUTANEOUS

## 2017-08-22 MED ORDER — MIDAZOLAM HCL 2 MG/2ML IJ SOLN
2.0000 mg | INTRAMUSCULAR | Status: DC | PRN
  Administered 2017-08-22 (×2): 2 mg via INTRAVENOUS
  Filled 2017-08-22: qty 2

## 2017-08-22 NOTE — H&P (Signed)
Neurology H&P  CC: Left-sided weakness  History is obtained from: Husband  HPI: Teresa Morales is a 76 y.o. female was last in her normal state of health just prior to 6:30 PM when he heard her fall. He went in and try to help her up, but she was unable to get up and wanted to take some time do get her strength back. When he tried to get her up again a little bit later he noticed that she was having trouble pushing up with her left arm and it was at that point that he decided to call 911.   LKW: 6:30 PM tpa given?: no, ICH ICH Score: 2  ROS: Unable to obtain due to altered mental status.   Past Medical History:  Diagnosis Date  . Anemia    history of borderline anemia  . Arthritis    left thumb and lower back,  . Atypical glandular cells of undetermined significance (AGUS) on cervical Pap smear 12/1998   endo polyp- [name]  . Disc degeneration, lumbar 2016   L4, L5, S1  . Endocervical polyp 1997  . Environmental allergies   . GERD (gastroesophageal reflux disease)   . Headache    Sinusitis  . Hypertension   . Hyponatremia   . Interstitial cystitis 2004   mild  . Melanoma of face ([place]) 04/2007  . Mild dysplasia of cervix    laser  . MRSA infection    many yrs ago -right hand(tx. with oral antibiotic)-no further issues  . Osteopenia    intolerant of bisphosphonates  . 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 Urethral stenosis    dilated  . Varicose vein of leg      Family History  Problem Relation Age of Onset  . Other Mother 38       MVA   . Lupus Sister 99  . Hypertension Father   . Osteoporosis Sister   . Thyroid disease Sister   . Scoliosis Sister   . Prostate cancer Brother   . Allergic rhinitis Neg Hx   . Angioedema Neg Hx   . Asthma Neg Hx   . Eczema Neg Hx   . Immunodeficiency Neg Hx   . Urticaria Neg Hx      Social History:  reports that she has never smoked. She has never used  smokeless tobacco. She reports that she does not drink alcohol or use drugs.   Exam: Current vital signs: BP 137/88   Pulse 86   Resp (!) 22   Ht 5\' 3"  (1.6 m)   Wt 57.8 kg (127 lb 6.8 oz)   LMP 12/15/1993 (Approximate)   SpO2 100%   BMI 22.57 kg/m  Vital signs in last 24 hours: Pulse Rate:  [73-86] 86 (09/08 2109) Resp:  [14-34] 22 (09/08 2109) BP: (137-220)/(87-131) 137/88 (09/08 2109) SpO2:  [98 %-100 %] 100 % (09/08 2109) FiO2 (%):  [100 %] 100 % (09/08 2030) Weight:  [57.8 kg (127 lb 6.8 oz)] 57.8 kg (127 lb 6.8 oz) (09/08 2027)  Physical Exam  Constitutional: Appears well-developed and well-nourished.  Psych: Affect appropriate to situation Eyes: No scleral injection HENT: Mild laceration over the left eye Head: Normocephalic.  Cardiovascular: Normal rate and regular rhythm.  Respiratory: Effort normal and breath sounds normal to anterior ascultation GI: Soft.  No distension. There is no tenderness.  Skin: WDI  Neuro: Mental Status: Patient is obtunded, but does arouse  to stimuli, she is able to give me a thumbs up to commands, but does not follow commands consistently. Her speech is incomprehensible. Cranial Nerves: II: She does not blink to threat from either direction. Pupils are equal, round, and reactive to light.   III,IV, VI: She has a slightly disconjugate gaze, but both arise or looking towards the right, does not cross midline to the left V,VII: She blinks to eyelid stimulation bilaterally VIII: hearing is intact to voice X, XI, XII: Unable to assess secondary to patient's altered mental status.  Motor: Tone is mildly increased in the right arm Bulk is normal. 5/5 strength was present on the right side, on the left she clearly has hemiparesis with minimal movement in both the arm and leg to noxious stimulus Sensory: She does respond to noxious stimulus in all 4 extremities Cerebellar: She does not perform   I have reviewed labs in epic and the results  pertinent to this consultation are: Hyponatremia at 124, hypokalemia at 3.0  I have reviewed the images obtained: CT head-chronic hemorrhage with intraventricular extension, including blood in the fourth ventricle.  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 With blood in the fourth ventricle, she is at risk of developing hydrocephalus, and she will need serial imaging as well as close monitoring.  Recommendations: 1) Admit to ICU 2) no antiplatelets or anticoagulants 3) blood pressure control with goal systolic 130 - 865 4) Frequent neuro checks 5) If symptoms worsen or there is decreased mental status, repeat stat head CT 6) PT,OT,ST 7) repeat head CT in the a.m. 8) if there are any signs of hydrocephalus, neurosurgical consultation for consideration of EVD.  9) normal saline for hyponatremia, recheck in the morning 10) replete potassium 11) cardene for BP control.    This patient is critically ill and at significant risk of neurological worsening, death and care requires constant monitoring of vital signs, hemodynamics,respiratory and cardiac monitoring, neurological assessment, discussion with family, other specialists and medical decision making of high complexity. I spent 65 minutes of neurocritical care time  in the care of  this patient.  Roland Rack, MD [facility] [phone]  If 7pm- 7am, please page neurology on call as listed in [place]. 08/16/2017  9:25 PM

## 2017-08-22 NOTE — Consult Note (Signed)
PULMONARY / CRITICAL CARE MEDICINE   Name: Teresa Morales MRN: [id number] DOB: 01-19-1941    ADMISSION DATE:   CONSULTATION DATE:  08/16/2017   REFERRING MD:  Leonel Ramsay  CHIEF COMPLAINT:  Left sided weakness   HISTORY OF PRESENT ILLNESS:   76 yr old lady with recent diagnosis of HTN was well until tonight at 6:30pm when she allof a sudden fell in her room and hit her head. She was complaining of headache and then started having left sided weakness. Husband attended to her immediately and called 911. On arrival to the ED there was concern about her protecting her airway so she was intubated after calling code stroke. CT head showed right thalamic hemorrhage with ventricular extension.   As per husband she is not on any blood thinners. She was just recently started on antihypertensive and her BP has been always 140 or below.    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  PAST SURGICAL HISTORY: She  has a past surgical history that includes Hernia repair (1995, 07/18/14); Wisdom tooth extraction; Hernia repair (02-05-15); varicose vein (Left, 2016); Hand surgery; Melanoma excision (Right); Colonoscopy with propofol (N/A, 10/02/2015); and Dilatation & curettage/hysteroscopy with myosure (N/A, 06/29/2017).  Allergies  Allergen Reactions  . Bee Venom Swelling    Reaction to yellow jackets  . Macrodantin [Nitrofurantoin] Hives and Shortness Of Breath  .  Chlorhexidine Itching    Redness  . Darvocet [Propoxyphene N-Acetaminophen] Hives and Itching    Tolerates acetaminophen  . Endocet [Oxycodone-Acetaminophen] Hives and Itching    Tolerates acetaminophen  . Floxin [Ofloxacin] Itching  . Latex Itching  . Penetrex [Enoxacin] Itching  . Sulfa Antibiotics Other (See Comments)    Unknown - childhood allergy   . Hibiclens [Chlorhexidine Gluconate] Rash    Local reaction with previous surgery.     No current facility-administered medications on file prior to encounter.    Current Outpatient Prescriptions on File Prior to Encounter  Medication Sig  . azelastine (ASTELIN) 0.1 % nasal spray 2 sprays per nostril twice for runny nose. (Patient taking differently: Place 1-2 sprays into both nostrils See admin instructions. Instill 1-2 sprays into each nare once or twice daily for runny nose.)  . cholecalciferol (VITAMIN D) 1000 UNITS tablet Take 1,000 Units by mouth daily.  Marland Kitchen EPINEPHrine (EPIPEN 2-PAK) 0.3 mg/0.3 mL IJ SOAJ injection Inject 0.3 mLs (0.3 mg total) into the muscle once. (Patient taking differently: Inject 0.3 mg into the muscle once as needed (severe allergic reaction). )  . estradiol (ESTRACE) 0.1 MG/GM vaginal cream USE 0.5 GRAMS VAGINALLY 2 TO 3 TIMES EVERY WEEK  . Eszopiclone (ESZOPICLONE) 3 MG TABS Take 3 mg by mouth at bedtime as needed (for sleep). Take immediately before bedtime (Lunesta)  . Misc Natural Products (OSTEO BI-FLEX JOINT SHIELD PO) Take 2 tablets by mouth daily.   . montelukast (SINGULAIR) 10 MG tablet TAKE 1 TABLET BY MOUTH EVERY EVENING TO PREVENT COUGH OR WHEEZE (Patient taking differently: TAKE 1 TABLET (10 MG) BY MOUTH EVERY EVENING TO PREVENT COUGH OR WHEEZE)  . omeprazole (PRILOSEC) 20 MG  capsule Take 20 mg by mouth 2 (two) times daily.   Marland Kitchen triamcinolone cream (KENALOG) 0.1 % Apply 1 application topically 2 (two) times daily as needed (for rash).   . triamterene-hydrochlorothiazide (MAXZIDE-25) 37.5-25 MG per  tablet Take 0.5 tablets by mouth daily.  Marland Kitchen trolamine salicylate (ASPERCREME) 10 % cream Apply 1 application topically at bedtime as needed for muscle pain.   . valsartan (DIOVAN) 160 MG tablet Take 160 mg by mouth every evening.  Marland Kitchen ibuprofen (ADVIL,MOTRIN) 800 MG tablet Take 1 tablet (800 mg total) by mouth every 8 (eight) hours as needed for cramping. (Patient not taking: Reported on 07/03/2017)  . lidocaine (XYLOCAINE) 5 % ointment Apply 1 application topically 4 (four) times daily as needed. (Patient not taking: Reported on 07/03/2017)  . metroNIDAZOLE (METROGEL) 0.75 % vaginal gel Place 1 Applicatorful vaginally at bedtime. X 5 nights (Patient not taking: Reported on 08/17/2017)    FAMILY HISTORY:  Her indicated that her mother is deceased. She indicated that her father is deceased. She indicated that only one of her two sisters is alive. She indicated that the status of her brother is unknown. She indicated that the status of her neg hx is unknown.    SOCIAL HISTORY: She  reports that she has never smoked. She has never used smokeless tobacco. She reports that she does not drink alcohol or use drugs.  REVIEW OF SYSTEMS:   Could not be obtained due to [place]: BP (!) 148/87   Pulse 82   Temp (!) 96.1 F (35.6 C)   Resp 19   Ht 5\' 3"  (1.6 m)   Wt 57.8 kg (127 lb 6.8 oz)   LMP 12/15/1993 (Approximate)   SpO2 100%   BMI 22.57 kg/m   HEMODYNAMICS:    VENTILATOR SETTINGS: Vent Mode: PRVC FiO2 (%):  [100 %] 100 % Set Rate:  [16 bmp] 16 bmp Vt Set:  [500 mL] 500 mL PEEP:  [5 cmH20] 5 cmH20 Plateau Pressure:  [16 cmH20] 16 cmH20  INTAKE / OUTPUT: No intake/output data recorded.  PHYSICAL EXAMINATION: General: sedated intubated with with cut on left orbit Neuro:  Moving right side spontaneously, not following commands, extension of left leg and arm HEENT:  Intubated  Cardiovascular:  Normal heart sounds no murmurs Lungs:  Clear equal air sounds bilaterally no  wheezing  Abdomen: soft no tenderness  Musculoskeletal:  No edema Skin:  Cut over her left orbit   LABS:  BMET  Recent Labs Lab 08/24/2017 2019 09/12/2017 2026  NA 124* 128*  K 3.0* 3.1*  CL 91* 91*  CO2 24  --   BUN 11 12  CREATININE 0.45 0.40*  GLUCOSE 154* 150*    Electrolytes  Recent Labs Lab 08/18/2017 2019  CALCIUM 8.4*    CBC  Recent Labs Lab 08/18/2017 2019 09/05/2017 2026  WBC 6.6  --   HGB 12.6 13.3  HCT 36.2 39.0  PLT 310  --     Coag's  Recent Labs Lab 08/19/2017 2019  APTT 28  INR 0.93    Sepsis Markers No results for input(s): LATICACIDVEN, PROCALCITON, O2SATVEN in the last 168 hours.  ABG No results for input(s): PHART, PCO2ART, PO2ART in the last 168 hours.  Liver Enzymes  Recent Labs Lab 09/12/2017 2019  AST 30  ALT 28  ALKPHOS 64  BILITOT 0.4  ALBUMIN 3.8    Cardiac Enzymes No results for input(s): TROPONINI, PROBNP in the last 168 hours.  Glucose  Recent  Labs Lab 09/03/2017 2018  GLUCAP 162*    Imaging Ct Cervical Spine Wo Contrast  Result Date: 08/31/2017 CLINICAL DATA:  Code stroke leading to fall. EXAM: CT CERVICAL SPINE WITHOUT CONTRAST TECHNIQUE: Multidetector CT imaging of the cervical spine was performed without intravenous contrast. Multiplanar CT image reconstructions were also generated. COMPARISON:  Head CT performed concurrently and reported separately. FINDINGS: Alignment: Normal. Skull base and vertebrae: No acute fracture. Vertebral body heights are maintained. The dens and skull base are intact. Soft tissues and spinal canal: None subarachnoid hemorrhage does not extend into the cervical canal. No prevertebral soft tissue edema. Disc levels: Disc space narrowing and endplate spurring at Z6-X0 and to a lesser extent C6-C7. Upper chest: Mild biapical pleuroparenchymal scarring. Endotracheal tube in place. Other: None. IMPRESSION: Mild degenerative disc disease in the cervical spine without acute fracture or  subluxation. Electronically Signed   By: Jeb Levering M.D.   On: 08/30/2017 21:06   Dg Chest Portable 1 View  Result Date: 09/08/2017 CLINICAL DATA:  Intubation EXAM: PORTABLE CHEST 1 VIEW COMPARISON:  Portable exam 2056 hours without priors for comparison FINDINGS: Tip of endotracheal tube projects 5.1 cm above carina. Orogastric tube coiled in proximal stomach. Enlargement of cardiac silhouette. Mediastinal contours and pulmonary vascularity normal. Calcified adenopathy at RIGHT hilum. Lungs grossly clear. No infiltrate, pleural effusion or pneumothorax. Bones demineralized. IMPRESSION: Tube positions as above. Old granulomatous disease. No acute abnormalities. Electronically Signed   By: Lavonia Dana M.D.   On: 08/21/2017 21:02   Ct Head Code Stroke Wo Contrast  Result Date: 09/07/2017 CLINICAL DATA:  Code stroke.  Left-sided weakness. EXAM: CT HEAD WITHOUT CONTRAST TECHNIQUE: Contiguous axial images were obtained from the base of the skull through the vertex without intravenous contrast. COMPARISON:  None. FINDINGS: Brain: An acute hemorrhage centered in the right thalamus measures 3.3 x 3.0 x 4.2 cm (estimated volume 21 cc) with extension into the right midbrain. There is mild surrounding edema. There is intraventricular extension with a large amount of blood in the right lateral ventricle and a smaller amount in the left lateral ventricle, third ventricle, and fourth ventricle. There is mild dilatation of the right temporal horn. Focal midline shift at the level of the thalami/third ventricle measures 8 mm. No acute cortically based infarct or extra-axial fluid collection is identified. Vascular: Calcified atherosclerosis at the skullbase. No hyperdense vessel. Intracranial arterial dolichoectasia. Skull: No fracture or focal osseous lesion. Sinuses/Orbits: Minimal mucosal thickening in the maxillary sinuses. Clear mastoid air cells. Unremarkable orbits. Other: None. ASPECTS [facility] Stroke Program  Early CT Score) Not scored due to hemorrhage. 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 Trapping of the right temporal horn. Critical Value/emergent results were called by telephone at the time of interpretation on 08/26/2017 at 8:48 pm to Dr. Leonel Ramsay, who verbally acknowledged these results. Electronically Signed   By: Logan Bores M.D.   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 Acute respiratory failure requiring mechanical ventilation for airway protection Hypertensive emergency Acute encephalopathy secondary to [place]  Plan - Cardene drip and keep SBP 120-140 - sedate with propofol for frequent neuro checks - frequent neuro checks - repeat CT head at am - SCD for DVT prophylaxis - avoid anticoagulation - SS insulin - keep on mechanical ventilation - assess  for any signs of increased ICP  I have spent 40 mins of CC time bedside or in the unit exclusive of any billable procedures patient is needing ICU due to respiratory failure requiring mechanical ventilation    FAMILY  - Updates: husband and daughter bedside and updated about her condition and plan of care.  - Inter-disciplinary family meet or Palliative Care meeting due by:      Pulmonary and [place] Pager: [phone]  , 9:39 PM

## 2017-08-22 NOTE — Progress Notes (Signed)
Patient transported on vent from ED to 4J-09 without complication.

## 2017-08-22 NOTE — Code Documentation (Addendum)
Patient arrived as a Code Stroke, per husband, patient was in her usual state til 1830, patient sustained a fall at home, struck her head, bruising/hematoma to left eye. + MJ Collar. + facial droop to the left, + R gaze, + left sided weakness.  Upon arrival, patient was very hard to arouse, almost completely unresponsive. Taken to Trauma C for RSI for airway protection, patient was nauseous at the bridge but did not vomit.  Patient intubated then taken to CT at 2140, CT revealed: Acute hemorrhage centered in the right thalamus measures with extension into the right midbrain. There is mild surrounding edema. There is intraventricular extension with a large amount of blood in the right lateral ventricle and a smaller amount in the left lateral ventricle, third ventricle, and fourth ventricle. Focal midline shift at the level of the thalami/third ventricle measures 8 mm.   Patient was given 20mg  IV Labetalol and 4mg  Versed upon arrival to CT, additional Versed 2mg  given along with initiation of Cardene drip and Propofol drip.

## 2017-08-22 NOTE — ED Triage Notes (Signed)
Pt lives at home with husband. Had been taking a nap when the husband her pt fall from bed to floor, striking L side of face on the floor. Pt was unable to get hup due to L sided arm and leg weakness. Pt arrived to ED for code stroke, lethargic on arrival, L sided facial droop noted face with drooling. Hematoma noted to L forehead, no movement noted to L arm or leg, R sided gaze present. Pt to Trauma C for intubation prior to CT

## 2017-08-22 NOTE — ED Provider Notes (Signed)
Ord DEPT Provider Note   CSN: [id number] Arrival date & time:        History   Chief Complaint Chief Complaint  Patient presents with  . Code Stroke    HPI Teresa Morales is a 76 y.o. female.  HPI At roughly 6:30pm patient was heard fall onto the floor by her husband and another room. No apparent loss of consciousness. She did strike the left side of her face on the floor. She is unable to get up from the floor. Patient became increasingly unresponsive and EMS was called. Patient with dense left-sided weakness and left-sided facial droop. Unable to answer questions. Level V caveat. Past Medical History:  Diagnosis Date  . Anemia    history of borderline anemia  . Arthritis    left thumb and lower back,  . Atypical glandular cells of undetermined significance (AGUS) on cervical Pap smear 12/1998   endo polyp- [name]  . Disc degeneration, lumbar 2016   L4, L5, S1  . Endocervical polyp 1997  . Environmental allergies   . GERD (gastroesophageal reflux disease)   . Headache    Sinusitis  . Hypertension   . Hyponatremia   . Interstitial cystitis 2004   mild  . Melanoma of face ([place]) 04/2007  . Mild dysplasia of cervix    laser  . MRSA infection    many yrs ago -right hand(tx. with oral antibiotic)-no further issues  . Osteopenia    intolerant of bisphosphonates  . 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 Urethral stenosis    dilated  . Varicose vein of leg     Patient Active Problem List   Diagnosis Date Noted  . ICH (intracerebral hemorrhage) ([place]) 09/13/2017  . Gastroesophageal reflux disease without esophagitis 01/07/2016  . Insect sting allergy, current reaction 01/07/2016  . Rhinitis, allergic 08/25/2015  . Recurrent ventral hernia 05/31/2014    Past Surgical History:  Procedure Laterality Date  . COLONOSCOPY WITH PROPOFOL N/A 10/02/2015   Procedure: COLONOSCOPY WITH PROPOFOL;   Surgeon: Garlan Fair, MD;  Location: [facility];  Service: Endoscopy;  Laterality: N/A;  . DILATATION & CURETTAGE/HYSTEROSCOPY WITH MYOSURE N/A 06/29/2017   Procedure: DILATATION & CURETTAGE/HYSTEROSCOPY WITH MYOSURE;  Surgeon: Nunzio Cobbs, MD;  Location: [facility];  Service: Gynecology;  Laterality: N/A;  . HAND SURGERY     cyst removed from rt hand  . HERNIA REPAIR  1324, 4/0/10   umbilical mesh placed in August 2015.  Marland Kitchen HERNIA REPAIR  02-05-15   bilateral inguinal - mesh  . MELANOMA EXCISION Right    '08- right cheek- no futher issues  . varicose vein Left 2016   past years bilateral  . WISDOM TOOTH EXTRACTION      OB History    Gravida Para Term Preterm AB Living   1 1 1  0 0 1   SAB TAB Ectopic Multiple Live Births   0 0 0 0 1       Home Medications    Prior to Admission medications   Medication Sig Start Date End Date Taking? Authorizing Provider  azelastine (ASTELIN) 0.1 % nasal spray 2 sprays per nostril twice for runny nose. Patient taking differently: Place 1-2 sprays into both nostrils See admin instructions. Instill 1-2 sprays into each nare once or twice daily for runny nose. 06/25/17  Yes Valentina Shaggy, MD  Calcium Citrate-Vitamin D 200-250 MG-UNIT TABS Take 2 tablets  by mouth 2 (two) times daily.   Yes [provider]  cholecalciferol (VITAMIN D) 1000 UNITS tablet Take 1,000 Units by mouth daily.   Yes [provider]  EPINEPHrine (EPIPEN 2-PAK) 0.3 mg/0.3 mL IJ SOAJ injection Inject 0.3 mLs (0.3 mg total) into the muscle once. Patient taking differently: Inject 0.3 mg into the muscle once as needed (severe allergic reaction).  06/26/16  Yes Bardelas, Jose A, MD  estradiol (ESTRACE) 0.1 MG/GM vaginal cream USE 0.5 GRAMS VAGINALLY 2 TO 3 TIMES EVERY WEEK 07/09/17  Yes Nunzio Cobbs, MD  Eszopiclone (ESZOPICLONE) 3 MG TABS Take 3 mg by mouth at bedtime as needed (for sleep). Take immediately before bedtime (Lunesta)    Yes [provider]  fluticasone (FLONASE) 50 MCG/ACT nasal spray Place 1 spray into both nostrils See admin instructions. Instill 1 spray into each nare once or twice daily   Yes [provider]  levocetirizine (XYZAL) 5 MG tablet Take 5 mg by mouth daily. 08/06/17  Yes [provider]  loratadine (CLARITIN) 10 MG tablet Take 10 mg by mouth daily.   Yes [provider]  Misc Natural Products (OSTEO BI-FLEX JOINT SHIELD PO) Take 2 tablets by mouth daily.    Yes [provider]  Misc Natural Products (TART CHERRY ADVANCED) CAPS Take 2 capsules by mouth 2 (two) times daily.   Yes [provider]  montelukast (SINGULAIR) 10 MG tablet TAKE 1 TABLET BY MOUTH EVERY EVENING TO PREVENT COUGH OR WHEEZE Patient taking differently: TAKE 1 TABLET (10 MG) BY MOUTH EVERY EVENING TO PREVENT COUGH OR WHEEZE 05/12/17  Yes Valentina Shaggy, MD  Multiple Vitamin (MULTIVITAMIN WITH MINERALS) TABS tablet Take 1 tablet by mouth daily.   Yes [provider]  olmesartan (BENICAR) 20 MG tablet Take 20 mg by mouth daily. 07/10/17  Yes [provider]  Omega-3 Fatty Acids (FISH OIL) 1000 MG CAPS Take 1,000 mg by mouth daily.   Yes [provider]  omeprazole (PRILOSEC) 20 MG capsule Take 20 mg by mouth 2 (two) times daily.    Yes [provider]  Probiotic CAPS Take 2 capsules by mouth daily.   Yes [provider]  triamcinolone cream (KENALOG) 0.1 % Apply 1 application topically 2 (two) times daily as needed (for rash).  03/22/14  Yes [provider]  triamterene-hydrochlorothiazide (MAXZIDE-25) 37.5-25 MG per tablet Take 0.5 tablets by mouth daily.   Yes [provider]  trolamine salicylate (ASPERCREME) 10 % cream Apply 1 application topically at bedtime as needed for muscle pain.    Yes [provider]  valsartan (DIOVAN) 160 MG tablet Take 160 mg by mouth every evening.   Yes [provider]  ibuprofen (ADVIL,MOTRIN) 800 MG tablet Take 1 tablet (800 mg total) by mouth every 8 (eight) hours as needed for cramping. Patient not taking: Reported on 07/03/2017 06/29/17   Nunzio Cobbs, MD  lidocaine (XYLOCAINE) 5 % ointment Apply 1 application topically 4 (four) times daily as needed. Patient not taking: Reported on 07/03/2017 04/23/17   Salvadore Dom, MD  metroNIDAZOLE (METROGEL) 0.75 % vaginal gel Place 1 Applicatorful vaginally at bedtime. X 5 nights Patient not taking: Reported on 09/10/2017 07/09/17   Nunzio Cobbs, MD    Family History Family History  Problem Relation Age of Onset  . Other Mother 32       MVA   . Lupus Sister 38  . Hypertension  Father   . Osteoporosis Sister   . Thyroid disease Sister   . Scoliosis Sister   . Prostate cancer Brother   . Allergic rhinitis Neg Hx   . Angioedema Neg Hx   . Asthma Neg Hx   . Eczema Neg Hx   . Immunodeficiency Neg Hx   . Urticaria Neg Hx     Social History Social History  Substance Use Topics  . Smoking status: Never Smoker  . Smokeless tobacco: Never Used  . Alcohol use No     Allergies   Bee venom; Macrodantin [nitrofurantoin]; Chlorhexidine; Darvocet [propoxyphene n-acetaminophen]; Endocet [oxycodone-acetaminophen]; Floxin [ofloxacin]; Latex; Penetrex [enoxacin]; Sulfa antibiotics; and Hibiclens [chlorhexidine gluconate]   Review of Systems Review of Systems  Unable to perform ROS: Intubated     Physical Exam Updated Vital Signs BP (!) 148/87   Pulse 82   Temp (!) 96.1 F (35.6 C)   Resp 19   Ht 5\' 3"  (1.6 m)   Wt 57.8 kg (127 lb 6.8 oz)   LMP 12/15/1993 (Approximate)   SpO2 100%   BMI 22.57 kg/m   Physical Exam  Constitutional: She appears well-developed and well-nourished.  HENT:  Head: Normocephalic and atraumatic.  Mouth/Throat: Oropharynx is clear and moist.  Left-sided facial contusion. Left lower facial droop. Drooling. Small abrasion to the tip of the  tongue.  Eyes: Pupils are equal, round, and reactive to light. EOM are normal.  Right-sided gaze preference  Neck:  Cervical collar in place.  Cardiovascular: Normal rate and regular rhythm.  Exam reveals no gallop and no friction rub.   No murmur heard. Pulmonary/Chest: Effort normal and breath sounds normal. No respiratory distress. She has no wheezes. She has no rales. She exhibits no tenderness.  Abdominal: Soft. Bowel sounds are normal. There is no tenderness. There is no rebound and no guarding.  Musculoskeletal: Normal range of motion. She exhibits no edema or tenderness.  Neurological:  0/5 left upper and left lower extremity. 3/5 right upper and right lower extremity. Left-sided facial droop. Nonverbal.  Skin: Skin is warm and dry. No rash noted. No erythema.  Psychiatric: She has a normal mood and affect. Her behavior is normal.  Nursing note and vitals reviewed.    ED Treatments / Results  Labs (all labs ordered are listed, but only abnormal results are displayed) Labs Reviewed  COMPREHENSIVE METABOLIC PANEL - Abnormal; Notable for the following:       Result Value   Sodium 124 (*)    Potassium 3.0 (*)    Chloride 91 (*)    Glucose, Bld 154 (*)    Calcium 8.4 (*)    All other components within normal limits  CBG MONITORING, ED - Abnormal; Notable for the following:    Glucose-Capillary 162 (*)    All other components within normal limits  I-STAT CHEM 8, ED - Abnormal; Notable for the following:    Sodium 128 (*)    Potassium 3.1 (*)    Chloride 91 (*)    Creatinine, Ser 0.40 (*)    Glucose, Bld 150 (*)    Calcium, Ion 1.03 (*)    All other components within normal limits  PROTIME-INR  APTT  CBC  DIFFERENTIAL  BASIC METABOLIC PANEL  CBC  I-STAT TROPONIN, ED    EKG  EKG Interpretation None       Radiology Ct Cervical Spine Wo Contrast  Result Date: 09/10/2017 CLINICAL DATA:  Code stroke leading to fall. EXAM: CT CERVICAL SPINE WITHOUT CONTRAST  TECHNIQUE: Multidetector CT imaging of the cervical spine was performed without intravenous contrast. Multiplanar CT image reconstructions were also generated. COMPARISON:  Head CT performed concurrently and reported separately. FINDINGS: Alignment: Normal. Skull base and vertebrae: No acute fracture. Vertebral body heights are maintained. The dens and skull base are intact. Soft tissues and spinal canal: None subarachnoid hemorrhage does not extend into the cervical canal. No prevertebral soft tissue edema. Disc levels: Disc space narrowing and endplate spurring at X3-K4 and to a lesser extent C6-C7. Upper chest: Mild biapical pleuroparenchymal scarring. Endotracheal tube in place. Other: None. IMPRESSION: Mild degenerative disc disease in the cervical spine without acute fracture or subluxation. Electronically Signed   By: Jeb Levering M.D.   On: 08/30/2017 21:06   Dg Chest Portable 1 View  Result Date: 09/05/2017 CLINICAL DATA:  Intubation EXAM: PORTABLE CHEST 1 VIEW COMPARISON:  Portable exam 2056 hours without priors for comparison FINDINGS: Tip of endotracheal tube projects 5.1 cm above carina. Orogastric tube coiled in proximal stomach. Enlargement of cardiac silhouette. Mediastinal contours and pulmonary vascularity normal. Calcified adenopathy at RIGHT hilum. Lungs grossly clear. No infiltrate, pleural effusion or pneumothorax. Bones demineralized. IMPRESSION: Tube positions as above. Old granulomatous disease. No acute abnormalities. Electronically Signed   By: Lavonia Dana M.D.   On: 08/27/2017 21:02   Ct Head Code Stroke Wo Contrast  Result Date: 08/26/2017 CLINICAL DATA:  Code stroke.  Left-sided weakness. EXAM: CT HEAD WITHOUT CONTRAST TECHNIQUE: Contiguous axial images were obtained from the base of the skull through the vertex without intravenous contrast. COMPARISON:  None. FINDINGS: Brain: An acute hemorrhage centered in the right thalamus measures 3.3 x 3.0 x 4.2 cm (estimated volume 21  cc) with extension into the right midbrain. There is mild surrounding edema. There is intraventricular extension with a large amount of blood in the right lateral ventricle and a smaller amount in the left lateral ventricle, third ventricle, and fourth ventricle. There is mild dilatation of the right temporal horn. Focal midline shift at the level of the thalami/third ventricle measures 8 mm. No acute cortically based infarct or extra-axial fluid collection is identified. Vascular: Calcified atherosclerosis at the skullbase. No hyperdense vessel. Intracranial arterial dolichoectasia. Skull: No fracture or focal osseous lesion. Sinuses/Orbits: Minimal mucosal thickening in the maxillary sinuses. Clear mastoid air cells. Unremarkable orbits. Other: None. ASPECTS [facility] Stroke Program Early CT Score) Not scored due to hemorrhage. 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 Trapping of the right temporal horn. Critical Value/emergent results were called by telephone at the time of interpretation on 08/30/2017 at 8:48 pm to Dr. Leonel Ramsay, who verbally acknowledged these results. Electronically Signed   By: Logan Bores M.D.   On: 09/01/2017 20:50    Procedures Procedure Name: Intubation Date/Time: 08/16/2017 8:31 PM Performed by: Lita Mains, Laela Deviney Oxygen Delivery Method: Ambu bag Preoxygenation: Pre-oxygenation with 100% oxygen Induction Type: Rapid sequence and Cricoid Pressure applied Ventilation: Mask ventilation without difficulty Laryngoscope Size: Glidescope and 3 Tube size: 7.5 mm Number of attempts: 1 Placement Confirmation: ETT inserted through vocal cords under direct vision,  Positive ETCO2 and Breath sounds checked- equal and bilateral Secured at: 19 cm Tube secured with: ETT holder      (including critical care time)  Medications Ordered in ED Medications  etomidate (AMIDATE) injection (15 mg Intravenous Given 08/19/2017 2027)  succinylcholine (ANECTINE)  injection (75 mg Intravenous Given 09/12/2017 2027)  midazolam (VERSED) injection 2 mg (2 mg Intravenous Given 09/02/2017 2035)  nicardipine (CARDENE) 20mg  in 0.86% saline  241ml IV infusion (0.1 mg/ml) (5 mg/hr Intravenous New Bag/Given 09/01/2017 2057)  potassium chloride 10 mEq in 100 mL IVPB (not administered)  0.9 %  sodium chloride infusion (not administered)  midazolam (VERSED) 2 MG/2ML injection (2 mg  Given 09/11/2017 2108)  labetalol (NORMODYNE,TRANDATE) 5 MG/ML injection (20 mg  Given 08/21/2017 2056)  propofol (DIPRIVAN) 1000 MG/100ML infusion (60 mcg/kg/min  Rate/Dose Change 08/31/2017 2108)    CRITICAL CARE Performed by: Lita Mains, Alynn Ellithorpe Total critical care time:30 minutes Critical care time was exclusive of separately billable procedures and treating other patients. Critical care was necessary to treat or prevent imminent or life-threatening deterioration. Critical care was time spent personally by me on the following activities: development of treatment plan with patient and/or surrogate as well as nursing, discussions with consultants, evaluation of patient's response to treatment, examination of patient, obtaining history from patient or surrogate, ordering and performing treatments and interventions, ordering and review of laboratory studies, ordering and review of radiographic studies, pulse oximetry and re-evaluation of patient's condition.  Initial Impression / Assessment and Plan / ED Course  I have reviewed the triage vital signs and the nursing notes.  Pertinent labs & imaging results that were available during my care of the patient were reviewed by me and considered in my medical decision making (see chart for details).     Given mental status decline and concern for protection of airway, decision made to intubate patient prior to getting CT scan.  Patient with large parenchymal bleed with right to left shift. Neurology to admit. They have consulted critical care. Final Clinical  Impressions(s) / ED Diagnoses   Final diagnoses:  Nontraumatic hemorrhage of right cerebral hemisphere [facility])    New Prescriptions New Prescriptions   No medications on file     Julianne Rice, MD 08/26/2017 2135

## 2017-08-23 ENCOUNTER — Inpatient Hospital Stay (HOSPITAL_COMMUNITY): Payer: Medicare Other

## 2017-08-23 DIAGNOSIS — E871 Hypo-osmolality and hyponatremia: Secondary | ICD-10-CM

## 2017-08-23 DIAGNOSIS — I610 Nontraumatic intracerebral hemorrhage in hemisphere, subcortical: Secondary | ICD-10-CM

## 2017-08-23 DIAGNOSIS — J9600 Acute respiratory failure, unspecified whether with hypoxia or hypercapnia: Secondary | ICD-10-CM

## 2017-08-23 DIAGNOSIS — G919 Hydrocephalus, unspecified: Secondary | ICD-10-CM

## 2017-08-23 DIAGNOSIS — Z978 Presence of other specified devices: Secondary | ICD-10-CM

## 2017-08-23 DIAGNOSIS — I612 Nontraumatic intracerebral hemorrhage in hemisphere, unspecified: Secondary | ICD-10-CM

## 2017-08-23 DIAGNOSIS — I161 Hypertensive emergency: Secondary | ICD-10-CM

## 2017-08-23 DIAGNOSIS — I615 Nontraumatic intracerebral hemorrhage, intraventricular: Secondary | ICD-10-CM

## 2017-08-23 LAB — BASIC METABOLIC PANEL
Anion gap: 9 (ref 5–15)
BUN: 6 mg/dL (ref 6–20)
CALCIUM: 8.4 mg/dL — AB (ref 8.9–10.3)
CO2: 22 mmol/L (ref 22–32)
Chloride: 100 mmol/L — ABNORMAL LOW (ref 101–111)
Creatinine, Ser: 0.43 mg/dL — ABNORMAL LOW (ref 0.44–1.00)
GFR calc Af Amer: >60 mL/min (ref >60)
GLUCOSE: 117 mg/dL — AB (ref 65–99)
POTASSIUM: 3.4 mmol/L — AB (ref 3.5–5.1)
Sodium: 131 mmol/L — ABNORMAL LOW (ref 135–145)

## 2017-08-23 LAB — GLUCOSE, CAPILLARY
GLUCOSE-CAPILLARY: 130 mg/dL — AB (ref 65–99)
GLUCOSE-CAPILLARY: 129 mg/dL — AB (ref 65–99)
GLUCOSE-CAPILLARY: 99 mg/dL (ref 65–99)
GLUCOSE-CAPILLARY: 125 mg/dL — AB (ref 65–99)
GLUCOSE-CAPILLARY: 116 mg/dL — AB (ref 65–99)
Glucose-Capillary: 135 mg/dL — ABNORMAL HIGH (ref 65–99)
Glucose-Capillary: 115 mg/dL — ABNORMAL HIGH (ref 65–99)

## 2017-08-23 LAB — CBC
HEMATOCRIT: 37.7 % (ref 36.0–46.0)
Hemoglobin: 13 g/dL (ref 12.0–15.0)
MCH: 31.3 pg (ref 26.0–34.0)
MCHC: 34.5 g/dL (ref 30.0–36.0)
MCV: 90.8 fL (ref 78.0–100.0)
Platelets: 302 10*3/uL (ref 150–400)
RBC: 4.15 MIL/uL (ref 3.87–5.11)
RDW: 13.1 % (ref 11.5–15.5)
WBC: 11.3 10*3/uL — ABNORMAL HIGH (ref 4.0–10.5)

## 2017-08-23 LAB — MAGNESIUM
MAGNESIUM: 2.1 mg/dL (ref 1.7–2.4)
Magnesium: 1.9 mg/dL (ref 1.7–2.4)

## 2017-08-23 LAB — SODIUM
SODIUM: 138 mmol/L (ref 135–145)
Sodium: 132 mmol/L — ABNORMAL LOW (ref 135–145)
Sodium: 138 mmol/L (ref 135–145)

## 2017-08-23 LAB — PHOSPHORUS
PHOSPHORUS: 2.4 mg/dL — AB (ref 2.5–4.6)
PHOSPHORUS: 1.7 mg/dL — AB (ref 2.5–4.6)

## 2017-08-23 LAB — TRIGLYCERIDES: Triglycerides: 64 mg/dL (ref <150)

## 2017-08-23 LAB — MRSA PCR SCREENING: MRSA BY PCR: NEGATIVE

## 2017-08-23 MED ORDER — PRO-STAT SUGAR FREE PO LIQD
30.0000 mL | Freq: Two times a day (BID) | ORAL | Status: DC
  Administered 2017-08-23 – 2017-08-27 (×9): 30 mL
  Filled 2017-08-23 (×9): qty 30

## 2017-08-23 MED ORDER — POTASSIUM CHLORIDE 20 MEQ/15ML (10%) PO SOLN
40.0000 meq | Freq: Once | ORAL | Status: AC
  Administered 2017-08-23: 40 meq
  Filled 2017-08-23: qty 30

## 2017-08-23 MED ORDER — ORAL CARE MOUTH RINSE
15.0000 mL | OROMUCOSAL | Status: DC
  Administered 2017-08-23 – 2017-08-28 (×54): 15 mL via OROMUCOSAL

## 2017-08-23 MED ORDER — SODIUM CHLORIDE 3 % IV SOLN
INTRAVENOUS | Status: DC
  Administered 2017-08-23 – 2017-08-24 (×3): 50 mL/h via INTRAVENOUS
  Filled 2017-08-23 (×3): qty 500

## 2017-08-23 MED ORDER — VITAL HIGH PROTEIN PO LIQD
1000.0000 mL | ORAL | Status: DC
  Administered 2017-08-23: 1000 mL
  Administered 2017-08-24 (×2)
  Administered 2017-08-24: 1000 mL
  Administered 2017-08-24 (×2)

## 2017-08-23 NOTE — Progress Notes (Signed)
Transported pt to and from 4N17 to CT 1 on ventilator. Pt stable throughout with no complications. RT will continue to monitor.

## 2017-08-23 NOTE — Consult Note (Signed)
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 Moss Mc Teresa Morales is an 76 y.o. female.  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 Patient was observed had some decline in neurologic status CT scan showed relatively stable hemorrhage with mild to moderate ventricular dilatation. We will consult with regard to the utility of external ventricular drainage.  Past Medical History:  Diagnosis Date  . Anemia    history of borderline anemia  . Arthritis    left thumb and lower back,  . Atypical glandular cells of undetermined significance (AGUS) on cervical Pap smear 12/1998   endo polyp- [name]  . Disc degeneration, lumbar 2016   L4, L5, S1  . Endocervical polyp 1997  . Environmental allergies   . GERD (gastroesophageal reflux disease)   . Headache    Sinusitis  . Hypertension   . Hyponatremia   . Interstitial cystitis 2004   mild  . Melanoma of face ([place]) 04/2007  . Mild dysplasia of cervix    laser  . MRSA infection    many yrs ago -right hand(tx. with oral antibiotic)-no further issues  . Osteopenia    intolerant of bisphosphonates  . 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 Urethral stenosis    dilated  . Varicose vein of leg     Past Surgical History:  Procedure Laterality Date  . COLONOSCOPY WITH PROPOFOL N/A 10/02/2015   Procedure: COLONOSCOPY WITH PROPOFOL;  Surgeon: Garlan Fair, MD;  Location: [facility];  Service: Endoscopy;  Laterality: N/A;  . DILATATION & CURETTAGE/HYSTEROSCOPY WITH MYOSURE N/A 06/29/2017   Procedure: DILATATION & CURETTAGE/HYSTEROSCOPY WITH MYOSURE;  Surgeon: Nunzio Cobbs, MD;  Location: [facility];  Service: Gynecology;  Laterality: N/A;  . HAND  SURGERY     cyst removed from rt hand  . HERNIA REPAIR  2952, 07/18/12   umbilical mesh placed in August 2015.  Marland Kitchen HERNIA REPAIR  02-05-15   bilateral inguinal - mesh  . MELANOMA EXCISION Right    '08- right cheek- no futher issues  . varicose vein Left 2016   past years bilateral  . WISDOM TOOTH EXTRACTION      Family History  Problem Relation Age of Onset  . Other Mother 45       MVA   . Lupus Sister 49  . Hypertension Father   . Osteoporosis Sister   . Thyroid disease Sister   . Scoliosis Sister   . Prostate cancer Brother   . Allergic rhinitis Neg Hx   . Angioedema Neg Hx   . Asthma Neg Hx   . Eczema Neg Hx   . Immunodeficiency Neg Hx   . Urticaria Neg Hx     Social History:  reports that she has never smoked. She has never used smokeless tobacco. She reports that she does not drink alcohol or use drugs.  Allergies:  Allergies  Allergen Reactions  . Bee Venom Swelling    Reaction to yellow jackets  . Macrodantin [Nitrofurantoin] Hives and Shortness Of Breath  . Chlorhexidine Itching    Redness  . Darvocet [Propoxyphene N-Acetaminophen] Hives and Itching    Tolerates acetaminophen  . Endocet [Oxycodone-Acetaminophen] Hives and Itching    Tolerates acetaminophen  . Floxin [Ofloxacin] Itching  . Latex Itching  .  Penetrex [Enoxacin] Itching  . Sulfa Antibiotics Other (See Comments)    Unknown - childhood allergy   . Hibiclens [Chlorhexidine Gluconate] Rash    Local reaction with previous surgery.     Medications: I have reviewed the patient's current medications.  Results for orders placed or performed during the hospital encounter of 08/31/2017 (from the past 48 hour(s))  Triglycerides     Status: None   Collection Time: 08/19/2017 12:58 AM  Result Value Ref Range   Triglycerides 64 <150 mg/dL  CBG monitoring, ED     Status: Abnormal   Collection Time: 08/15/2017  8:18 PM  Result Value Ref Range   Glucose-Capillary 162 (H) 65 - 99 mg/dL   Comment 1 Notify RN     Comment 2 Call MD NNP PA CNM   Protime-INR     Status: None   Collection Time: 08/21/2017  8:19 PM  Result Value Ref Range   Prothrombin Time 12.3 11.4 - 15.2 seconds   INR 0.93   APTT     Status: None   Collection Time: 08/16/2017  8:19 PM  Result Value Ref Range   aPTT 28 24 - 36 seconds  CBC     Status: None   Collection Time: 09/02/2017  8:19 PM  Result Value Ref Range   WBC 6.6 4.0 - 10.5 K/uL   RBC 4.00 3.87 - 5.11 MIL/uL   Hemoglobin 12.6 12.0 - 15.0 g/dL   HCT 36.2 36.0 - 46.0 %   MCV 90.5 78.0 - 100.0 fL   MCH 31.5 26.0 - 34.0 pg   MCHC 34.8 30.0 - 36.0 g/dL   RDW 13.1 11.5 - 15.5 %   Platelets 310 150 - 400 K/uL  Differential     Status: None   Collection Time: 08/24/2017  8:19 PM  Result Value Ref Range   Neutrophils Relative % 75 %   Neutro Abs 5.0 1.7 - 7.7 K/uL   Lymphocytes Relative 16 %   Lymphs Abs 1.1 0.7 - 4.0 K/uL   Monocytes Relative 7 %   Monocytes Absolute 0.5 0.1 - 1.0 K/uL   Eosinophils Relative 1 %   Eosinophils Absolute 0.1 0.0 - 0.7 K/uL   Basophils Relative 1 %   Basophils Absolute 0.1 0.0 - 0.1 K/uL  Comprehensive metabolic panel     Status: Abnormal   Collection Time: 08/19/2017  8:19 PM  Result Value Ref Range   Sodium 124 (L) 135 - 145 mmol/L   Potassium 3.0 (L) 3.5 - 5.1 mmol/L   Chloride 91 (L) 101 - 111 mmol/L   CO2 24 22 - 32 mmol/L   Glucose, Bld 154 (H) 65 - 99 mg/dL   BUN 11 6 - 20 mg/dL   Creatinine, Ser 0.45 0.44 - 1.00 mg/dL   Calcium 8.4 (L) 8.9 - 10.3 mg/dL   Total Protein 6.7 6.5 - 8.1 g/dL   Albumin 3.8 3.5 - 5.0 g/dL   AST 30 15 - 41 U/L   ALT 28 14 - 54 U/L   Alkaline Phosphatase 64 38 - 126 U/L   Total Bilirubin 0.4 0.3 - 1.2 mg/dL   GFR calc non Af Amer >60 >60 mL/min   GFR calc Af Amer >60 >60 mL/min    Comment: (NOTE) The eGFR has been calculated using the CKD EPI equation. This calculation has not been validated in all clinical situations. eGFR's persistently <60 mL/min signify possible Chronic Kidney Disease.     Anion gap 9 5 - 15  I-stat troponin, ED     Status: None   Collection Time: 09/08/2017  8:25 PM  Result Value Ref Range   Troponin i, poc 0.00 0.00 - 0.08 ng/mL   Comment 3            Comment: Due to the release kinetics of cTnI, a negative result within the first hours of the onset of symptoms does not rule out myocardial infarction with certainty. If myocardial infarction is still suspected, repeat the test at appropriate intervals.   I-Stat Chem 8, ED     Status: Abnormal   Collection Time: 09/09/2017  8:26 PM  Result Value Ref Range   Sodium 128 (L) 135 - 145 mmol/L   Potassium 3.1 (L) 3.5 - 5.1 mmol/L   Chloride 91 (L) 101 - 111 mmol/L   BUN 12 6 - 20 mg/dL   Creatinine, Ser 0.40 (L) 0.44 - 1.00 mg/dL   Glucose, Bld 150 (H) 65 - 99 mg/dL   Calcium, Ion 1.03 (L) 1.15 - 1.40 mmol/L   TCO2 24 22 - 32 mmol/L   Hemoglobin 13.3 12.0 - 15.0 g/dL   HCT 39.0 36.0 - 46.0 %  I-Stat arterial blood gas, ED     Status: Abnormal   Collection Time: 09/09/2017  9:41 PM  Result Value Ref Range   pH, Arterial 7.436 7.350 - 7.450   pCO2 arterial 43.4 32.0 - 48.0 mmHg   pO2, Arterial 495.0 (H) 83.0 - 108.0 mmHg   Bicarbonate 29.7 (H) 20.0 - 28.0 mmol/L   TCO2 31 22 - 32 mmol/L   O2 Saturation 100.0 %   Acid-Base Excess 4.0 (H) 0.0 - 2.0 mmol/L   Patient temperature 95.6 F    Collection site RADIAL, ALLEN'S TEST ACCEPTABLE    Drawn by Operator    Sample type ARTERIAL   MRSA PCR Screening     Status: None   Collection Time: 08/15/2017 10:47 PM  Result Value Ref Range   MRSA by PCR NEGATIVE NEGATIVE    Comment:        The GeneXpert MRSA Assay (FDA approved for NASAL specimens only), is one component of a comprehensive MRSA colonization surveillance program. It is not intended to diagnose MRSA infection nor to guide or monitor treatment for MRSA infections.   Glucose, capillary     Status: Abnormal   Collection Time: 09/06/2017 11:08 PM  Result Value Ref Range   Glucose-Capillary 130  (H) 65 - 99 mg/dL  Glucose, capillary     Status: Abnormal   Collection Time: 08/23/17  4:17 AM  Result Value Ref Range   Glucose-Capillary 129 (H) 65 - 99 mg/dL  Basic metabolic panel     Status: Abnormal   Collection Time: 08/23/17  6:15 AM  Result Value Ref Range   Sodium 131 (L) 135 - 145 mmol/L   Potassium 3.4 (L) 3.5 - 5.1 mmol/L   Chloride 100 (L) 101 - 111 mmol/L   CO2 22 22 - 32 mmol/L   Glucose, Bld 117 (H) 65 - 99 mg/dL   BUN 6 6 - 20 mg/dL   Creatinine, Ser 0.43 (L) 0.44 - 1.00 mg/dL   Calcium 8.4 (L) 8.9 - 10.3 mg/dL   GFR calc non Af Amer >60 >60 mL/min   GFR calc Af Amer >60 >60 mL/min    Comment: (NOTE) The eGFR has been calculated using the CKD EPI equation. This calculation has not been validated in all clinical situations. eGFR's persistently <60 mL/min signify possible Chronic  Kidney Disease.    Anion gap 9 5 - 15  CBC     Status: Abnormal   Collection Time: 08/23/17  6:15 AM  Result Value Ref Range   WBC 11.3 (H) 4.0 - 10.5 K/uL   RBC 4.15 3.87 - 5.11 MIL/uL   Hemoglobin 13.0 12.0 - 15.0 g/dL   HCT 37.7 36.0 - 46.0 %   MCV 90.8 78.0 - 100.0 fL   MCH 31.3 26.0 - 34.0 pg   MCHC 34.5 30.0 - 36.0 g/dL   RDW 13.1 11.5 - 15.5 %   Platelets 302 150 - 400 K/uL  Glucose, capillary     Status: None   Collection Time: 08/23/17  7:03 AM  Result Value Ref Range   Glucose-Capillary 99 65 - 99 mg/dL    Ct Morales Wo Contrast  Result Date: 08/23/2017 CLINICAL DATA:  Follow-up intracranial hemorrhage. EXAM: CT Morales WITHOUT CONTRAST TECHNIQUE: Contiguous axial images were obtained from the base of the skull through the vertex without intravenous contrast. COMPARISON:  CT Morales August 23, 2017 at 0046 hours FINDINGS: BRAIN: 2.9 x 3.4 cm RIGHT thalamic intraparenchymal hematoma is stable in size with similar extension to the midbrain, resulting in cerebral aqua duct effacement. Similar surrounding vasogenic edema and regional mass effect. Intraventricular extension again  noted with blood products within lateral ventricles, third and fourth ventricles. Similar moderate hydrocephalus. Stable 8 mm RIGHT to LEFT midline shift. Narrowed basal cisterns. No acute large vascular territory infarcts. No abnormal extra-axial fluid collections. VASCULAR: Mild calcific atherosclerosis of the carotid siphons. SKULL: No skull fracture. No significant scalp soft tissue swelling. SINUSES/ORBITS: Trace paranasal sinus mucosal thickening. Mastoid air cells are well aerated.The included ocular globes and orbital contents are non-suspicious. OTHER: None. IMPRESSION: 1. Evolving large RIGHT thalamic hematoma extending to midbrain with similar edema and mass effect including 8 mm RIGHT to LEFT midline shift. 2. Similar intraventricular extension of hematoma with stable moderate hydrocephalus. Electronically Signed   By: Elon Alas M.D.   On: 08/23/2017 06:24   Ct Morales Wo Contrast  Result Date: 08/23/2017 CLINICAL DATA:  Altered mental status.  Follow-up hemorrhage. EXAM: CT Morales WITHOUT CONTRAST TECHNIQUE: Contiguous axial images were obtained from the base of the skull through the vertex without intravenous contrast. COMPARISON:  CT Morales August 22, 2017 FINDINGS: BRAIN: 3.4 x 2.9 cm RIGHT thalamic intraparenchymal hematoma relatively stable in size, with with similar extension to the midbrain effacing the cerebral aqua duct. Similar surrounding low-density vasogenic edema. Intraventricular extension again noted. Worsening hydrocephalus with dilated temporal horns (LEFT temporal horn was 6 mm, now 8 mm). 8 mm RIGHT to LEFT midline shift is similar. Narrowed basal cisterns. No acute large vascular territory infarct. No abnormal extra-axial fluid collections. VASCULAR: Mild calcific atherosclerosis of the carotid siphons. SKULL: No skull fracture. No significant scalp soft tissue swelling. SINUSES/ORBITS: Trace paranasal sinus mucosal thickening. Mastoid air cells are well aerated. The included  ocular globes and orbital contents are non-suspicious. OTHER: None. IMPRESSION: 1. Evolving large RIGHT thalamic intraparenchymal hematoma extending to the midbrain with similar edema and mass effect. 2. Intraventricular extension of hemorrhage with worsening moderate hydrocephalus. 3. Acute findings discussed with and reconfirmed by Dr.MCNEILL [facility] on 08/23/2017 at 1:20 am. Electronically Signed   By: Elon Alas M.D.   On: 08/23/2017 01:29   Ct Cervical Spine Wo Contrast  Result Date: 08/21/2017 CLINICAL DATA:  Code stroke leading to fall. EXAM: CT CERVICAL SPINE WITHOUT CONTRAST TECHNIQUE: Multidetector CT imaging of the cervical spine  was performed without intravenous contrast. Multiplanar CT image reconstructions were also generated. COMPARISON:  Morales CT performed concurrently and reported separately. FINDINGS: Alignment: Normal. Skull base and vertebrae: No acute fracture. Vertebral body heights are maintained. The dens and skull base are intact. Soft tissues and spinal canal: None subarachnoid hemorrhage does not extend into the cervical canal. No prevertebral soft tissue edema. Disc levels: Disc space narrowing and endplate spurring at W4-R1 and to a lesser extent C6-C7. Upper chest: Mild biapical pleuroparenchymal scarring. Endotracheal tube in place. Other: None. IMPRESSION: Mild degenerative disc disease in the cervical spine without acute fracture or subluxation. Electronically Signed   By: Jeb Levering M.D.   On: 08/26/2017 21:06   Dg Chest Portable 1 View  Result Date: 09/13/2017 CLINICAL DATA:  Intubation EXAM: PORTABLE CHEST 1 VIEW COMPARISON:  Portable exam 2056 hours without priors for comparison FINDINGS: Tip of endotracheal tube projects 5.1 cm above carina. Orogastric tube coiled in proximal stomach. Enlargement of cardiac silhouette. Mediastinal contours and pulmonary vascularity normal. Calcified adenopathy at RIGHT hilum. Lungs grossly clear. No infiltrate, pleural  effusion or pneumothorax. Bones demineralized. IMPRESSION: Tube positions as above. Old granulomatous disease. No acute abnormalities. Electronically Signed   By: Lavonia Dana M.D.   On: 08/20/2017 21:02   Ct Morales Code Stroke Wo Contrast  Result Date: 09/10/2017 CLINICAL DATA:  Code stroke.  Left-sided weakness. EXAM: CT Morales WITHOUT CONTRAST TECHNIQUE: Contiguous axial images were obtained from the base of the skull through the vertex without intravenous contrast. COMPARISON:  None. FINDINGS: Brain: An acute hemorrhage centered in the right thalamus measures 3.3 x 3.0 x 4.2 cm (estimated volume 21 cc) with extension into the right midbrain. There is mild surrounding edema. There is intraventricular extension with a large amount of blood in the right lateral ventricle and a smaller amount in the left lateral ventricle, third ventricle, and fourth ventricle. There is mild dilatation of the right temporal horn. Focal midline shift at the level of the thalami/third ventricle measures 8 mm. No acute cortically based infarct or extra-axial fluid collection is identified. Vascular: Calcified atherosclerosis at the skullbase. No hyperdense vessel. Intracranial arterial dolichoectasia. Skull: No fracture or focal osseous lesion. Sinuses/Orbits: Minimal mucosal thickening in the maxillary sinuses. Clear mastoid air cells. Unremarkable orbits. Other: None. ASPECTS [facility] Stroke Program Early CT Score) Not scored due to hemorrhage. 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 Trapping of the right temporal horn. Critical Value/emergent results were called by telephone at the time of interpretation on 09/10/2017 at 8:48 pm to Dr. Leonel Ramsay, who verbally acknowledged these results. Electronically Signed   By: Logan Bores M.D.   On: 08/17/2017 20:50    Review of Systems  Unable to perform ROS: Critical illness   Blood pressure 124/70, pulse 75, temperature 99.9 F (37.7 C), resp. rate  16, height 5' 3"  (1.6 m), weight 57.8 kg (127 lb 6.8 oz), last menstrual period 12/15/1993, SpO2 100 %. Physical Exam  Neurological: She is unresponsive. GCS eye subscore is 1. GCS verbal subscore is 1. GCS motor subscore is 4.  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 I think the vast majority of her neurologic decline is related to direct damage thalamic midbrain from the hemorrhage and not from the hydrocephalus. I do think she has a partial blockage of spinal fluid flow I do not think it's a complete obstruction. Serial CT  scans overall the last to look stable with no worsening of her hydrocephalus. She has a very minimal neurologic exam. I had extensive conversations with the family and they both have living wills and have expressed not wanting to be kept alive if the best we could hope for would be existence consistent with persistent vegetative state or nursing home dependent feeding tube dependent and not independent at home. I think that ventricular drainage can certainly help alleviate some of the pressure however do not think he can reverse the primary pathology in the primary damage to  thalamus and midbrain. I explained this to the patient's husband and her daughter and recommended continued conservative treatment without procedural intervention. This is consistent with their wishes and they do not want to proceed forward with external ventricular drainage.  Teresa Morales P 08/23/2017, 8:14 AM

## 2017-08-23 NOTE — Progress Notes (Signed)
On assessment pt pupils are 2/3 and unreactive, questionable posturing, MD made aware, orders for STAT CT placed at this time.  Lizbeth Feijoo RN

## 2017-08-23 NOTE — Progress Notes (Signed)
PT Cancellation Note  Patient Details Name: Teresa Morales MRN: [id number] DOB: 10/26/1941   Cancelled Treatment:    Reason Eval/Treat Not Completed: Patient not medically ready (active bedrest orders at this time)   Duncan Dull 08/23/2017, 1:31 PM Alben Deeds, PT DPT  Board Certified Neurologic Specialist [phone]

## 2017-08-23 NOTE — Progress Notes (Signed)
Patient transported from 4N17 to CT and back without complications.

## 2017-08-23 NOTE — Progress Notes (Signed)
STROKE TEAM PROGRESS NOTE   HISTORY OF PRESENT ILLNESS (per record) Teresa Morales is a 76 y.o. female was last in her normal state of health just prior to 6:30 PM when he heard her fall. He went in and try to help her up, but she was unable to get up and wanted to take some time do get her strength back. When he tried to get her up again a little bit later he noticed that she was having trouble pushing up with her left arm and it was at that point that he decided to call 911.  LKW: 6:30 PM tpa given?: no, ICH ICH Score: 2  SUBJECTIVE (INTERVAL HISTORY) Her husband and daughter are at the bedside.  Pt intubated, not on sedation, not open eyes or following commands. Agitated with vent from time to time, RUE and RLE spontaneous movement, but LUE and LLE weakness but withdraw on pain. CT repeat showed stable moderate hydrocephalus. Husband hesitant for any procedure as per pt wishes.    OBJECTIVE Temp:  [95 F (35 C)-99.9 F (37.7 C)] 99.9 F (37.7 C) (09/09 0704) Pulse Rate:  [71-90] 85 (09/09 0704) Cardiac Rhythm: Normal sinus rhythm (09/09 0100) Resp:  [13-34] 16 (09/09 0704) BP: (93-220)/(64-131) 124/70 (09/09 0704) SpO2:  [98 %-100 %] 100 % (09/09 0704) FiO2 (%):  [40 %-100 %] 40 % (09/09 0704) Weight:  [127 lb 6.8 oz (57.8 kg)] 127 lb 6.8 oz (57.8 kg) (09/08 2027)  CBC:   Recent Labs Lab 09/07/2017 2019 08/19/2017 2026 08/23/17 0615  WBC 6.6  --  11.3*  NEUTROABS 5.0  --   --   HGB 12.6 13.3 13.0  HCT 36.2 39.0 37.7  MCV 90.5  --  90.8  PLT 310  --  326    Basic Metabolic Panel:   Recent Labs Lab 08/27/2017 2019 09/09/2017 2026 08/23/17 0615  NA 124* 128* 131*  K 3.0* 3.1* 3.4*  CL 91* 91* 100*  CO2 24  --  22  GLUCOSE 154* 150* 117*  BUN 11 12 6   CREATININE 0.45 0.40* 0.43*  CALCIUM 8.4*  --  8.4*    Lipid Panel:     Component Value Date/Time   TRIG 64 09/12/2017 0058   HgbA1c: No results found for: HGBA1C Urine Drug Screen: No results found for:  LABOPIA, COCAINSCRNUR, LABBENZ, AMPHETMU, THCU, LABBARB  Alcohol Level No results found for: Grenelefe I have personally reviewed the radiological images below and agree with the radiology interpretations.  Ct Head Wo Contrast 08/18/2017 IMPRESSION:  Acute right thalamic hemorrhage with intraventricular extension. Trapping of the right temporal horn.   08/23/2017 IMPRESSION:  1. Evolving large RIGHT thalamic hematoma extending to midbrain with similar edema and mass effect including 8 mm RIGHT to LEFT midline shift.  2. Similar intraventricular extension of hematoma with stable moderate hydrocephalus.   08/23/2017 1. Evolving large RIGHT thalamic intraparenchymal hematoma extending to the midbrain with similar edema and mass effect.  2. Intraventricular extension of hemorrhage with worsening moderate hydrocephalus.   Ct Cervical Spine Wo Contrast 09/05/2017 IMPRESSION:  Mild degenerative disc disease in the cervical spine without acute fracture or subluxation.   TTE pending    PHYSICAL EXAM Vitals:   08/23/17 0500 08/23/17 0600 08/23/17 0700 08/23/17 0704  BP: 133/73 (!) 142/77 124/70 124/70  Pulse: 73 90 75 85  Resp: 16 (!) 21 16 16   Temp: 99.9 F (37.7 C) 99.7 F (37.6 C) 99.9 F (37.7 C) 99.9 F (37.7  C)  TempSrc:      SpO2: 100% 100% 100% 100%  Weight:      Height:        Temp:  [95 F (35 C)-99.9 F (37.7 C)] 99.9 F (37.7 C) (09/09 0704) Pulse Rate:  [71-90] 85 (09/09 0704) Resp:  [13-34] 16 (09/09 0704) BP: (93-220)/(64-131) 124/70 (09/09 0704) SpO2:  [98 %-100 %] 100 % (09/09 0957) FiO2 (%):  [40 %-100 %] 40 % (09/09 0704) Weight:  [127 lb 6.8 oz (57.8 kg)] 127 lb 6.8 oz (57.8 kg) (09/08 2027)  General - Well nourished, well developed, intubated.  Ophthalmologic - Fundi not visualized due to noncooperation.  Cardiovascular - Regular rate and rhythm.  Neuro - intubated just off sedation, on vent. Eyes not open on voice or pain, not following commands.  On forced eye opening, eye middle position, 74mm bilateral pupil, very sluggish to negative response to light, weak corneals bilaterally and positive gag and cough. Not blinking to visual threat bilaterally, not tracking. RUE and RLE spontaneous movement. On pain stimulation, RUE and RLE withdraw against gravity, LUE and LLE mild withdraw 2/5. DTR 1+ and no babinski. Sensation, coordination and gait not tested.   ASSESSMENT/PLAN Ms. Teresa Morales is a 76 y.o. female with history of hyponatremia, hypertension, and previous SIADH with baseline hyponatremia presenting with acute left hemiparesis.   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  Resultant  Intubated, left hemiparesis  CT head - Evolving large RIGHT thalamic hematoma extending to midbrain with similar edema and mass effect   CT repeat x 2 showed developing but stable moderate hydrocephalus  2D Echo  - pending  HgbA1c - pending  VTE prophylaxis - SCDs Diet NPO time specified  No antithrombotic prior to admission, now on No antithrombotic.   Ongoing aggressive stroke risk factor management  Therapy recommendations:  pending  Disposition:  Pending  Hydrocephalus   CT repeat x 2 showed stable developing hydrocephalus  Family hesitant with any procedure as per pt wishes  Close monitoring  Put on 3% saline  Repeat CT 6pm today  Hyponatremia at baseline   Na 124-128 at baseline  On 3% saline as well as 0.9% saline with cardene  Avoid abrupt Na increase  Limit Na increase to < 12 in 24h period.  Na check Q4h  Hypertension  Stable  BP goal < 140  On cardene  Long-term BP goal normotensive  Other Stroke Risk Factors  Advanced age  Other Active Problems  Hypokalemia - supplemented  Mild leukocytosis - 11.3  Hyperglycemia - check hemoglobin A1c  Potential pulmonary nodule - dedicated PA and lateral chest x-ray recommended for follow-up  Hospital  day # 1  This patient is critically ill due to right thalamic large ICH with IVH, hydrocephalus, HTN, hyponatremia and at significant risk of neurological worsening, death form recurrent ICH, hematoma expansion, hydrocephalus, brain edema, cerebral herniation. This patient's care requires constant monitoring of vital signs, hemodynamics, respiratory and cardiac monitoring, review of multiple databases, neurological assessment, discussion with family, other specialists and medical decision making of high complexity. I spent 40 minutes of neurocritical care time in the care of this patient. I had long discussion with husband and daughter at bedside, updated pt current condition, treatment plan and potential prognosis. They expressed understanding and appreciation.   Rosalin Hawking, MD PhD Stroke Neurology 08/23/2017 12:09 PM    To contact Stroke Continuity provider, please refer to [email or web address] After hours, contact  General Neurology

## 2017-08-23 NOTE — Progress Notes (Signed)
SLP Cancellation Note  Patient Details Name: Teresa Morales MRN: [id number] DOB: Sep 16, 1941   Cancelled treatment:       Reason Eval/Treat Not Completed: Medical issues which prohibited therapy  The patient is currently intubated.  ST will follow up next date.    Shelly Flatten, MA, CCC-SLP Acute Rehab SLP [phone] Lamar Sprinkles 08/23/2017, 1:55 PM

## 2017-08-23 NOTE — Progress Notes (Signed)
On arrival, patient sedated with propofol, after pausing, she is not following commands, she has a fixed mid-position pupil on the right, but the left is reactive though sluggish, she withdraws on the right, flexion vs minimal withdrawal on the left.   I have discussed with neurosurgery and her husband.   Her husband states that he is not certain that she would want to live in a debilitated state. He is hesitant to make a decision about EVD at this time. I discussed with neurosurgery, and I think that the exam change predominantly represents the midbrain involvement as opposed to change due to the hydrocephalus. I think it is reasonable to start 3% normal saline and continue to monitor exam/imaging.  Roland Rack, MD [facility] [phone]  If 7pm- 7am, please page neurology on call as listed in [place].

## 2017-08-23 NOTE — Progress Notes (Signed)
OT Cancellation Note  Patient Details Name: Teresa Morales MRN: [id number] DOB: [birth year]   Cancelled Treatment:    Reason Eval/Treat Not Completed: Patient not medically ready (active bedrest orders).  Binnie Kand M.S., OTR/L Pager: [phone]  08/23/2017, 9:32 AM

## 2017-08-23 NOTE — Progress Notes (Signed)
Patient transported from 4N17 to CT and back with no complications. °

## 2017-08-23 NOTE — Consult Note (Addendum)
PULMONARY / CRITICAL CARE MEDICINE   Name: Teresa Morales MRN: [id number] DOB: [birth year]    ADMISSION DATE:  08/20/2017 CONSULTATION DATE:  09/09/2017   REFERRING MD:  Leonel Ramsay  CHIEF COMPLAINT:  Left sided weakness   HISTORY OF PRESENT ILLNESS:   76 yr old lady with recent diagnosis of HTN was well until tonight at 6:30pm when she allof a sudden fell in her room and hit her head. She was complaining of headache and then started having left sided weakness. Husband attended to her immediately and called 911. On arrival to the ED there was concern about her protecting her airway so she was intubated after calling code stroke. CT head showed right thalamic hemorrhage with ventricular extension.   As per husband she is not on any blood thinners. She was just recently started on antihypertensive and her BP has been always 140 or below.    Subjective: NS evaluation, no intervention recommended,. apneic on vent  VITAL SIGNS: BP 124/70   Pulse 85   Temp 99.9 F (37.7 C)   Resp 16   Ht 5\' 3"  (1.6 m)   Wt 57.8 kg (127 lb 6.8 oz)   LMP 12/15/1993 (Approximate)   SpO2 100%   BMI 22.57 kg/m   HEMODYNAMICS:    VENTILATOR SETTINGS: Vent Mode: PRVC FiO2 (%):  [40 %-100 %] 40 % Set Rate:  [16 bmp] 16 bmp Vt Set:  [500 mL] 500 mL PEEP:  [5 cmH20] 5 cmH20 Plateau Pressure:  [11 cmH20-16 cmH20] 14 cmH20  INTAKE / OUTPUT: I/O last 3 completed shifts: In: 1092.5 [I.V.:1092.5] Out: 1450 [Urine:1450]  PHYSICAL EXAMINATION: General: not awake, off sedation Neuro: per slug, not fc, wd pain HEENT: ett, collar PULM: CTA CV:  s1 s2 RRR GI: soft, BS wnl no  Extremities: no edema, no rash   LABS:  BMET  Recent Labs Lab 08/21/2017 2019 08/21/2017 2026 08/23/17 0615  NA 124* 128* 131*  K 3.0* 3.1* 3.4*  CL 91* 91* 100*  CO2 24  --  22  BUN 11 12 6   CREATININE 0.45 0.40* 0.43*  GLUCOSE 154* 150* 117*    Electrolytes  Recent Labs Lab 09/04/2017 2019 08/23/17 0615  CALCIUM  8.4* 8.4*    CBC  Recent Labs Lab 09/12/2017 2019 09/02/2017 2026 08/23/17 0615  WBC 6.6  --  11.3*  HGB 12.6 13.3 13.0  HCT 36.2 39.0 37.7  PLT 310  --  302    Coag's  Recent Labs Lab 09/04/2017 2019  APTT 28  INR 0.93    Sepsis Markers No results for input(s): LATICACIDVEN, PROCALCITON, O2SATVEN in the last 168 hours.  ABG  Recent Labs Lab  2141  PHART 7.436  PCO2ART 43.4  PO2ART 495.0*    Liver Enzymes  Recent Labs Lab 08/31/2017 2019  AST 30  ALT 28  ALKPHOS 64  BILITOT 0.4  ALBUMIN 3.8    Cardiac Enzymes No results for input(s): TROPONINI, PROBNP in the last 168 hours.  Glucose  Recent Labs Lab 09/12/2017 2018 09/07/2017 2308 08/23/17 0417 08/23/17 0703  GLUCAP 162* 130* 129* 99    Imaging Ct Head Wo Contrast  Result Date: 08/23/2017 CLINICAL DATA:  Follow-up intracranial hemorrhage. EXAM: CT HEAD WITHOUT CONTRAST TECHNIQUE: Contiguous axial images were obtained from the base of the skull through the vertex without intravenous contrast. COMPARISON:  CT HEAD August 23, 2017 at 0046 hours FINDINGS: BRAIN: 2.9 x 3.4 cm RIGHT thalamic intraparenchymal hematoma is stable in size with similar extension  to the midbrain, resulting in cerebral aqua duct effacement. Similar surrounding vasogenic edema and regional mass effect. Intraventricular extension again noted with blood products within lateral ventricles, third and fourth ventricles. Similar moderate hydrocephalus. Stable 8 mm RIGHT to LEFT midline shift. Narrowed basal cisterns. No acute large vascular territory infarcts. No abnormal extra-axial fluid collections. VASCULAR: Mild calcific atherosclerosis of the carotid siphons. SKULL: No skull fracture. No significant scalp soft tissue swelling. SINUSES/ORBITS: Trace paranasal sinus mucosal thickening. Mastoid air cells are well aerated.The included ocular globes and orbital contents are non-suspicious. OTHER: None. IMPRESSION: 1. Evolving large RIGHT  thalamic hematoma extending to midbrain with similar edema and mass effect including 8 mm RIGHT to LEFT midline shift. 2. Similar intraventricular extension of hematoma with stable moderate hydrocephalus. Electronically Signed   By: Elon Alas M.D.   On: 08/23/2017 06:24   Ct Head Wo Contrast  Result Date: 08/23/2017 CLINICAL DATA:  Altered mental status.  Follow-up hemorrhage. EXAM: CT HEAD WITHOUT CONTRAST TECHNIQUE: Contiguous axial images were obtained from the base of the skull through the vertex without intravenous contrast. COMPARISON:  CT HEAD August 22, 2017 FINDINGS: BRAIN: 3.4 x 2.9 cm RIGHT thalamic intraparenchymal hematoma relatively stable in size, with with similar extension to the midbrain effacing the cerebral aqua duct. Similar surrounding low-density vasogenic edema. Intraventricular extension again noted. Worsening hydrocephalus with dilated temporal horns (LEFT temporal horn was 6 mm, now 8 mm). 8 mm RIGHT to LEFT midline shift is similar. Narrowed basal cisterns. No acute large vascular territory infarct. No abnormal extra-axial fluid collections. VASCULAR: Mild calcific atherosclerosis of the carotid siphons. SKULL: No skull fracture. No significant scalp soft tissue swelling. SINUSES/ORBITS: Trace paranasal sinus mucosal thickening. Mastoid air cells are well aerated. The included ocular globes and orbital contents are non-suspicious. OTHER: None. IMPRESSION: 1. Evolving large RIGHT thalamic intraparenchymal hematoma extending to the midbrain with similar edema and mass effect. 2. Intraventricular extension of hemorrhage with worsening moderate hydrocephalus. 3. Acute findings discussed with and reconfirmed by Dr.MCNE[facility] on 08/23/2017 at 1:20 am. Electronically Signed   By: Elon Alas M.D.   On: 08/23/2017 01:29   Ct Cervical Spine Wo Contrast  Result Date: 09/12/2017 CLINICAL DATA:  Code stroke leading to fall. EXAM: CT CERVICAL SPINE WITHOUT CONTRAST  TECHNIQUE: Multidetector CT imaging of the cervical spine was performed without intravenous contrast. Multiplanar CT image reconstructions were also generated. COMPARISON:  Head CT performed concurrently and reported separately. FINDINGS: Alignment: Normal. Skull base and vertebrae: No acute fracture. Vertebral body heights are maintained. The dens and skull base are intact. Soft tissues and spinal canal: None subarachnoid hemorrhage does not extend into the cervical canal. No prevertebral soft tissue edema. Disc levels: Disc space narrowing and endplate spurring at D6-L8 and to a lesser extent C6-C7. Upper chest: Mild biapical pleuroparenchymal scarring. Endotracheal tube in place. Other: None. IMPRESSION: Mild degenerative disc disease in the cervical spine without acute fracture or subluxation. Electronically Signed   By: Jeb Levering M.D.   On: 09/02/2017 21:06   Dg Chest Port 1 View  Result Date: 08/23/2017 CLINICAL DATA:  76 year old female currently intubated EXAM: PORTABLE CHEST 1 VIEW COMPARISON:  Prior chest x-ray 08/31/2017 FINDINGS: The tip of the endotracheal tube is at the level of the clavicles 7.4 cm above the carina. A nasogastric tube is present in overlies the gastric fundus. The proximal side hole can just be identified at the inferior edge of the film. Cardiac and mediastinal contours remain unchanged in within normal limits.  Developing nodular opacity in the periphery of the right lung base. Otherwise, the lungs are clear. Chronic bronchitic changes and mild interstitial prominence are similar compared to prior. Calcified right hilar lymphadenopathy. No acute osseous abnormality. IMPRESSION: 1. Stable and satisfactory position of support apparatus. 2. Developing patchy nodular opacity in the periphery of the right lung base may represent a region of developing pneumonia, or potentially a pulmonary nodule which was not well seen on yesterday's chest x-ray. Recommend dedicated PA and  lateral chest x-ray for further evaluation when the patient is clinically able. 3. Evidence of old granulomatous disease with calcified right hilar lymph nodes. Electronically Signed   By: Jacqulynn Cadet M.D.   On: 08/23/2017 08:22   Dg Chest Portable 1 View  Result Date: 08/21/2017 CLINICAL DATA:  Intubation EXAM: PORTABLE CHEST 1 VIEW COMPARISON:  Portable exam 2056 hours without priors for comparison FINDINGS: Tip of endotracheal tube projects 5.1 cm above carina. Orogastric tube coiled in proximal stomach. Enlargement of cardiac silhouette. Mediastinal contours and pulmonary vascularity normal. Calcified adenopathy at RIGHT hilum. Lungs grossly clear. No infiltrate, pleural effusion or pneumothorax. Bones demineralized. IMPRESSION: Tube positions as above. Old granulomatous disease. No acute abnormalities. Electronically Signed   By: Lavonia Dana M.D.   On: 08/18/2017 21:02   Ct Head Code Stroke Wo Contrast  Result Date: 08/18/2017 CLINICAL DATA:  Code stroke.  Left-sided weakness. EXAM: CT HEAD WITHOUT CONTRAST TECHNIQUE: Contiguous axial images were obtained from the base of the skull through the vertex without intravenous contrast. COMPARISON:  None. FINDINGS: Brain: An acute hemorrhage centered in the right thalamus measures 3.3 x 3.0 x 4.2 cm (estimated volume 21 cc) with extension into the right midbrain. There is mild surrounding edema. There is intraventricular extension with a large amount of blood in the right lateral ventricle and a smaller amount in the left lateral ventricle, third ventricle, and fourth ventricle. There is mild dilatation of the right temporal horn. Focal midline shift at the level of the thalami/third ventricle measures 8 mm. No acute cortically based infarct or extra-axial fluid collection is identified. Vascular: Calcified atherosclerosis at the skullbase. No hyperdense vessel. Intracranial arterial dolichoectasia. Skull: No fracture or focal osseous lesion.  Sinuses/Orbits: Minimal mucosal thickening in the maxillary sinuses. Clear mastoid air cells. Unremarkable orbits. Other: None. ASPECTS [facility] Stroke Program Early CT Score) Not scored due to hemorrhage. 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 Trapping of the right temporal horn. Critical Value/emergent results were called by telephone at the time of interpretation on 09/13/2017 at 8:48 pm to Dr. Leonel Ramsay, who verbally acknowledged these results. Electronically Signed   By: Logan Bores M.D.   On: 09/05/2017 20:50     STUDIES:  Ct head 9/8 right thalamic ICH with ventricular extension    SIGNIFICANT EVENTS: Intubated on 9/8  LINES/TUBES: ETT 08/20/2017  ASSESSMENT / PLAN:  Acute intracranial hemorrhage/thalamic hemorrhage with ventricular extension. Acute respiratory failure requiring mechanical ventilation for airway protection Hypertensive emergency Acute encephalopathy secondary to [place] Plan - Cardene drip and keep SBP 120-140 - consider feeding today -abg reviewed - consider lower MV -wean resulted in apnea related to brain injury -maintain scd -cbg assessments -pcxr reviewed, repeat in am  -K supp -saline only, no free water, chem in am for Na trend further on 3% - avoid any central access -need to discuss code status -dc benzo, if needed re add prop   FAMILY  - Updates: husband and daughter bedside updated by me 9/9  -  Inter-disciplinary family meet or Palliative Care meeting due by:    Ccm time 35 min    Lavon Paganini. Titus Mould, MD, [place] Pgr: [facility]  Pulmonary and [place] Pager: [phone]  08/23/2017, 8:57 AM   I have had extensive discussions with family . We discussed patients current circumstances and organ failures. We also discussed patient's prior wishes under circumstances such as this. Family has decided to NOT perform  resuscitation if arrest but to continue current medical support for now. No escalation vent, pressors.

## 2017-08-24 ENCOUNTER — Inpatient Hospital Stay (HOSPITAL_COMMUNITY): Payer: Medicare Other

## 2017-08-24 DIAGNOSIS — I361 Nonrheumatic tricuspid (valve) insufficiency: Secondary | ICD-10-CM

## 2017-08-24 DIAGNOSIS — D72829 Elevated white blood cell count, unspecified: Secondary | ICD-10-CM

## 2017-08-24 DIAGNOSIS — E876 Hypokalemia: Secondary | ICD-10-CM

## 2017-08-24 LAB — CBC
HEMATOCRIT: 31.9 % — AB (ref 36.0–46.0)
HEMATOCRIT: 37.6 % (ref 36.0–46.0)
HEMOGLOBIN: 10.8 g/dL — AB (ref 12.0–15.0)
HEMOGLOBIN: 12.8 g/dL (ref 12.0–15.0)
MCH: 31.7 pg (ref 26.0–34.0)
MCH: 31.6 pg (ref 26.0–34.0)
MCHC: 33.9 g/dL (ref 30.0–36.0)
MCHC: 34 g/dL (ref 30.0–36.0)
MCV: 93.5 fL (ref 78.0–100.0)
MCV: 92.8 fL (ref 78.0–100.0)
Platelets: 268 10*3/uL (ref 150–400)
Platelets: 312 10*3/uL (ref 150–400)
RBC: 3.41 MIL/uL — ABNORMAL LOW (ref 3.87–5.11)
RBC: 4.05 MIL/uL (ref 3.87–5.11)
RDW: 13.8 % (ref 11.5–15.5)
RDW: 13.9 % (ref 11.5–15.5)
WBC: 9.3 10*3/uL (ref 4.0–10.5)
WBC: 10.6 10*3/uL — ABNORMAL HIGH (ref 4.0–10.5)

## 2017-08-24 LAB — ECHOCARDIOGRAM COMPLETE
AOASC: 33 cm
Area-P 1/2: 4.68 cm2
E decel time: 162 msec
EERAT: 6.09
FS: 35 % (ref 28–44)
HEIGHTINCHES: 63 in
IV/PV OW: 1.13
LA diam index: 1.85 cm/m2
LA vol A4C: 26.7 ml
LA vol index: 17 mL/m2
LA vol: 27.5 mL
LASIZE: 30 mm
LEFT ATRIUM END SYS DIAM: 30 mm
LV E/e' medial: 6.09
LV PW d: 10.9 mm — AB (ref 0.6–1.1)
LV e' LATERAL: 11 cm/s
LVEEAVG: 6.09
LVOT area: 2.01 cm2
LVOT diameter: 16 mm
Lateral S' vel: 22.9 cm/s
MV Dec: 162
MV pk E vel: 67 m/s
MVPKAVEL: 99.4 m/s
P 1/2 time: 47 ms
RV TAPSE: 28.1 mm
Reg peak vel: 303 cm/s
TDI e' lateral: 11
TDI e' medial: 8.7
TR max vel: 303 cm/s
Weight: 2067.03 oz

## 2017-08-24 LAB — BASIC METABOLIC PANEL
Anion gap: 10 (ref 5–15)
Anion gap: 7 (ref 5–15)
BUN: 11 mg/dL (ref 6–20)
BUN: 13 mg/dL (ref 6–20)
BUN: 16 mg/dL (ref 6–20)
CALCIUM: 7.1 mg/dL — AB (ref 8.9–10.3)
CALCIUM: 8.6 mg/dL — AB (ref 8.9–10.3)
CALCIUM: 8.9 mg/dL (ref 8.9–10.3)
CHLORIDE: 116 mmol/L — AB (ref 101–111)
CO2: 21 mmol/L — AB (ref 22–32)
CO2: 19 mmol/L — AB (ref 22–32)
CO2: 25 mmol/L (ref 22–32)
CREATININE: 0.4 mg/dL — AB (ref 0.44–1.00)
CREATININE: 0.4 mg/dL — AB (ref 0.44–1.00)
Chloride: 116 mmol/L — ABNORMAL HIGH (ref 101–111)
Creatinine, Ser: 0.34 mg/dL — ABNORMAL LOW (ref 0.44–1.00)
GFR calc Af Amer: >60 mL/min (ref >60)
GFR calc non Af Amer: >60 mL/min (ref >60)
GFR calc non Af Amer: >60 mL/min (ref >60)
GFR calc non Af Amer: >60 mL/min (ref >60)
GLUCOSE: 143 mg/dL — AB (ref 65–99)
Glucose, Bld: 128 mg/dL — ABNORMAL HIGH (ref 65–99)
Glucose, Bld: 142 mg/dL — ABNORMAL HIGH (ref 65–99)
Potassium: 2.5 mmol/L — CL (ref 3.5–5.1)
Potassium: 3.2 mmol/L — ABNORMAL LOW (ref 3.5–5.1)
Potassium: 3.2 mmol/L — ABNORMAL LOW (ref 3.5–5.1)
SODIUM: 148 mmol/L — AB (ref 135–145)
Sodium: 175 mmol/L (ref 135–145)
Sodium: 145 mmol/L (ref 135–145)

## 2017-08-24 LAB — GLUCOSE, CAPILLARY
GLUCOSE-CAPILLARY: 110 mg/dL — AB (ref 65–99)
GLUCOSE-CAPILLARY: 144 mg/dL — AB (ref 65–99)
Glucose-Capillary: 137 mg/dL — ABNORMAL HIGH (ref 65–99)
Glucose-Capillary: 155 mg/dL — ABNORMAL HIGH (ref 65–99)
Glucose-Capillary: 144 mg/dL — ABNORMAL HIGH (ref 65–99)
Glucose-Capillary: 153 mg/dL — ABNORMAL HIGH (ref 65–99)

## 2017-08-24 LAB — SODIUM
SODIUM: 143 mmol/L (ref 135–145)
SODIUM: 148 mmol/L — AB (ref 135–145)
SODIUM: 148 mmol/L — AB (ref 135–145)
SODIUM: 149 mmol/L — AB (ref 135–145)
Sodium: 146 mmol/L — ABNORMAL HIGH (ref 135–145)

## 2017-08-24 LAB — HEMOGLOBIN A1C
HEMOGLOBIN A1C: 5.4 % (ref 4.8–5.6)
Mean Plasma Glucose: 108.28 mg/dL

## 2017-08-24 LAB — PHOSPHORUS
PHOSPHORUS: 1.7 mg/dL — AB (ref 2.5–4.6)
PHOSPHORUS: 1.9 mg/dL — AB (ref 2.5–4.6)

## 2017-08-24 LAB — MAGNESIUM
Magnesium: 1.9 mg/dL (ref 1.7–2.4)
Magnesium: 2.2 mg/dL (ref 1.7–2.4)

## 2017-08-24 MED ORDER — HYALURONIDASE HUMAN 150 UNIT/ML IJ SOLN
150.0000 [IU] | Freq: Once | INTRAMUSCULAR | Status: AC
  Administered 2017-08-24: 150 [IU] via SUBCUTANEOUS

## 2017-08-24 MED ORDER — POTASSIUM CHLORIDE 20 MEQ/15ML (10%) PO SOLN
20.0000 meq | ORAL | Status: AC
  Administered 2017-08-24 (×2): 20 meq
  Filled 2017-08-24 (×2): qty 15

## 2017-08-24 MED ORDER — HYALURONIDASE OVINE 200 UNIT/ML IJ SOLN
150.0000 [IU] | Freq: Once | INTRAMUSCULAR | Status: DC
  Filled 2017-08-24: qty 0.75

## 2017-08-24 MED ORDER — NICARDIPINE HCL IN NACL 20-0.86 MG/200ML-% IV SOLN: 3.0000 mg/h | INTRAVENOUS | Status: DC

## 2017-08-24 MED ORDER — VITAL HIGH PROTEIN PO LIQD
1000.0000 mL | ORAL | Status: DC
  Administered 2017-08-24: 1000 mL
  Administered 2017-08-25: 18:00:00
  Administered 2017-08-25: 1000 mL
  Administered 2017-08-25 (×3)
  Administered 2017-08-26: 1000 mL
  Administered 2017-08-26 (×2)

## 2017-08-24 MED ORDER — CLEVIDIPINE BUTYRATE 0.5 MG/ML IV EMUL
0.0000 mg/h | INTRAVENOUS | Status: DC
  Administered 2017-08-24: 1 mg/h via INTRAVENOUS
  Administered 2017-08-24: 4 mg/h via INTRAVENOUS
  Administered 2017-08-25: 6 mg/h via INTRAVENOUS
  Administered 2017-08-25: 1 mg/h via INTRAVENOUS
  Administered 2017-08-25: 6 mg/h via INTRAVENOUS
  Administered 2017-08-25: 7 mg/h via INTRAVENOUS
  Administered 2017-08-26: 5 mg/h via INTRAVENOUS
  Administered 2017-08-26: 6 mg/h via INTRAVENOUS
  Administered 2017-08-26: 5 mg/h via INTRAVENOUS
  Administered 2017-08-26: 7 mg/h via INTRAVENOUS
  Administered 2017-08-27: 3 mg/h via INTRAVENOUS
  Filled 2017-08-24 (×12): qty 50

## 2017-08-24 NOTE — Progress Notes (Signed)
[facility] ADULT ICU REPLACEMENT PROTOCOL FOR AM LAB REPLACEMENT ONLY  The patient does apply for the [facility] Adult ICU Electrolyte Replacment Protocol based on the criteria listed below:   1. Is GFR >/= 40 ml/min? Yes.    Patient's GFR today is >60 2. Is urine output >/= 0.5 ml/kg/hr for the last 6 hours? Yes.   Patient's UOP is 2.1 ml/kg/hr 3. Is BUN < 60 mg/dL? Yes.    Patient's BUN today is 19 4. Abnormal electrolyte(s): K 3.2 5. Ordered repletion with: protocol 6. If a panic level lab has been reported, has the CCM MD in charge been notified? No..   Physician:    Ronda Fairly A 08/24/2017 6:27 AM

## 2017-08-24 NOTE — Plan of Care (Signed)
Problem: Nutrition: Goal: Dietary intake will improve Outcome: Progressing Patient currently on tube feeds at 58mL per hour. Katherine Mantle RN

## 2017-08-24 NOTE — Progress Notes (Signed)
  Echocardiogram 2D Echocardiogram has been performed.  Keelynn Furgerson G Michole Lecuyer 08/24/2017, 9:31 AM

## 2017-08-24 NOTE — Progress Notes (Signed)
Per MD order - advanced ETT 3 cm- now resides at 24lip. RN aware.

## 2017-08-24 NOTE — Progress Notes (Addendum)
STROKE TEAM PROGRESS NOTE   SUBJECTIVE (INTERVAL HISTORY) Her husband and daughter are at the bedside.  Pt intubated, on propofol, not open eyes or following commands. CT repeat showed stable hematoma and hydrocephalus. Repeat CT head in am.    OBJECTIVE Temp:  [98.2 F (36.8 C)-100.4 F (38 C)] 99.6 F (37.6 C) (09/10 1554) Pulse Rate:  [80-131] 112 (09/10 1600) Cardiac Rhythm: Sinus tachycardia (09/10 1200) Resp:  [13-34] 19 (09/10 1600) BP: (104-178)/(58-99) 119/73 (09/10 1600) SpO2:  [97 %-100 %] 98 % (09/10 1600) FiO2 (%):  [28 %-30 %] 28 % (09/10 1517) Weight:  [129 lb 3 oz (58.6 kg)] 129 lb 3 oz (58.6 kg) (09/10 0131)  CBC:   Recent Labs Lab 08/23/2017 2019  08/24/17 0150 08/24/17 0337  WBC 6.6  < > 9.3 10.6*  NEUTROABS 5.0  --   --   --   HGB 12.6  < > 10.8* 12.8  HCT 36.2  < > 31.9* 37.6  MCV 90.5  < > 93.5 92.8  PLT 310  < > 268 312  < > = values in this interval not displayed.  Basic Metabolic Panel:   Recent Labs Lab 08/23/17 1819  08/24/17 0150 08/24/17 0337 08/24/17 0807 08/24/17 1145  NA 138  < > 175* 145 148* 149*  148*  K  --   --  2.5* 3.2*  --  3.2*  CL  --   --  >130* 116*  --  116*  CO2  --   --  21* 19*  --  25  GLUCOSE  --   --  128* 143*  --  142*  BUN  --   --  11 13  --  16  CREATININE  --   --  0.34* 0.40*  --  0.40*  CALCIUM  --   --  7.1* 8.6*  --  8.9  MG 2.1  --  1.9  --   --   --   PHOS 1.7*  --  1.7*  --   --   --   < > = values in this interval not displayed.  Lipid Panel:     Component Value Date/Time   TRIG 64 08/31/2017 0058   HgbA1c:  Lab Results  Component Value Date   HGBA1C 5.4 08/24/2017   Urine Drug Screen: No results found for: LABOPIA, COCAINSCRNUR, LABBENZ, AMPHETMU, THCU, LABBARB  Alcohol Level No results found for: [place] I have personally reviewed the radiological images below and agree with the radiology interpretations.  Ct Head Wo Contrast 09/09/2017 IMPRESSION:  Acute right thalamic  hemorrhage with intraventricular extension. Trapping of the right temporal horn.   08/23/2017 IMPRESSION:  1. Evolving large RIGHT thalamic hematoma extending to midbrain with similar edema and mass effect including 8 mm RIGHT to LEFT midline shift.  2. Similar intraventricular extension of hematoma with stable moderate hydrocephalus.   08/23/2017 1. Evolving large RIGHT thalamic intraparenchymal hematoma extending to the midbrain with similar edema and mass effect.  2. Intraventricular extension of hemorrhage with worsening moderate hydrocephalus.   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   TTE - Left ventricle: The cavity size was normal. Wall thickness was   normal. Systolic function was vigorous. The estimated ejection   fraction was in the range of 65% to 70%. Doppler parameters are   consistent with abnormal left ventricular relaxation (grade 1  diastolic dysfunction).   PHYSICAL EXAM Vitals:   08/24/17 1517 08/24/17 1530 08/24/17 1554 08/24/17 1600  BP:  116/76  119/73  Pulse:  (!) 112 (!) 114 (!) 112  Resp:  19 20 19   Temp:   99.6 F (37.6 C)   TempSrc:   Oral   SpO2: 98% 98% 98% 98%  Weight:      Height:        Temp:  [98.2 F (36.8 C)-100.4 F (38 C)] 99.6 F (37.6 C) (09/10 1554) Pulse Rate:  [80-131] 112 (09/10 1600) Resp:  [13-34] 19 (09/10 1600) BP: (104-178)/(58-99) 119/73 (09/10 1600) SpO2:  [97 %-100 %] 98 % (09/10 1600) FiO2 (%):  [28 %-30 %] 28 % (09/10 1517) Weight:  [129 lb 3 oz (58.6 kg)] 129 lb 3 oz (58.6 kg) (09/10 0131)  General - Well nourished, well developed, intubated.  Ophthalmologic - Fundi not visualized due to noncooperation.  Cardiovascular - Regular rate and rhythm.  Neuro - intubated, on sedation, on vent. Eyes not open to voice or pain, not following commands. On forced eye opening, eye middle position, 44mm bilateral pupil,  sluggish to light, weak corneals bilaterally and positive gag and cough. Not blinking to visual threat bilaterally, not tracking. RUE and RLE spontaneous movement. On pain stimulation, RUE and RLE withdraw against gravity, LUE extension and LLE mild withdraw. DTR 1+ and no babinski. Sensation, coordination and gait not tested.   ASSESSMENT/PLAN Ms. Teresa Morales is a 76 y.o. female with history of hyponatremia, hypertension, and previous SIADH with baseline hyponatremia presenting with acute left hemiparesis.   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  Resultant  Intubated, left hemiparesis  CT head - Evolving large RIGHT thalamic hematoma extending to midbrain with similar edema and mass effect   CT repeat x 2 showed developing but stable moderate hydrocephalus  2D Echo  EF 65-70%  HgbA1c 5.4  VTE prophylaxis - SCDs Diet NPO time specified  No antithrombotic prior to admission, now on No antithrombotic.   Ongoing aggressive stroke risk factor management  Therapy recommendations:  pending  Disposition:  Pending  Hydrocephalus   CT repeat x 3 showed stable developing hydrocephalus  Family hesitant with any procedure as per pt wishes  Close monitoring  off 3% saline now   Na at goal  Repeat CT in am  Hyponatremia at baseline   Na 124-128 at baseline  Off 3% saline now  On NS @ 40  Avoid abrupt Na increase  Limit Na increase to < 12 in 24h period.  Na check Q4h  Na goal 145-150  Respiratory failure  Intubated on vent  CCM following  Hypertension  Stable  BP goal < 140  On cleviprex Long-term BP goal normotensive  Other Stroke Risk Factors  Advanced age  Other Active Problems  Hypokalemia - supplemented  Mild leukocytosis - 11.3->10.6  Potential pulmonary nodule - dedicated PA and lateral chest x-ray recommended for follow-up  Hospital day # 2  This patient is critically ill due to  right thalamic large ICH with IVH, hydrocephalus, HTN, hyponatremia and at significant risk of neurological worsening, death form recurrent ICH, hematoma expansion, hydrocephalus, brain edema, cerebral herniation. This patient's care requires constant monitoring of vital signs, hemodynamics, respiratory and cardiac monitoring, review of multiple databases, neurological assessment, discussion with family, other specialists and medical decision making of high complexity. I spent 40 minutes of neurocritical care time in the care of this  patient. I had long discussion with husband and daughter at bedside, updated pt current condition, treatment plan and potential prognosis. They expressed understanding and appreciation.   Rosalin Hawking, MD PhD Stroke Neurology 08/24/2017 4:25 PM    To contact Stroke Continuity provider, please refer to [email or web address] After hours, contact General Neurology

## 2017-08-24 NOTE — Progress Notes (Signed)
OT Cancellation Note  Patient Details Name: KILIE RUND MRN: [id number] DOB: Sep 22, 1941   Cancelled Treatment:    Reason Eval/Treat Not Completed: Patient not medically ready. Pt continues on bedrest. Please update activity orders when appropriate for therapy. Thanks  [place], OT/L  [phone] 08/24/2017 08/24/2017, 7:23 AM

## 2017-08-24 NOTE — Progress Notes (Signed)
PULMONARY / CRITICAL CARE MEDICINE   Name: Teresa Morales MRN: [id number] DOB: 1941/02/18    ADMISSION DATE:  08/18/2017 CONSULTATION DATE:  09/12/2017   REFERRING MD:  Leonel Ramsay  CHIEF COMPLAINT:  Left sided weakness   HISTORY OF PRESENT ILLNESS:   76 yr old lady with recent diagnosis of HTN was well until tonight at 6:30pm when she allof a sudden fell in her room and hit her head. She was complaining of headache and then started having left sided weakness. Husband attended to her immediately and called 911. On arrival to the ED there was concern about her protecting her airway so she was intubated after calling code stroke. CT head showed right thalamic hemorrhage with ventricular extension.    Subjective/Overnight:  No sig change overnight.  Tachy to 140's when propofol off but remains unresponsive.   VITAL SIGNS: BP (!) 170/99   Pulse (!) 102   Temp 98.2 F (36.8 C) (Axillary)   Resp 20   Ht 5\' 3"  (1.6 m)   Wt 58.6 kg (129 lb 3 oz)   LMP 12/15/1993 (Approximate)   SpO2 100%   BMI 22.88 kg/m   HEMODYNAMICS:    VENTILATOR SETTINGS: Vent Mode: PRVC FiO2 (%):  [30 %] 30 % Set Rate:  [12 bmp] 12 bmp Vt Set:  [420 mL] 420 mL PEEP:  [5 cmH20] 5 cmH20 Plateau Pressure:  [9 cmH20-11 cmH20] 9 cmH20  INTAKE / OUTPUT: I/O last 3 completed shifts: In: 2950.9 [I.V.:2790.9; NG/GT:160] Out: 3950 [Urine:3950]  PHYSICAL EXAMINATION: General: not awake, off sedation Neuro: pupils 30mm, non reactive, unresponsive, no gag  HEENT: ett PULM: resps even non labored on vent, diminished bases otherwise clear, does not breathe over set rate  CV:  s1 s2 RRR GI: soft, BS wnl no  Extremities: no edema, no rash   LABS:  BMET  Recent Labs Lab 08/23/17 0615  08/24/17 0150 08/24/17 0337 08/24/17 0807  NA 131*  < > 175* 145 148*  K 3.4*  --  2.5* 3.2*  --   CL 100*  --  >130* 116*  --   CO2 22  --  21* 19*  --   BUN 6  --  11 13  --   CREATININE 0.43*  --  0.34* 0.40*  --    GLUCOSE 117*  --  128* 143*  --   < > = values in this interval not displayed.  Electrolytes  Recent Labs Lab 08/23/17 0615 08/23/17 0927 08/23/17 1819 08/24/17 0150 08/24/17 0337  CALCIUM 8.4*  --   --  7.1* 8.6*  MG  --  1.9 2.1 1.9  --   PHOS  --  2.4* 1.7* 1.7*  --     CBC  Recent Labs Lab 08/23/17 0615 08/24/17 0150 08/24/17 0337  WBC 11.3* 9.3 10.6*  HGB 13.0 10.8* 12.8  HCT 37.7 31.9* 37.6  PLT 302 268 312    Coag's  Recent Labs Lab 08/15/2017 2019  APTT 28  INR 0.93    Sepsis Markers No results for input(s): LATICACIDVEN, PROCALCITON, O2SATVEN in the last 168 hours.  ABG  Recent Labs Lab 08/16/2017 2141  PHART 7.436  PCO2ART 43.4  PO2ART 495.0*    Liver Enzymes  Recent Labs Lab  2019  AST 30  ALT 28  ALKPHOS 64  BILITOT 0.4  ALBUMIN 3.8    Cardiac Enzymes No results for input(s): TROPONINI, PROBNP in the last 168 hours.  Glucose  Recent Labs Lab 08/23/17 1307  08/23/17 1626 08/23/17 1916 08/23/17 2325 08/24/17 0328 08/24/17 0741  GLUCAP 125* 116* 135* 115* 137* 110*    Imaging Ct Head Wo Contrast  Result Date: 08/23/2017 CLINICAL DATA:  Intracranial hemorrhage follow-up. EXAM: CT HEAD WITHOUT CONTRAST TECHNIQUE: Contiguous axial images were obtained from the base of the skull through the vertex without intravenous contrast. COMPARISON:  08/23/2017 at 0602 hours FINDINGS: Brain: 3.5 x 3.1 cm right thalamic hemorrhage is unchanged in size and is again noted to extend to the midbrain. Surrounding vasogenic edema and 8 mm leftward midline shift are unchanged. Intraventricular extension is again noted with unchanged volume of blood products in the lateral, third, and fourth ventricles. Moderate dilatation of the lateral ventricles is unchanged. The basilar cisterns are patent. No acute large territory infarct or new intracranial hemorrhage is identified. No extra-axial fluid collection. Vascular: Mild calcified atherosclerosis  at the skullbase. Skull: No fracture or suspicious osseous lesion. Sinuses/Orbits: The visualized paranasal sinuses and mastoid air cells are clear. Orbits are unremarkable. Other: None. IMPRESSION: Unchanged right thalamic hemorrhage with intraventricular extension and hydrocephalus. Electronically Signed   By: Logan Bores M.D.   On: 08/23/2017 18:33   Dg Chest Port 1 View  Result Date: 08/24/2017 CLINICAL DATA:  Acute respiratory failure EXAM: PORTABLE CHEST 1 VIEW COMPARISON:  08/23/2017 FINDINGS: Endotracheal tube terminates 6 cm above the carina. The lungs are clear. Nodular opacity overlying the right lower lung is not evident on the current study, possibly corresponding to a nipple shadow on the prior. No pleural effusion or pneumothorax. Calcified mediastinal and right perihilar nodes. Enteric tube coursing into the stomach. IMPRESSION: Endotracheal tube terminates 6 cm above the carina. No evidence of acute cardiopulmonary disease. Electronically Signed   By: Julian Hy M.D.   On: 08/24/2017 07:18     STUDIES:  Ct head 9/8 right thalamic ICH with ventricular extension    SIGNIFICANT EVENTS: Intubated on 9/8  LINES/TUBES: ETT 9/8>>>  ASSESSMENT / PLAN:  Acute intracranial hemorrhage/thalamic hemorrhage with ventricular extension. Acute respiratory failure requiring mechanical ventilation for airway protection Hypertensive emergency Acute encephalopathy secondary to [place] Hypokalemia   Plan No intervention recommended per neurosurgery  cleviprex for BP goal SBP 120-140 Continue TF  Vent support - 8cc/kg  F/u CXR  F/u ABG 3% saline off  Replete K PRN  Continue propofol  Daily WUA   Discussed with neuro.  Stable CT head overnight.  DNR.  Neuro wants to wait 24 hours and assess status.  Recommend transition to comfort care in next 24-48 hrs if no sig improvement.    FAMILY  - Updates: husband and daughter bedside updated by me 9/9  - Inter-disciplinary family  meet or Palliative Care meeting due by:      Nickolas Madrid, NP 08/24/2017  9:29 AM Pager: [phone] or [phone]

## 2017-08-24 NOTE — Progress Notes (Addendum)
CRITICAL VALUE ALERT  Critical Value:  NA 175, K 2.5, Chloride >130  Date & Time Notied:  2751  Provider Notified: [id number]  Orders Received/Actions taken: Repeat labs   After repeat labs were drawn NA 145, K 3.2, Chloride 116  Orders at this time are to decrease 3% sodium to 59ml/hr

## 2017-08-24 NOTE — Progress Notes (Signed)
PT Cancellation Note  Patient Details Name: Teresa Morales MRN: [id number] DOB: 1941-11-16   Cancelled Treatment:    Reason Eval/Treat Not Completed: Patient not medically ready. Pt currently on bedrest. Will await increased activity orders prior to initiating PT eval.    Thelma Comp 08/24/2017, 7:49 AM   Rolinda Roan, PT, DPT Acute Rehabilitation Services Pager: [phone]

## 2017-08-24 NOTE — Progress Notes (Signed)
Initial Nutrition Assessment  INTERVENTION:   Decrease Vital High Protein to 25 ml/hr (600 ml/day) Continue 30 ml Prostat BID Provides: 800 kcal, 82 grams protein, and 501 ml free water.   TF regimen and propofol at current rate providing 1411 total kcal/day (98 % of kcal needs)   NUTRITION DIAGNOSIS:   Inadequate oral intake related to inability to eat as evidenced by NPO status.  GOAL:   Patient will meet greater than or equal to 90% of their needs  MONITOR:   TF tolerance, Vent status  REASON FOR ASSESSMENT:   Consult, Ventilator Enteral/tube feeding initiation and management  ASSESSMENT:   Pt with recent dx of HTN who was admitted after falling and hitting her head with right thalamic hemorrhage with ventricular extension.    Pt discussed during ICU rounds and with RN.  Spoke with pt's husband. Pt was very health conscious PTA. She had not had any recent weight loss or decline in appetite. Per husband she has lost some muscle as she does not exercise anymore due to pain in her legs.   Patient is currently intubated on ventilator support MV: 8 L/min Temp (24hrs), Avg:99.4 F (37.4 C), Min:98.2 F (36.8 C), Max:100.2 F (37.9 C)  Propofol: 8.6 ml/hr provides: 227 kcal per day Cleviprex: 8 ml/hr provides: 384 kcal per day Medications reviewed and include: SSI, senokot-s Labs reviewed: Na 148 (H), K+ 3.2 (L) Nutrition-Focused physical exam completed. Findings are no fat depletion, mild/moderate muscle depletion acromion bone region and thigh, and no edema.   TF: Vital High Protein @ 40 ml/hr with 30 ml Prostat BID Provides: 1160 kcal, 114 grams protein, and 802 ml free water  Diet Order:  Diet NPO time specified  Skin:  Reviewed, no issues  Last BM:  unknown  Height:   Ht Readings from Last 1 Encounters:  08/19/2017 5\' 3"  (1.6 m)    Weight:   Wt Readings from Last 1 Encounters:  08/24/17 129 lb 3 oz (58.6 kg)    Ideal Body Weight:  52.2 kg  BMI:   Body mass index is 22.88 kg/m.  Estimated Nutritional Needs:   Kcal:  3354  Protein:  75-90 grams  Fluid:  > 1.5 L/day  EDUCATION NEEDS:   No education needs identified at this time  [place], [place], [place] Pager [phone] After Hours Pager

## 2017-08-25 ENCOUNTER — Inpatient Hospital Stay (HOSPITAL_COMMUNITY): Payer: Medicare Other

## 2017-08-25 LAB — GLUCOSE, CAPILLARY
GLUCOSE-CAPILLARY: 141 mg/dL — AB (ref 65–99)
GLUCOSE-CAPILLARY: 138 mg/dL — AB (ref 65–99)
GLUCOSE-CAPILLARY: 140 mg/dL — AB (ref 65–99)
Glucose-Capillary: 138 mg/dL — ABNORMAL HIGH (ref 65–99)
Glucose-Capillary: 140 mg/dL — ABNORMAL HIGH (ref 65–99)
Glucose-Capillary: 147 mg/dL — ABNORMAL HIGH (ref 65–99)

## 2017-08-25 LAB — CBC
HEMATOCRIT: 36 % (ref 36.0–46.0)
Hemoglobin: 12.1 g/dL (ref 12.0–15.0)
MCH: 31.3 pg (ref 26.0–34.0)
MCHC: 33.6 g/dL (ref 30.0–36.0)
MCV: 93 fL (ref 78.0–100.0)
PLATELETS: 294 10*3/uL (ref 150–400)
RBC: 3.87 MIL/uL (ref 3.87–5.11)
RDW: 14.6 % (ref 11.5–15.5)
WBC: 11.1 10*3/uL — ABNORMAL HIGH (ref 4.0–10.5)

## 2017-08-25 LAB — POCT I-STAT 3, ART BLOOD GAS (G3+)
Acid-Base Excess: 6 mmol/L — ABNORMAL HIGH (ref 0.0–2.0)
BICARBONATE: 30.3 mmol/L — AB (ref 20.0–28.0)
O2 Saturation: 100 %
Patient temperature: 100.8
TCO2: 31 mmol/L (ref 22–32)
pCO2 arterial: 42.5 mmHg (ref 32.0–48.0)
pH, Arterial: 7.465 — ABNORMAL HIGH (ref 7.350–7.450)
pO2, Arterial: 395 mmHg — ABNORMAL HIGH (ref 83.0–108.0)

## 2017-08-25 LAB — BASIC METABOLIC PANEL
Anion gap: 9 (ref 5–15)
BUN: 23 mg/dL — ABNORMAL HIGH (ref 6–20)
CALCIUM: 9 mg/dL (ref 8.9–10.3)
CHLORIDE: 118 mmol/L — AB (ref 101–111)
CO2: 23 mmol/L (ref 22–32)
Creatinine, Ser: 0.46 mg/dL (ref 0.44–1.00)
GFR calc Af Amer: >60 mL/min (ref >60)
GFR calc non Af Amer: >60 mL/min (ref >60)
Glucose, Bld: 137 mg/dL — ABNORMAL HIGH (ref 65–99)
Potassium: 2.7 mmol/L — CL (ref 3.5–5.1)
Sodium: 150 mmol/L — ABNORMAL HIGH (ref 135–145)

## 2017-08-25 LAB — SODIUM
SODIUM: 151 mmol/L — AB (ref 135–145)
SODIUM: 151 mmol/L — AB (ref 135–145)
Sodium: 150 mmol/L — ABNORMAL HIGH (ref 135–145)
Sodium: 153 mmol/L — ABNORMAL HIGH (ref 135–145)

## 2017-08-25 MED ORDER — POTASSIUM CHLORIDE 20 MEQ/15ML (10%) PO SOLN
40.0000 meq | ORAL | Status: AC
  Administered 2017-08-25 – 2017-08-26 (×3): 40 meq via ORAL
  Filled 2017-08-25 (×3): qty 30

## 2017-08-25 MED ORDER — AMLODIPINE BESYLATE 10 MG PO TABS
10.0000 mg | ORAL_TABLET | Freq: Every day | ORAL | Status: DC
  Administered 2017-08-26 – 2017-08-27 (×2): 10 mg via ORAL
  Filled 2017-08-25 (×2): qty 1

## 2017-08-25 MED ORDER — METOPROLOL TARTRATE 5 MG/5ML IV SOLN
INTRAVENOUS | Status: AC
  Administered 2017-08-25: 5 mg
  Filled 2017-08-25: qty 5

## 2017-08-25 MED ORDER — METOPROLOL TARTRATE 50 MG PO TABS
50.0000 mg | ORAL_TABLET | Freq: Two times a day (BID) | ORAL | Status: DC
  Administered 2017-08-25 – 2017-08-26 (×3): 50 mg via ORAL
  Filled 2017-08-25 (×4): qty 1

## 2017-08-25 MED ORDER — POTASSIUM CHLORIDE CRYS ER 20 MEQ PO TBCR: 40.0000 meq | EXTENDED_RELEASE_TABLET | ORAL | Status: DC

## 2017-08-25 MED ORDER — METOPROLOL TARTRATE 5 MG/5ML IV SOLN: 5.0000 mg | Freq: Once | INTRAVENOUS | Status: DC

## 2017-08-25 MED ORDER — METOPROLOL TARTRATE 25 MG PO TABS
25.0000 mg | ORAL_TABLET | Freq: Two times a day (BID) | ORAL | Status: DC
  Administered 2017-08-25: 25 mg via ORAL
  Filled 2017-08-25: qty 1

## 2017-08-25 MED ORDER — IRBESARTAN 150 MG PO TABS: 150.0000 mg | ORAL_TABLET | Freq: Every day | ORAL | Status: DC

## 2017-08-25 MED ORDER — FREE WATER
100.0000 mL | Status: DC
  Administered 2017-08-25 – 2017-08-26 (×5): 100 mL

## 2017-08-25 MED ORDER — METOPROLOL TARTRATE 5 MG/5ML IV SOLN: 5.0000 mg | Freq: Once | INTRAVENOUS | Status: AC

## 2017-08-25 MED ORDER — TRIAMTERENE-HCTZ 37.5-25 MG PO TABS: 0.5000 | ORAL_TABLET | Freq: Every day | ORAL | Status: DC

## 2017-08-25 NOTE — Progress Notes (Addendum)
OT Cancellation Note  Patient Details Name: [name] MRN: [id number] DOB: [birth year]   Cancelled Treatment:    Reason Eval/Treat Not Completed: Patient not medically ready. Pt intubated/sedated and on bedrest.  [place], OT/L  [phone] 08/25/2017 08/25/2017, 3:24 PM

## 2017-08-25 NOTE — Progress Notes (Signed)
Pt HR sustaining 140s, going as high as 170s. No PRNs on med list.  E-link MD on call notified of pt HR.  Pt is a new DNR as of 9/9.  No new orders given.  Will continue to monitor.

## 2017-08-25 NOTE — Progress Notes (Signed)
Assisted with transporting PT to [facility] [place] CT from 4N17 (on 100% Fi02)- uneventful. PT is currently back in [place] remains at bedside.

## 2017-08-25 NOTE — Progress Notes (Signed)
Pt too unstable to transport to CT at this time with HR 140s.  Will attempt CT later on.

## 2017-08-25 NOTE — Progress Notes (Signed)
PULMONARY / CRITICAL CARE MEDICINE   Name: Teresa Morales MRN: [id number] DOB: [birth year]    ADMISSION DATE:  09/04/2017 CONSULTATION DATE:  09/12/2017   REFERRING MD:  Leonel Ramsay  CHIEF COMPLAINT:  Left sided weakness   HISTORY OF PRESENT ILLNESS:   76 yr old lady with recent diagnosis of HTN was well until tonight at 6:30pm when she allof a sudden fell in her room and hit her head. She was complaining of headache and then started having left sided weakness. Husband attended to her immediately and called 911. On arrival to the ED there was concern about her protecting her airway so she was intubated after calling code stroke. CT head showed right thalamic hemorrhage with ventricular extension.    Subjective/Overnight:  Slight tachy Remains on vent Had tachycardia , held off CT head  VITAL SIGNS: BP (!) 138/94   Pulse (!) 138   Temp 98.1 F (36.7 C) (Axillary)   Resp 18   Ht 5\' 3"  (1.6 m)   Wt 58.7 kg (129 lb 6.6 oz)   LMP 12/15/1993 (Approximate)   SpO2 97%   BMI 22.92 kg/m   HEMODYNAMICS:    VENTILATOR SETTINGS: Vent Mode: PRVC FiO2 (%):  [28 %] 28 % Set Rate:  [12 bmp] 12 bmp Vt Set:  [420 mL] 420 mL PEEP:  [5 cmH20] 5 cmH20 Plateau Pressure:  [7 cmH20-13 cmH20] 13 cmH20  INTAKE / OUTPUT: I/O last 3 completed shifts: In: 3786.4 [I.V.:2384.7; NG/GT:1401.8] Out: 2750 [Urine:2750]  PHYSICAL EXAMINATION: General: not awake, less responsive Neuro: moves ext , not fc HEENT: ett PULM: coarse CV:  s1 s2 RRT mild tachy now GI: soft, bs wnl no  Extremities: mild gen edema    LABS:  BMET  Recent Labs Lab 08/24/17 0150 08/24/17 0337  08/24/17 1145 08/24/17 1633 08/24/17 2146  NA 175* 145  < > 149*  148* 148* 146*  K 2.5* 3.2*  --  3.2*  --   --   CL >130* 116*  --  116*  --   --   CO2 21* 19*  --  25  --   --   BUN 11 13  --  16  --   --   CREATININE 0.34* 0.40*  --  0.40*  --   --   GLUCOSE 128* 143*  --  142*  --   --   < > = values in this  interval not displayed.  Electrolytes  Recent Labs Lab 08/23/17 1819 08/24/17 0150 08/24/17 0337 08/24/17 1145 08/24/17 1633  CALCIUM  --  7.1* 8.6* 8.9  --   MG 2.1 1.9  --   --  2.2  PHOS 1.7* 1.7*  --   --  1.9*    CBC  Recent Labs Lab 08/23/17 0615 08/24/17 0150 08/24/17 0337  WBC 11.3* 9.3 10.6*  HGB 13.0 10.8* 12.8  HCT 37.7 31.9* 37.6  PLT 302 268 312    Coag's  Recent Labs Lab 08/16/2017 2019  APTT 28  INR 0.93    Sepsis Markers No results for input(s): LATICACIDVEN, PROCALCITON, O2SATVEN in the last 168 hours.  ABG  Recent Labs Lab 09/08/2017 2141  PHART 7.436  PCO2ART 43.4  PO2ART 495.0*    Liver Enzymes  Recent Labs Lab 09/06/2017 2019  AST 30  ALT 28  ALKPHOS 64  BILITOT 0.4  ALBUMIN 3.8    Cardiac Enzymes No results for input(s): TROPONINI, PROBNP in the last 168 hours.  Glucose  Recent  Labs Lab 08/24/17 1150 08/24/17 1553 08/24/17 1928 08/24/17 2319 08/25/17 0304 08/25/17 0739  GLUCAP 144* 155* 144* 153* 140* 141*    Imaging Dg Chest Portable 1 View  Result Date: 08/25/2017 CLINICAL DATA:  Respiratory failure . EXAM: PORTABLE CHEST 1 VIEW COMPARISON:  08/24/2017 . FINDINGS: Endotracheal tube, NG tube stable position. Heart size stable. Stable mediastinal and right hilar lymph nodes. No focal infiltrate. No pleural effusion or pneumothorax. No acute bony abnormality. IMPRESSION: 1. Lines and tubes in stable position. 2.  No acute cardiopulmonary disease. Electronically Signed   By: Marcello Moores  Register   On: 08/25/2017 07:13     STUDIES:  Ct head 9/8 right thalamic ICH with ventricular extension    SIGNIFICANT EVENTS: Intubated on 9/8  LINES/TUBES: ETT 9/8>>>  ASSESSMENT / PLAN:  Acute intracranial hemorrhage/thalamic hemorrhage with ventricular extension. Acute respiratory failure requiring mechanical ventilation for airway protection Hypertensive emergency Acute encephalopathy secondary to ICH Hypokalemia   Tachycardia, HTN  Plan worsening neurostatus expected, we should consider comfort care in line with prior discussions with no escalation Remain on current MV , would hold off weaning with neuro change, avoid any acidosis associated with poor weaning which could increase ICP BB for tachy, early herniation? 3% off, await chem for this am May need restart clev Re assess K this am  Feeding on hold for CT Repeat CT may be more convincing of poor outcome to family however Glu is in range, ssi limited needed ppi while on vent I updated family in full  Ccm time 30 min   Lavon Paganini. Titus Mould, MD, [place] Pgr: [facility]

## 2017-08-25 NOTE — Care Management Note (Signed)
Case Management Note  Patient Details  Name: Teresa Morales MRN: [id number] Date of Birth: 09-Nov-1941  Subjective/Objective:    Pt admitted on 08/27/2017 with RT thalamic ICH with IVH.  PTA, pt independent, lives with spouse.                  Action/Plan: Pt currently sedated and intubated.  Will follow for discharge planning as pt progresses.  Husband Teresa Morales and daughter at bedside.    Expected Discharge Date:                  Expected Discharge Plan:     In-House Referral:     Discharge planning Services  CM Consult  Post Acute Care Choice:    Choice offered to:     DME Arranged:    DME Agency:     HH Arranged:    HH Agency:     Status of Service:  In process, will continue to follow  If discussed at Long Length of Stay Meetings, dates discussed:    Additional Comments:  Reinaldo Raddle, RN, BSN  Trauma/Neuro ICU Case Manager [phone]

## 2017-08-25 NOTE — Progress Notes (Signed)
PT Cancellation Note  Patient Details Name: Teresa Morales MRN: [id number] DOB: 1941/04/02   Cancelled Treatment:    Reason Eval/Treat Not Completed: Patient not medically ready. Pt currently on bedrest. Will await increased activity orders prior to initiating PT eval.    Thelma Comp 08/25/2017, 9:17 AM  Rolinda Roan, PT, DPT Acute Rehabilitation Services Pager: [phone]

## 2017-08-25 NOTE — Progress Notes (Signed)
STROKE TEAM PROGRESS NOTE   SUBJECTIVE (INTERVAL HISTORY) Her husband and daughter are at the bedside.  Pt intubated, just off propofol, not open eyes or following commands. Right UE and LE movement seems less than yesterday, but could be due to sedation. CT repeat showed overall stable hematoma and hydrocephalus. Tachycardia, respond well with metoprolol.    OBJECTIVE Temp:  [97.5 F (36.4 C)-99.6 F (37.6 C)] 99 F (37.2 C) (09/11 1200) Pulse Rate:  [81-147] 92 (09/11 1500) Cardiac Rhythm: Sinus tachycardia (09/11 0800) Resp:  [12-25] 16 (09/11 1500) BP: (88-193)/(64-118) 124/72 (09/11 1500) SpO2:  [96 %-100 %] 98 % (09/11 1500) FiO2 (%):  [28 %] 28 % (09/11 1218) Weight:  [129 lb 6.6 oz (58.7 kg)] 129 lb 6.6 oz (58.7 kg) (09/11 0414)  CBC:   Recent Labs Lab 09/13/2017 2019  08/24/17 0337 08/25/17 0835  WBC 6.6  < > 10.6* 11.1*  NEUTROABS 5.0  --   --   --   HGB 12.6  < > 12.8 12.1  HCT 36.2  < > 37.6 36.0  MCV 90.5  < > 92.8 93.0  PLT 310  < > 312 294  < > = values in this interval not displayed.  Basic Metabolic Panel:   Recent Labs Lab 08/24/17 0150  08/24/17 1145 08/24/17 1633  08/25/17 0835 08/25/17 1212  NA 175*  < > 149*  148* 148*  < > 150* 150*  K 2.5*  < > 3.2*  --   --  2.7*  --   CL >130*  < > 116*  --   --  118*  --   CO2 21*  < > 25  --   --  23  --   GLUCOSE 128*  < > 142*  --   --  137*  --   BUN 11  < > 16  --   --  23*  --   CREATININE 0.34*  < > 0.40*  --   --  0.46  --   CALCIUM 7.1*  < > 8.9  --   --  9.0  --   MG 1.9  --   --  2.2  --   --   --   PHOS 1.7*  --   --  1.9*  --   --   --   < > = values in this interval not displayed.  Lipid Panel:     Component Value Date/Time   TRIG 64 09/03/2017 0058   HgbA1c:  Lab Results  Component Value Date   HGBA1C 5.4 08/24/2017   Urine Drug Screen: No results found for: LABOPIA, COCAINSCRNUR, LABBENZ, AMPHETMU, THCU, LABBARB  Alcohol Level No results found for: [place] I have  personally reviewed the radiological images below and agree with the radiology interpretations.  Ct Head Wo Contrast 08/21/2017 IMPRESSION:  Acute right thalamic hemorrhage with intraventricular extension. Trapping of the right temporal horn.   08/23/2017 IMPRESSION:  1. Evolving large RIGHT thalamic hematoma extending to midbrain with similar edema and mass effect including 8 mm RIGHT to LEFT midline shift.  2. Similar intraventricular extension of hematoma with stable moderate hydrocephalus.   08/23/2017 1. Evolving large RIGHT thalamic intraparenchymal hematoma extending to the midbrain with similar edema and mass effect.  2. Intraventricular extension of hemorrhage with worsening moderate hydrocephalus.   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   TTE -  Left ventricle: The cavity size was normal. Wall thickness was   normal. Systolic function was vigorous. The estimated ejection   fraction was in the range of 65% to 70%. Doppler parameters are   consistent with abnormal left ventricular relaxation (grade 1   diastolic dysfunction).  Ct Head Wo Contrast 08/25/2017 IMPRESSION: 1. Slightly decreased size of evolving right thalamic intraparenchymal hemorrhage with intraventricular extension. 2. Slightly worsened surrounding vasogenic edema with 9 mm of localized right-to-left midline shift. Slightly worsened hydrocephalus with evidence of transependymal flow of CSF.   PHYSICAL EXAM Vitals:   08/25/17 1218 08/25/17 1300 08/25/17 1400 08/25/17 1500  BP:  (!) 193/118 (!) 151/95 124/72  Pulse:  (!) 106 (!) 122 92  Resp:  (!) 23 17 16   Temp:      TempSrc:      SpO2: 96% 98% 100% 98%  Weight:      Height:        Temp:  [97.5 F (36.4 C)-99.6 F (37.6 C)] 99 F (37.2 C) (09/11 1200) Pulse Rate:  [81-147] 92 (09/11 1500) Resp:  [12-25] 16 (09/11 1500) BP: (88-193)/(64-118) 124/72  (09/11 1500) SpO2:  [96 %-100 %] 98 % (09/11 1500) FiO2 (%):  [28 %] 28 % (09/11 1218) Weight:  [129 lb 6.6 oz (58.7 kg)] 129 lb 6.6 oz (58.7 kg) (09/11 0414)  General - Well nourished, well developed, intubated.  Ophthalmologic - Fundi not visualized due to noncooperation.  Cardiovascular - Regular rate and rhythm.  Neuro - intubated, on sedation, on vent. Eyes not open to voice or pain, not following commands. On forced eye opening, eye middle position, 17mm bilateral pupil, sluggish to light, doll's eye present, weak corneals on the right, no corneal on the left, and positive but weak gag and cough. Not blinking to visual threat bilaterally, not tracking. RUE and RLE no spontaneous movement today. On pain stimulation, RUE and RLE withdraw 2/5, LUE extension and LLE mild withdraw. DTR 1+ and no babinski. Sensation, coordination and gait not tested.    ASSESSMENT/PLAN Ms. ANNICE JOLLY is a 76 y.o. female with history of hyponatremia, hypertension, and previous SIADH with baseline hyponatremia presenting with acute left hemiparesis.   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  Resultant  Intubated, left hemiparesis  CT head - Evolving large RIGHT thalamic hematoma extending to midbrain with similar edema and mass effect   CT repeat x 2 showed developing but stable moderate hydrocephalus  2D Echo  EF 65-70%  HgbA1c 5.4  VTE prophylaxis - SCDs Diet NPO time specified  No antithrombotic prior to admission, now on No antithrombotic.   Ongoing aggressive stroke risk factor management  Therapy recommendations:  pending  Disposition:  Pending  Hydrocephalus   CT repeat x 3 showed stable developing hydrocephalus  Family hesitant with any procedure as per pt wishes  Close monitoring  off 3% saline now   Na at goal 148-146-150   Repeat CT today showed largely unchanged hematoma and hydrocephalus  Hyponatremia at baseline    Na 124-128 at baseline  Off 3% saline now  On NS @ 40 and add free water 100cc Q4h  Avoid abrupt Na increase  Limit Na increase to < 12 in 24h period.  Na check Q4h  Na goal 145-150  Respiratory failure  Intubated on vent  CCM following  Hypertension  Stable  BP goal < 140  On cleviprex  On po norvasc and metoprolol 50mg  bid Long-term  BP goal normotensive  Tachycardia - likely due to agitation  Respond to metoprolol IV  On metoprolol 50mg  bid  Under control  Other Stroke Risk Factors  Advanced age  Other Active Problems  Hypokalemia - supplemented  Mild leukocytosis - 11.3->10.6->11.1  Potential pulmonary nodule - dedicated PA and lateral chest x-ray recommended for follow-up  Hospital day # 3  This patient is critically ill due to right thalamic large ICH with IVH, hydrocephalus, HTN, hyponatremia and at significant risk of neurological worsening, death form recurrent ICH, hematoma expansion, hydrocephalus, brain edema, cerebral herniation. This patient's care requires constant monitoring of vital signs, hemodynamics, respiratory and cardiac monitoring, review of multiple databases, neurological assessment, discussion with family, other specialists and medical decision making of high complexity. I spent 40 minutes of neurocritical care time in the care of this patient. I had long discussion with husband and daughter at bedside, updated pt current condition, treatment plan and potential prognosis. They expressed understanding and appreciation.   Rosalin Hawking, MD PhD Stroke Neurology 08/25/2017 3:10 PM    To contact Stroke Continuity provider, please refer to [email or web address] After hours, contact General Neurology

## 2017-08-26 LAB — GLUCOSE, CAPILLARY
GLUCOSE-CAPILLARY: 146 mg/dL — AB (ref 65–99)
GLUCOSE-CAPILLARY: 135 mg/dL — AB (ref 65–99)
GLUCOSE-CAPILLARY: 126 mg/dL — AB (ref 65–99)
GLUCOSE-CAPILLARY: 149 mg/dL — AB (ref 65–99)
Glucose-Capillary: 149 mg/dL — ABNORMAL HIGH (ref 65–99)
Glucose-Capillary: 133 mg/dL — ABNORMAL HIGH (ref 65–99)

## 2017-08-26 LAB — CBC
HCT: 35.7 % — ABNORMAL LOW (ref 36.0–46.0)
Hemoglobin: 12.1 g/dL (ref 12.0–15.0)
MCH: 32.2 pg (ref 26.0–34.0)
MCHC: 33.9 g/dL (ref 30.0–36.0)
MCV: 94.9 fL (ref 78.0–100.0)
PLATELETS: 242 10*3/uL (ref 150–400)
RBC: 3.76 MIL/uL — AB (ref 3.87–5.11)
RDW: 15.3 % (ref 11.5–15.5)
WBC: 11 10*3/uL — ABNORMAL HIGH (ref 4.0–10.5)

## 2017-08-26 LAB — BASIC METABOLIC PANEL
ANION GAP: 6 (ref 5–15)
BUN: 29 mg/dL — AB (ref 6–20)
CALCIUM: 8.8 mg/dL — AB (ref 8.9–10.3)
CO2: 23 mmol/L (ref 22–32)
Chloride: 124 mmol/L — ABNORMAL HIGH (ref 101–111)
Creatinine, Ser: 0.47 mg/dL (ref 0.44–1.00)
GFR calc Af Amer: >60 mL/min (ref >60)
GLUCOSE: 141 mg/dL — AB (ref 65–99)
Potassium: 3.6 mmol/L (ref 3.5–5.1)
SODIUM: 153 mmol/L — AB (ref 135–145)

## 2017-08-26 LAB — SODIUM
SODIUM: 154 mmol/L — AB (ref 135–145)
Sodium: 155 mmol/L — ABNORMAL HIGH (ref 135–145)
Sodium: 155 mmol/L — ABNORMAL HIGH (ref 135–145)

## 2017-08-26 MED ORDER — FREE WATER: 125.0000 mL | Status: DC

## 2017-08-26 MED ORDER — SODIUM CHLORIDE 0.45 % IV SOLN: INTRAVENOUS | Status: DC

## 2017-08-26 MED ORDER — METOPROLOL TARTRATE 5 MG/5ML IV SOLN
2.5000 mg | INTRAVENOUS | Status: DC | PRN
  Administered 2017-08-26 – 2017-08-27 (×3): 2.5 mg via INTRAVENOUS
  Filled 2017-08-26 (×3): qty 5

## 2017-08-26 NOTE — Progress Notes (Signed)
PT Cancellation Note  Patient Details Name: Teresa Morales MRN: [id number] DOB: [birth year]   Cancelled Treatment:    Reason Eval/Treat Not Completed: Patient not medically ready. Pt continues to be intubated/sedated/on bedrest. PT to sign off at this time 2 4 days of medical cancels. Please reorder when appropriate for PT evaluation. Thank you   Thelma Comp 08/26/2017, 7:28 AM   Rolinda Roan, PT, DPT Acute Rehabilitation Services Pager: [phone]

## 2017-08-26 NOTE — Progress Notes (Signed)
STROKE TEAM PROGRESS NOTE   SUBJECTIVE (INTERVAL HISTORY) No family is Initiallyat the bedside.  Pt intubated, no sedation, slightly open eyes on repetitive stimulation. However, not tracking, not blinking to visual threat. Right arm and leg less movement as prior. CT repeat  yesterday showed overall stable hematoma and hydrocephalus. Na 153  Has a long discussion with husband, daughter, and after 2 close friends of the patient. Updated pt current condition, treatment plan and potential poor prognosis. They expressed understanding and appreciation. They would like to have further family discussion. Offerwd palliative care consultation if needed.      OBJECTIVE Temp:  [99.7 F (37.6 C)-100.8 F (38.2 C)] 99.8 F (37.7 C) (09/12 2000) Pulse Rate:  [88-134] 132 (09/12 2130) Cardiac Rhythm: Sinus tachycardia (09/12 2000) Resp:  [16-27] 27 (09/12 2130) BP: (101-157)/(63-95) 134/81 (09/12 2130) SpO2:  [97 %-99 %] 97 % (09/12 2130) FiO2 (%):  [28 %] 28 % (09/12 2000) Weight:  [122 lb 5.7 oz (55.5 kg)] 122 lb 5.7 oz (55.5 kg) (09/12 0500)  CBC:   Recent Labs Lab 09/13/2017 2019  08/25/17 0835 08/26/17 0653  WBC 6.6  < > 11.1* 11.0*  NEUTROABS 5.0  --   --   --   HGB 12.6  < > 12.1 12.1  HCT 36.2  < > 36.0 35.7*  MCV 90.5  < > 93.0 94.9  PLT 310  < > 294 242  < > = values in this interval not displayed.  Basic Metabolic Panel:   Recent Labs Lab 08/24/17 0150  08/24/17 1633  08/25/17 0835  08/26/17 0653 08/26/17 1049 08/26/17 1448  NA 175*  < > 148*  < > 150*  < > 153* 154* 155*  K 2.5*  < >  --   --  2.7*  --  3.6  --   --   CL >130*  < >  --   --  118*  --  124*  --   --   CO2 21*  < >  --   --  23  --  23  --   --   GLUCOSE 128*  < >  --   --  137*  --  141*  --   --   BUN 11  < >  --   --  23*  --  29*  --   --   CREATININE 0.34*  < >  --   --  0.46  --  0.47  --   --   CALCIUM 7.1*  < >  --   --  9.0  --  8.8*  --   --   MG 1.9  --  2.2  --   --   --   --   --   --    PHOS 1.7*  --  1.9*  --   --   --   --   --   --   < > = values in this interval not displayed.  Lipid Panel:     Component Value Date/Time   TRIG 64 08/23/2017 0058   HgbA1c:  Lab Results  Component Value Date   HGBA1C 5.4 08/24/2017   Urine Drug Screen: No results found for: LABOPIA, COCAINSCRNUR, LABBENZ, AMPHETMU, THCU, LABBARB  Alcohol Level No results found for: [place] I have personally reviewed the radiological images below and agree with the radiology interpretations.  Ct Head Wo Contrast  IMPRESSION:  Acute right thalamic hemorrhage with  intraventricular extension. Trapping of the right temporal horn.   08/23/2017 IMPRESSION:  1. Evolving large RIGHT thalamic hematoma extending to midbrain with similar edema and mass effect including 8 mm RIGHT to LEFT midline shift.  2. Similar intraventricular extension of hematoma with stable moderate hydrocephalus.   08/23/2017 1. Evolving large RIGHT thalamic intraparenchymal hematoma extending to the midbrain with similar edema and mass effect.  2. Intraventricular extension of hemorrhage with worsening moderate hydrocephalus.   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   TTE - Left ventricle: The cavity size was normal. Wall thickness was   normal. Systolic function was vigorous. The estimated ejection   fraction was in the range of 65% to 70%. Doppler parameters are   consistent with abnormal left ventricular relaxation (grade 1   diastolic dysfunction).  Ct Head Wo Contrast 08/25/2017 IMPRESSION: 1. Slightly decreased size of evolving right thalamic intraparenchymal hemorrhage with intraventricular extension. 2. Slightly worsened surrounding vasogenic edema with 9 mm of localized right-to-left midline shift. Slightly worsened hydrocephalus with evidence of transependymal flow of CSF.   PHYSICAL  EXAM Vitals:   08/26/17 1919 08/26/17 2000 08/26/17 2030 08/26/17 2130  BP:  (!) 150/95 123/74 134/81  Pulse:  (!) 110 (!) 106 (!) 132  Resp:  (!) 21 16 (!) 27  Temp:  99.8 F (37.7 C)    TempSrc:  Axillary    SpO2: 97% 97% 97% 97%  Weight:      Height:        Temp:  [99.7 F (37.6 C)-100.8 F (38.2 C)] 99.8 F (37.7 C) (09/12 2000) Pulse Rate:  [88-134] 132 (09/12 2130) Resp:  [16-27] 27 (09/12 2130) BP: (101-157)/(63-95) 134/81 (09/12 2130) SpO2:  [97 %-99 %] 97 % (09/12 2130) FiO2 (%):  [28 %] 28 % (09/12 2000) Weight:  [122 lb 5.7 oz (55.5 kg)] 122 lb 5.7 oz (55.5 kg) (09/12 0500)  General - Well nourished, well developed, intubated.  Ophthalmologic - Fundi not visualized due to noncooperation.  Cardiovascular - Regular rate and rhythm.  Neuro - intubated,  not on sedation, pressure support on vent. Eyes slightly open on repetitive stimulation, not tracking, not blinking to visual threat, not following commands.eyes left gaze, doll's eye present, bilateral pupil, sluggish to light, weak corneals on the right, no corneal on the left, and positive cough. RUE and RLE no spontaneous movement. On pain stimulation, RUE and RLE withdraw 2/5, LUE extension and LLE mild withdraw. DTR 1+ and no babinski. Sensation, coordination and gait not tested.    ASSESSMENT/PLAN Ms. Teresa Morales is a 76 y.o. female with history of hyponatremia, hypertension, and previous SIADH with baseline hyponatremia presenting with acute left hemiparesis.   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  Resultant  Intubated, left hemiparesis  CT head - Evolving large RIGHT thalamic hematoma extending to midbrain with similar edema and mass effect   CT repeat x 2 showed developing but stable moderate hydrocephalus  2D Echo  EF 65-70%  HgbA1c 5.4  VTE prophylaxis - SCDs Diet NPO time specified  No antithrombotic prior to admission, now on No  antithrombotic.   Ongoing aggressive stroke risk factor management  Therapy recommendations:  pending  Disposition:  Pending  I had long discussion with husband and daughter and family friends, updated pt current condition, treatment plan and potential poor prognosis. They will have family discussion. Offered  palliative care consultation if needed   Hydrocephalus   CT repeat x 3 showed stable developing hydrocephalus  Family hesitant with any procedure as per pt wishes  Close monitoring  off 3% saline now   Na at goal 148-146-150-155  Repeat CT today showed largely unchanged hematoma and hydrocephalus  Hyponatremia at baseline   Na 124-128 at baseline  Off 3% saline now  Na check Q6h  Na goal 145-155  Respiratory failure  Intubated on vent  CCM following  Hypertension  Stable  BP goal < 160  On cleviprex  On po norvasc and metoprolol 50mg  bid Long-term BP goal normotensive  Other Stroke Risk Factors  Advanced age  Other Active Problems  Hypokalemia - supplemented  Mild leukocytosis - 11.3->10.6->11.1->11.0  Potential pulmonary nodule - dedicated PA and lateral chest x-ray recommended for follow-up  Hospital day # 4  This patient is critically ill due to right thalamic large ICH with IVH, hydrocephalus, HTN, hyponatremia and at significant risk of neurological worsening, death form recurrent ICH, hematoma expansion, hydrocephalus, brain edema, cerebral herniation. This patient's care requires constant monitoring of vital signs, hemodynamics, respiratory and cardiac monitoring, review of multiple databases, neurological assessment, discussion with family, other specialists and medical decision making of high complexity. I spent 50 minutes of neurocritical care time in the care of this patient. I had long discussion with husband and daughter and family friends, updated pt current condition, treatment plan and potential poor prognosis. They will have  family discussion. Offered palliative care consultation if needed    Rosalin Hawking, MD PhD Stroke Neurology 08/26/2017 9:58 PM    To contact Stroke Continuity provider, please refer to [email or web address] After hours, contact General Neurology

## 2017-08-26 NOTE — Progress Notes (Signed)
PULMONARY / CRITICAL CARE MEDICINE   Name: Teresa Morales MRN: [id number] DOB: Jun 18, 1941    ADMISSION DATE:   CONSULTATION DATE:  09/12/2017  REFERRING MD:  Leonel Ramsay  CHIEF COMPLAINT:  Left sided weakness   HISTORY OF PRESENT ILLNESS:   76 yr old female with recent diagnosis of HTN was well until 9/8 at 6:30pm when she allof a sudden fell in her room and hit her head. She was complaining of headache and then started having left sided weakness. Husband attended to her immediately and called 911. On arrival to the ED there was concern about her protecting her airway so she was intubated after calling code stroke. CT head showed right thalamic hemorrhage with ventricular extension.   Subjective/Overnight:  Cleviprex at 7mg /hr No sedation since 9/11 at 0800, RN thinks less movement of R side compared to yesterday Per RN, continued tachycardia 120-130's overnight AM labs pending  VITAL SIGNS: BP 102/65   Pulse (!) 117   Temp 99.7 F (37.6 C) (Axillary)   Resp 18   Ht 5\' 3"  (1.6 m)   Wt 122 lb 5.7 oz (55.5 kg)   LMP 12/15/1993 (Approximate)   SpO2 98%   BMI 21.67 kg/m   HEMODYNAMICS:    VENTILATOR SETTINGS: Vent Mode: PRVC FiO2 (%):  [28 %] 28 % Set Rate:  [12 bmp] 12 bmp Vt Set:  [420 mL] 420 mL PEEP:  [5 cmH20] 5 cmH20 Plateau Pressure:  [10 cmH20-13 cmH20] 12 cmH20  INTAKE / OUTPUT: I/O last 3 completed shifts: In: 2969 [I.V.:1869; NG/GT:1100] Out: 1750 [Urine:1750]  PHYSICAL EXAMINATION: General:  Elderly female lying in bed in NAD on MV HEENT: MM pink, ETT/ OGT, pupils 3/non-reactive, left downward gaze Neuro: eyes open in response to pain, flicker of response to pain on Right and LLE, extension to LUE, +cough CV: ST 120's, no murmur PULM: even/non-labored on MV, lungs bilaterally clear, breathing over vent GI: soft, bs active  Extremities: warm/dry, no edema  Skin: no rashes or lesions  LABS:  BMET  Recent Labs Lab 08/24/17 0337   08/24/17 1145  08/25/17 0835  08/25/17 1902 08/25/17 2227 08/26/17 0230  NA 145  < > 149*  148*  < > 150*  < > 151* 153* 155*  K 3.2*  --  3.2*  --  2.7*  --   --   --   --   CL 116*  --  116*  --  118*  --   --   --   --   CO2 19*  --  25  --  23  --   --   --   --   BUN 13  --  16  --  23*  --   --   --   --   CREATININE 0.40*  --  0.40*  --  0.46  --   --   --   --   GLUCOSE 143*  --  142*  --  137*  --   --   --   --   < > = values in this interval not displayed.  Electrolytes  Recent Labs Lab 08/23/17 1819 08/24/17 0150 08/24/17 0337 08/24/17 1145 08/24/17 1633 08/25/17 0835  CALCIUM  --  7.1* 8.6* 8.9  --  9.0  MG 2.1 1.9  --   --  2.2  --   PHOS 1.7* 1.7*  --   --  1.9*  --     CBC  Recent Labs  Lab 08/24/17 0150 08/24/17 0337 08/25/17 0835  WBC 9.3 10.6* 11.1*  HGB 10.8* 12.8 12.1  HCT 31.9* 37.6 36.0  PLT 268 312 294    Coag's  Recent Labs Lab 08/20/2017 2019  APTT 28  INR 0.93    Sepsis Markers No results for input(s): LATICACIDVEN, PROCALCITON, O2SATVEN in the last 168 hours.  ABG  Recent Labs Lab 09/09/2017 2141 08/25/17 1048  PHART 7.436 7.465*  PCO2ART 43.4 42.5  PO2ART 495.0* 395.0*    Liver Enzymes  Recent Labs Lab 08/27/2017 2019  AST 30  ALT 28  ALKPHOS 64  BILITOT 0.4  ALBUMIN 3.8    Cardiac Enzymes No results for input(s): TROPONINI, PROBNP in the last 168 hours.  Glucose  Recent Labs Lab 08/25/17 0739 08/25/17 1154 08/25/17 1549 08/25/17 1958 08/25/17 2332 08/26/17 0319  GLUCAP 141* 138* 138* 140* 147* 149*    Imaging Ct Head Wo Contrast  Result Date: 08/25/2017 CLINICAL DATA:  Follow-up examination for intra cerebral hemorrhage. EXAM: CT HEAD WITHOUT CONTRAST TECHNIQUE: Contiguous axial images were obtained from the base of the skull through the vertex without intravenous contrast. COMPARISON:  Prior CT from 08/23/2017. FINDINGS: Brain: Evolving intraparenchymal hemorrhage centered at the right thalamus  slightly decreased in size now measuring 3.5 x 2.3 x 3.3 cm (estimated volume 13.2 mL). Surrounding vasogenic edema has worsened with slightly increased localized right-to-left midline shift, now measuring 9 mm, previously 8 mm. Intraventricular extension with blood again seen in the lateral, third, and fourth ventricles. Associated ventricular dilatation has increased, consistent with hydrocephalus. Increase periventricular hypodensity consistent with transependymal flow of CSF. No other new acute intracranial hemorrhage. No acute large vessel territory infarct. No mass lesion. No extra-axial fluid collection. Vascular: No hyperdense vessel. Scattered vascular calcifications noted within the carotid siphons. Skull: Scalp soft tissues and calvarium within normal limits. Sinuses/Orbits: Globes and orbital soft tissues within normal limits. Paranasal sinuses and mastoid air cells are clear. Other: None. IMPRESSION: 1. Slightly decreased size of evolving right thalamic intraparenchymal hemorrhage with intraventricular extension. 2. Slightly worsened surrounding vasogenic edema with 9 mm of localized right-to-left midline shift. Slightly worsened hydrocephalus with evidence of transependymal flow of CSF. Electronically Signed   By: Jeannine Boga M.D.   On: 08/25/2017 14:27     STUDIES:  CT head 9/8 >> bright thalamic ICH with ventricular extension  TTE 9/10>> LVEF 65-75% vigorous, G1DD CT head 9/11 >> Slightly decreased size of evolving right thalamic intraparenchymal hemorrhage with intraventricular extension. Slightly worsened surrounding vasogenic edema with 9 mm of localized right-to-left midline shift.  Slightly worsened hydrocephalus with evidence of transependymal flow of CSF.  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 Acute respiratory failure requiring  mechanical ventilation for airway protection Hypertensive emergency Acute encephalopathy secondary to [place] Chronic hyponatremia/ previous SIADH Hypokalemia  Tachycardia, HTN - repeat head CT 9/11 showed largely unchanged hematoma and hydrocephalus Plan Consider transitioning to comfort care in line with prior discussions with no escalation Remain on current MV , hold off weaning with poor neuro exam, avoid any acidosis associated with poor weaning which could increase ICP AM CBC pending Goal Na 145-150, NS at 40 ml/h, increase free water 125 ml q 4hr,  Continue Na q 4 hr Wean cleviprex for goal SBP < 140 Continue norvasc and metoprolol Replace electrolytes as indicated Continue SSI sensitive ppi while on vent   CC time 30 min  Updates:  Husband at bedside updated, pending neuro rounds.  He is aware of patient's poor prognosis.  He states he needs more time.  Out-of town family arrived last night.  Verbalizes patient and the family would not continue aggressive care if she was not to improve.   Kennieth Rad, AGACNP-BC [facility] Pgr: [phone] or if no answer [phone] 08/26/2017, 8:06 AM    STAFF NOTE: I, Merrie Roof, MD FACP have personally reviewed patient's available data, including medical history, events of note, physical examination and test results as part of my evaluation. I have discussed with resident/NP and other care providers such as pharmacist, RN and RRT. In addition, I personally evaluated patient and elicited key findings of: not awake, NOT fc, not moving rt ext as prior, no ronchi, CTA anterior, abdo soft, min edema, CT repeat I reviewed shows about same size bleed, slight increase ventr and worsening shift, her neuro status is worse, keep MV same on vent, weaning SBT ps / cpap goal 2 hours, not an extubation candidate, allow NA 155, no role free water , we tolerate this level of NA on labs this am, I feel her CT and clinical status are  worse, we should consider dc life support given her prior wishes and her lack of progress, feeding to maintain, I updated family in full The patient is critically ill with multiple organ systems failure and requires high complexity decision making for assessment and support, frequent evaluation and titration of therapies, application of advanced monitoring technologies and extensive interpretation of multiple databases.   Critical Care Time devoted to patient care services described in this note is 35 Minutes. This time reflects time of care of this signee: Merrie Roof, MD FACP. This critical care time does not reflect procedure time, or teaching time or supervisory time of PA/NP/Med student/Med Resident etc but could involve care discussion time. Rest per NP/medical resident whose note is outlined above and that I agree with   Lavon Paganini. Titus Mould, MD, [place] Pgr: [facility] 08/26/2017 9:51 AM

## 2017-08-26 NOTE — Progress Notes (Signed)
OT Cancellation Note  Patient Details Name: TAREA SKILLMAN MRN: [id number] DOB: 1941-01-09   Cancelled Treatment:    Reason Eval/Treat Not Completed: Patient not medically ready (intubated / sedated / bedrest) Ot to sign off at this time due to 4 days of medical cancels. Please reorder when appropriate for OT evaluation  Peri Maris  [phone] 08/26/2017, 7:27 AM

## 2017-08-26 NOTE — Progress Notes (Signed)
Orthopedic Tech Progress Note Patient Details:  Teresa Morales Dec 21, 1940 [id number]  Ortho Devices Type of Ortho Device: Postop shoe/boot Ortho Device/Splint Location: (B) LE prafo boots Ortho Device/Splint Interventions: Ordered, Application   Braulio Bosch 08/26/2017, 3:07 PM

## 2017-08-27 LAB — CBC
HCT: 35.4 % — ABNORMAL LOW (ref 36.0–46.0)
HEMOGLOBIN: 11.6 g/dL — AB (ref 12.0–15.0)
MCH: 31.4 pg (ref 26.0–34.0)
MCHC: 32.8 g/dL (ref 30.0–36.0)
MCV: 95.9 fL (ref 78.0–100.0)
PLATELETS: 227 10*3/uL (ref 150–400)
RBC: 3.69 MIL/uL — AB (ref 3.87–5.11)
RDW: 15.2 % (ref 11.5–15.5)
WBC: 10.1 10*3/uL (ref 4.0–10.5)

## 2017-08-27 LAB — GLUCOSE, CAPILLARY
GLUCOSE-CAPILLARY: 137 mg/dL — AB (ref 65–99)
GLUCOSE-CAPILLARY: 156 mg/dL — AB (ref 65–99)
GLUCOSE-CAPILLARY: 147 mg/dL — AB (ref 65–99)
Glucose-Capillary: 121 mg/dL — ABNORMAL HIGH (ref 65–99)
Glucose-Capillary: 130 mg/dL — ABNORMAL HIGH (ref 65–99)
Glucose-Capillary: 131 mg/dL — ABNORMAL HIGH (ref 65–99)

## 2017-08-27 LAB — URINALYSIS, MICROSCOPIC (REFLEX)

## 2017-08-27 LAB — BASIC METABOLIC PANEL
ANION GAP: 5 (ref 5–15)
BUN: 38 mg/dL — ABNORMAL HIGH (ref 6–20)
CHLORIDE: 125 mmol/L — AB (ref 101–111)
CO2: 27 mmol/L (ref 22–32)
Calcium: 8.9 mg/dL (ref 8.9–10.3)
Creatinine, Ser: 0.5 mg/dL (ref 0.44–1.00)
GFR calc Af Amer: >60 mL/min (ref >60)
Glucose, Bld: 138 mg/dL — ABNORMAL HIGH (ref 65–99)
POTASSIUM: 3.1 mmol/L — AB (ref 3.5–5.1)
SODIUM: 157 mmol/L — AB (ref 135–145)

## 2017-08-27 LAB — URINALYSIS, ROUTINE W REFLEX MICROSCOPIC
BILIRUBIN URINE: NEGATIVE
GLUCOSE, UA: NEGATIVE mg/dL
KETONES UR: NEGATIVE mg/dL
Nitrite: NEGATIVE
PH: 6 (ref 5.0–8.0)
PROTEIN: 30 mg/dL — AB
Specific Gravity, Urine: 1.01 (ref 1.005–1.030)

## 2017-08-27 LAB — SODIUM
SODIUM: 155 mmol/L — AB (ref 135–145)
SODIUM: 160 mmol/L — AB (ref 135–145)
Sodium: 159 mmol/L — ABNORMAL HIGH (ref 135–145)

## 2017-08-27 MED ORDER — METOPROLOL TARTRATE 50 MG PO TABS: 50.0000 mg | ORAL_TABLET | Freq: Two times a day (BID) | ORAL | Status: DC

## 2017-08-27 MED ORDER — METOPROLOL TARTRATE 50 MG PO TABS
75.0000 mg | ORAL_TABLET | Freq: Two times a day (BID) | ORAL | Status: DC
  Administered 2017-08-27: 75 mg via ORAL
  Filled 2017-08-27: qty 1

## 2017-08-27 MED ORDER — SODIUM CHLORIDE 0.9 % IV BOLUS (SEPSIS): 500.0000 mL | Freq: Once | INTRAVENOUS | Status: DC

## 2017-08-27 MED ORDER — METOPROLOL TARTRATE 50 MG PO TABS
75.0000 mg | ORAL_TABLET | Freq: Two times a day (BID) | ORAL | Status: DC
  Administered 2017-08-27: 23:00:00 75 mg via ORAL
  Filled 2017-08-27: qty 1

## 2017-08-27 MED ORDER — FREE WATER
125.0000 mL | Status: DC
  Administered 2017-08-27 (×3): 125 mL

## 2017-08-27 MED ORDER — POTASSIUM CHLORIDE CRYS ER 20 MEQ PO TBCR
40.0000 meq | EXTENDED_RELEASE_TABLET | Freq: Once | ORAL | Status: AC
  Administered 2017-08-27: 40 meq via ORAL
  Filled 2017-08-27: qty 2

## 2017-08-27 MED ORDER — FREE WATER
250.0000 mL | Status: DC
  Administered 2017-08-27 – 2017-08-28 (×3): 250 mL

## 2017-08-27 MED ORDER — IBUPROFEN 100 MG/5ML PO SUSP
600.0000 mg | Freq: Three times a day (TID) | ORAL | Status: DC | PRN
  Administered 2017-08-27: 600 mg
  Filled 2017-08-27: qty 30

## 2017-08-27 MED ORDER — VITAL AF 1.2 CAL PO LIQD
1000.0000 mL | ORAL | Status: DC
  Administered 2017-08-27: 1000 mL

## 2017-08-27 NOTE — Progress Notes (Signed)
Palliative Medicine RN Note: Due to high consult volume and pending weather concerns [facility] Bartolo Darter), there will be a delay in seeing this patient (possibly several days). Should you need immediate recommendations, please call our office between the hours of 0700-1900.   Marjie Skiff Ibtisam Benge, RN, BSN, [facility] 08/27/2017 1:44 PM Cell [phone] 8:00-4:00 Monday-Friday Office [phone]

## 2017-08-27 NOTE — Progress Notes (Signed)
Brief palliative note:  I met today with Ms. Nembhard's family.  We discussed care plan moving forward, and family seems realistic with expectations.  We discussed what transition to full comfort and liberation from vent would entail.  Family to discuss this evening.  Will f/u tomorrow AM.  Full consult note to follow.  Micheline Rough, MD [place] Team [phone]

## 2017-08-27 NOTE — Progress Notes (Signed)
Nutrition Follow-up  INTERVENTION:   Vital AF 1.2 @ 50 ml/hr  Provides: 1440 kcal, 90 grams protein, and 973 ml free water Total free water: 1723 ml  125 ml free water every 4 hours   NUTRITION DIAGNOSIS:   Inadequate oral intake related to inability to eat as evidenced by NPO status. Ongoing.   GOAL:   Patient will meet greater than or equal to 90% of their needs Not met.   MONITOR:   TF tolerance, Vent status  ASSESSMENT:   Pt with recent dx of HTN who was admitted after falling and hitting her head with right thalamic hemorrhage with ventricular extension.  Remains intubated Now off cleviprex and propofol Spoke with RN, possible withdrawal early next week.   Labs/meds reviewed  TF: Vital High Protein to 25 ml/hr (600 ml/day), 30 ml Prostat BID Provides: 800 kcal, 82 grams protein, and 501 ml free water.   Diet Order:  Diet NPO time specified  Skin:  Reviewed, no issues  Last BM:  unknown  Height:   Ht Readings from Last 1 Encounters:  09/10/2017 _0  (1.6 m)    Weight:   Wt Readings from Last 1 Encounters:  08/27/17 122 lb 9.2 oz (55.6 kg)    Ideal Body Weight:  52.2 kg  BMI:  Body mass index is 21.71 kg/m.  Estimated Nutritional Needs:   Kcal:  3838  Protein:  75-90 grams  Fluid:  > 1.5 L/day  EDUCATION NEEDS:   No education needs identified at this time  [place], [place], [place] Pager [phone] After Hours Pager

## 2017-08-27 NOTE — Progress Notes (Signed)
Consultation Note Date: 08/27/2017   Patient Name: Teresa Morales  DOB: [birth year]  MRN: [id number]  Age / Sex: 76 y.o., female  PCP: Josetta Huddle, MD Referring Physician: Rosalin Hawking, MD  Reason for Consultation: Establishing goals of care  HPI/Patient Profile: 76 y.o. female  with past medical history of HTN admitted on 08/15/2017 with Large thalamic hemorrhage.  She remains unresponsive on vent. Palliative consulted for goals of care   Clinical Assessment and Goals of Care: I met today with patient's husband, daughter, sister, and brother.  I introduced palliative care as specialized medical care for people living with serious illness. It focuses on providing relief from the symptoms and stress of a serious illness. The goal is to improve quality of life for both the patient and the family.  We discussed clinical course as well as wishes moving forward in regard to advanced directives as patient had discussed with her family in the past.  We discussed difference between a aggressive medical intervention path and a palliative, comfort focused care path.  Values and goals of care important to patient and family were attempted to be elicited.  Concept of Hospice and Palliative Care were discussed  Questions and concerns addressed.   PMT will continue to support holistically   SUMMARY OF RECOMMENDATIONS   - Family is in agreement that she would not desire to continue with life support based upon her current clinical situation and prognosis moving forward. - We discussed what transition to full comfort and liberation from vent would likely entail. -Family is going to take the opportunity to discuss this evening and we'll meet again tomorrow to continue conversation.  Code Status/Advance Care Planning:  DNR  Palliative Prophylaxis:   Frequent Pain Assessment  Psycho-social/Spiritual:   Desire for  further Chaplaincy support: Already involved  Additional Recommendations: Education on Hospice  Prognosis:   Hours - Days if liberated from vent  Discharge Planning: Anticipated Hospital Death      Primary Diagnoses: Present on Admission: . ICH (intracerebral hemorrhage) ([place]) . Acute respiratory failure ([place]) . Hypertensive emergency   I have reviewed the medical record, interviewed the patient and family, and examined the patient. The following aspects are pertinent.  Past Medical History:  Diagnosis Date  . Anemia    history of borderline anemia  . Arthritis    left thumb and lower back,  . Atypical glandular cells of undetermined significance (AGUS) on cervical Pap smear 12/1998   endo polyp- [name]  . Disc degeneration, lumbar 2016   L4, L5, S1  . Endocervical polyp 1997  . Environmental allergies   . GERD (gastroesophageal reflux disease)   . Headache    Sinusitis  . Hypertension   . Hyponatremia   . Interstitial cystitis 2004   mild  . Melanoma of face ([place]) 04/2007  . Mild dysplasia of cervix    laser  . MRSA infection    many yrs ago -right hand(tx. with oral antibiotic)-no further issues  . Osteopenia    intolerant of  bisphosphonates  . 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 Urethral stenosis    dilated  . Varicose vein of leg    Social History   Social History  . Marital status: Married    Spouse name: N/A  . Number of children: N/A  . Years of education: N/A   Social History Main Topics  . Smoking status: Never Smoker  . Smokeless tobacco: Never Used  . Alcohol use No  . Drug use: No  . Sexual activity: Yes    Partners: Male    Birth control/ protection: Post-menopausal     Comment: husband vasectomy   Other Topics Concern  . None   Social History Narrative  . None   Family History  Problem Relation Age of Onset  . Other Mother 34       MVA   . Lupus Sister 14  .  Hypertension Father   . Osteoporosis Sister   . Thyroid disease Sister   . Scoliosis Sister   . Prostate cancer Brother   . Allergic rhinitis Neg Hx   . Angioedema Neg Hx   . Asthma Neg Hx   . Eczema Neg Hx   . Immunodeficiency Neg Hx   . Urticaria Neg Hx    Scheduled Meds: . amLODipine  10 mg Oral Daily  . free water  250 mL Per Tube Q4H  . insulin aspart  1-3 Units Subcutaneous Q4H  . mouth rinse  15 mL Mouth Rinse 10 times per day  . metoprolol tartrate  75 mg Oral BID  . pantoprazole (PROTONIX) IV  40 mg Intravenous QHS  . senna-docusate  1 tablet Oral BID   Continuous Infusions: . clevidipine Stopped (08/27/17 1114)  . feeding supplement (VITAL AF 1.2 CAL) 1,000 mL (08/27/17 1807)   PRN Meds:.ibuprofen Medications Prior to Admission:  Prior to Admission medications   Medication Sig Start Date End Date Taking? Authorizing Provider  azelastine (ASTELIN) 0.1 % nasal spray 2 sprays per nostril twice for runny nose. Patient taking differently: Place 1-2 sprays into both nostrils See admin instructions. Instill 1-2 sprays into each nare once or twice daily for runny nose. 06/25/17  Yes Valentina Shaggy, MD  Calcium Citrate-Vitamin D 200-250 MG-UNIT TABS Take 2 tablets by mouth 2 (two) times daily.   Yes [provider]  cholecalciferol (VITAMIN D) 1000 UNITS tablet Take 1,000 Units by mouth daily.   Yes [provider]  EPINEPHrine (EPIPEN 2-PAK) 0.3 mg/0.3 mL IJ SOAJ injection Inject 0.3 mLs (0.3 mg total) into the muscle once. Patient taking differently: Inject 0.3 mg into the muscle once as needed (severe allergic reaction).  06/26/16  Yes Bardelas, Jose A, MD  estradiol (ESTRACE) 0.1 MG/GM vaginal cream USE 0.5 GRAMS VAGINALLY 2 TO 3 TIMES EVERY WEEK 07/09/17  Yes Nunzio Cobbs, MD  Eszopiclone (ESZOPICLONE) 3 MG TABS Take 3 mg by mouth at bedtime as needed (for sleep). Take immediately before bedtime ([place])   Yes [provider]    fluticasone (FLONASE) 50 MCG/ACT nasal spray Place 1 spray into both nostrils See admin instructions. Instill 1 spray into each nare once or twice daily   Yes [provider]  levocetirizine (XYZAL) 5 MG tablet Take 5 mg by mouth daily. 08/06/17  Yes [provider]  loratadine (CLARITIN) 10 MG tablet Take 10 mg by mouth daily.   Yes [provider]  [place] (OSTEO  BI-FLEX JOINT SHIELD PO) Take 2 tablets by mouth daily.    Yes [provider]  Misc Natural Products (TART CHERRY ADVANCED) CAPS Take 2 capsules by mouth 2 (two) times daily.   Yes [provider]  montelukast (SINGULAIR) 10 MG tablet TAKE 1 TABLET BY MOUTH EVERY EVENING TO PREVENT COUGH OR WHEEZE Patient taking differently: TAKE 1 TABLET (10 MG) BY MOUTH EVERY EVENING TO PREVENT COUGH OR WHEEZE 05/12/17  Yes Valentina Shaggy, MD  Multiple Vitamin (MULTIVITAMIN WITH MINERALS) TABS tablet Take 1 tablet by mouth daily.   Yes [provider]  olmesartan (BENICAR) 20 MG tablet Take 20 mg by mouth daily. 07/10/17  Yes [provider]  Omega-3 Fatty Acids (FISH OIL) 1000 MG CAPS Take 1,000 mg by mouth daily.   Yes [provider]  omeprazole (PRILOSEC) 20 MG capsule Take 20 mg by mouth 2 (two) times daily.    Yes [provider]  Probiotic CAPS Take 2 capsules by mouth daily.   Yes [provider]  triamcinolone cream (KENALOG) 0.1 % Apply 1 application topically 2 (two) times daily as needed (for rash).  03/22/14  Yes [provider]  triamterene-hydrochlorothiazide (MAXZIDE-25) 37.5-25 MG per tablet Take 0.5 tablets by mouth daily.   Yes [provider]  trolamine salicylate (ASPERCREME) 10 % cream Apply 1 application topically at bedtime as needed for muscle pain.    Yes [provider]  valsartan (DIOVAN) 160 MG tablet Take 160 mg by mouth every evening.   Yes [provider]  ibuprofen (ADVIL,MOTRIN)  800 MG tablet Take 1 tablet (800 mg total) by mouth every 8 (eight) hours as needed for cramping. Patient not taking: Reported on 07/03/2017 06/29/17   Nunzio Cobbs, MD  lidocaine (XYLOCAINE) 5 % ointment Apply 1 application topically 4 (four) times daily as needed. Patient not taking: Reported on 07/03/2017 04/23/17   Salvadore Dom, MD  metroNIDAZOLE (METROGEL) 0.75 % vaginal gel Place 1 Applicatorful vaginally at bedtime. X 5 nights Patient not taking: Reported on 08/15/2017 07/09/17   Nunzio Cobbs, MD   Allergies  Allergen Reactions  . Bee Venom Swelling    Reaction to yellow jackets  . Macrodantin [Nitrofurantoin] Hives and Shortness Of Breath  . Chlorhexidine Itching    Redness  . Darvocet [Propoxyphene N-Acetaminophen] Hives and Itching    Tolerates acetaminophen  . Endocet [Oxycodone-Acetaminophen] Hives and Itching    Tolerates acetaminophen  . Floxin [Ofloxacin] Itching  . Latex Itching  . Penetrex [Enoxacin] Itching  . Sulfa Antibiotics Other (See Comments)    Unknown - childhood allergy   . Hibiclens [Chlorhexidine Gluconate] Rash    Local reaction with previous surgery.    Review of Systems Unable to obtain  Physical Exam General: Intubated, unresponsive, in no acute distress.  Heart: Tachycardic Lungs: On vent, course Abdomen: Soft, nontender, nondistended, positive bowel sounds.  Ext: No significant edema Skin: Warm and dry  Vital Signs: BP 106/82   Pulse (!) 114   Temp 99.4 F (37.4 C) (Axillary)   Resp 16   Ht _0  (1.6 m)   Wt 55.6 kg (122 lb 9.2 oz)   LMP 12/15/1993 (Approximate)   SpO2 97%   BMI 21.71 kg/m  Pain Assessment: CPOT     SpO2: SpO2: 97 % O2 Device:SpO2: 97 % O2 Flow Rate: .   IO: Intake/output summary:  Intake/Output Summary (Last 24 hours) at 08/27/17 2227 Last data filed at 08/27/17  1807  Gross per 24 hour  Intake           256.77 ml  Output             1175 ml  Net          -918.23 ml     LBM: Last BM Date:  (pta) Baseline Weight: Weight: 57.8 kg (127 lb 6.8 oz) Most recent weight: Weight: 55.6 kg (122 lb 9.2 oz)     Palliative Assessment/Data:     Time In: 1800 Time Out: 1920 Time Total: 80 Greater than 50%  of this time was spent counseling and coordinating care related to the above assessment and plan.  Signed by: Micheline Rough, MD   Please contact Palliative Medicine Team phone at [phone] for questions and concerns.  For individual provider: See Shea Evans

## 2017-08-27 NOTE — Progress Notes (Signed)
BPs soft (cleviprex has been off since 1115). NP Brooke notified. Orders received.

## 2017-08-27 NOTE — Progress Notes (Signed)
PULMONARY / CRITICAL CARE MEDICINE   Name: Teresa Morales MRN: [id number] DOB: Nov 07, 1941    ADMISSION DATE:  08/31/2017 CONSULTATION DATE:  08/23/2017  REFERRING MD:  Leonel Ramsay  CHIEF COMPLAINT:  Left sided weakness   HISTORY OF PRESENT ILLNESS:   76 yr old female with recent diagnosis of HTN was well until 9/8 at 6:30pm when she allof a sudden fell in her room and hit her head. She was complaining of headache and then started having left sided weakness. Husband attended to her immediately and called 911. On arrival to the ED there was concern about her protecting her airway so she was intubated after calling code stroke. CT head showed right thalamic hemorrhage with ventricular extension.   Subjective/Overnight:  Cleviprex at 3 mg/hr Tmax 101.1 overnight  VITAL SIGNS: BP 117/70   Pulse (!) 105   Temp (!) 101.2 F (38.4 C) (Axillary)   Resp 17   Ht 5\' 3"  (1.6 m)   Wt 122 lb 9.2 oz (55.6 kg)   LMP 12/15/1993 (Approximate)   SpO2 98%   BMI 21.71 kg/m   HEMODYNAMICS:    VENTILATOR SETTINGS: Vent Mode: PRVC FiO2 (%):  [28 %] 28 % Set Rate:  [12 bmp] 12 bmp Vt Set:  [420 mL] 420 mL PEEP:  [5 cmH20] 5 cmH20 Pressure Support:  [8 cmH20] 8 cmH20 Plateau Pressure:  [12 cmH20-13 cmH20] 13 cmH20  INTAKE / OUTPUT: I/O last 3 completed shifts: In: 2061.9 [I.V.:911.9; NG/GT:1150] Out: 2025 [Urine:2025]  PHYSICAL EXAMINATION: General:  Elderly female lying in bed in NAD on MV HEENT: MM pink, ETT/ OGT, pupils 3/non-reactive, discongugate gaze Neuro: eyes open in response to repeated pain, flicker of response to pain to BLE, LUE extension, no response in RUE, +cough CV: ST 120's, no murmur PULM: even/non-labored on MV, lungs bilaterally clear, breathing over vent GI: soft, bs active  Extremities: warm/dry, trace edema  Skin: no rashes, right hand with warmth and erythema   LABS:  BMET  Recent Labs Lab 08/24/17 1145  08/25/17 0835  08/26/17 0653 08/26/17 1049  08/26/17 1448 08/27/17 0101  NA 149*  148*  < > 150*  < > 153* 154* 155* 155*  K 3.2*  --  2.7*  --  3.6  --   --   --   CL 116*  --  118*  --  124*  --   --   --   CO2 25  --  23  --  23  --   --   --   BUN 16  --  23*  --  29*  --   --   --   CREATININE 0.40*  --  0.46  --  0.47  --   --   --   GLUCOSE 142*  --  137*  --  141*  --   --   --   < > = values in this interval not displayed.  Electrolytes  Recent Labs Lab 08/23/17 1819 08/24/17 0150  08/24/17 1145 08/24/17 1633 08/25/17 0835 08/26/17 0653  CALCIUM  --  7.1*  < > 8.9  --  9.0 8.8*  MG 2.1 1.9  --   --  2.2  --   --   PHOS 1.7* 1.7*  --   --  1.9*  --   --   < > = values in this interval not displayed.  CBC  Recent Labs Lab 08/25/17 0835 08/26/17 0653 08/27/17 0600  WBC 11.1* 11.0*  10.1  HGB 12.1 12.1 11.6*  HCT 36.0 35.7* 35.4*  PLT 294 242 227    Coag's  Recent Labs Lab 08/19/2017 2019  APTT 28  INR 0.93    Sepsis Markers No results for input(s): LATICACIDVEN, PROCALCITON, O2SATVEN in the last 168 hours.  ABG  Recent Labs Lab 09/11/2017 2141 08/25/17 1048  PHART 7.436 7.465*  PCO2ART 43.4 42.5  PO2ART 495.0* 395.0*    Liver Enzymes  Recent Labs Lab 09/03/2017 2019  AST 30  ALT 28  ALKPHOS 64  BILITOT 0.4  ALBUMIN 3.8    Cardiac Enzymes No results for input(s): TROPONINI, PROBNP in the last 168 hours.  Glucose  Recent Labs Lab 08/26/17 1141 08/26/17 1527 08/26/17 1923 08/26/17 2317 08/27/17 0313 08/27/17 0723  GLUCAP 135* 126* 133* 149* 137* 156*    Imaging No results found.   STUDIES:  CT head 9/8 >> bright thalamic ICH with ventricular extension  TTE 9/10>> LVEF 65-75% vigorous, G1DD CT head 9/11 >> Slightly decreased size of evolving right thalamic intraparenchymal hemorrhage with intraventricular extension. Slightly worsened surrounding vasogenic edema with 9 mm of localized right-to-left midline shift.  Slightly worsened hydrocephalus with evidence of  transependymal flow of CSF.  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 pain Fever - no obvious source, ? Inflammation in right hand, remains with no change in WBC, secretions/vent requirements, lungs clear HTN - repeat head CT 9/11 showed largely unchanged hematoma and hydrocephalus - no sedation since 9/11 at [place] with husband and family today, may need PMT consult  Continue PRVC, with weaning goal 2 hours, but not an extubation candidate  Wean cleviprex for goal SBP < 140 Increase metoprolol to 75mg  to help achieve BP goals Continue norvasc  Motrin PRN (allergy to tylenol) for fever, may help with tachycardia AM BMET pending Replace electrolytes as indicated Continue SSI sensitive PPI for SUP   CC time 32 min   Updates:  No family at the bedside presently.  Husband and family updated yesterday by PCCM and Neurology and expressed they need time to have a family discussion to decide GOCs.  Palliation consult offered by Neurology.  Will need to re-address with family today, as patient has not had any improvement and poor prognosis remains.     Kennieth Rad, AGACNP-BC [facility] Pgr: [phone] or if no answer [phone] 08/27/2017, 7:34 AM   STAFF NOTE: I, Merrie Roof, MD FACP have personally reviewed patient's available data, including medical history, events of note, physical examination and test results as part of my evaluation. I have discussed with resident/NP and other care providers such as pharmacist, RN and RRT. In addition, I personally evaluated patient  and elicited key findings of: not awake, less response all together, Per 75mm, bruising left eye, clear lungs, abdo soft, edema min, fever noted, no new pcxr, fever likley related to neuro insult as worsening hydro and shifts day prior, no indication ABX, assess UA, weaning ps  cpap 5/5 well, no indication PNA, she is worse neuro wise and needs comfort care, updated family on status and that she suffering, they are having more family visit, pall care to talk to them per primary, Na noted, treat some free  water, chem in am The patient is critically ill with multiple organ systems failure and requires high complexity decision making for assessment and support, frequent evaluation and titration of therapies, application of advanced monitoring technologies and extensive interpretation of multiple databases.   Critical Care Time devoted to patient care services described in this note is 30 Minutes. This time reflects time of care of this signee: Merrie Roof, MD FACP. This critical care time does not reflect procedure time, or teaching time or supervisory time of PA/NP/Med student/Med Resident etc but could involve care discussion time. Rest per NP/medical resident whose note is outlined above and that I agree with   Lavon Paganini. Titus Mould, MD, [place] Pgr: [facility] 08/27/2017 10:15 AM

## 2017-08-27 NOTE — Progress Notes (Signed)
STROKE TEAM PROGRESS NOTE   SUBJECTIVE (INTERVAL HISTORY) Daughter and husband are at the bedside. Pt neuro no significant change, on vent, slightly open eyes on repetitive stimulation, but not tracking not blinking to visual threat. Right UE and LE move less than yesterday. Family not deciding further step, agree with palliative care consult.    OBJECTIVE Temp:  [99.4 F (37.4 C)-101.2 F (38.4 C)] 99.4 F (37.4 C) (09/13 2000) Pulse Rate:  [86-132] 114 (09/13 1800) Cardiac Rhythm: Sinus tachycardia (09/13 0400) Resp:  [16-27] 24 (09/13 1800) BP: (89-153)/(56-90) 139/90 (09/13 1800) SpO2:  [97 %-99 %] 99 % (09/13 1918) FiO2 (%):  [28 %] 28 % (09/13 1918) Weight:  [122 lb 9.2 oz (55.6 kg)] 122 lb 9.2 oz (55.6 kg) (09/13 0402)  CBC:   Recent Labs Lab 08/16/2017 2019  08/26/17 0653 08/27/17 0600  WBC 6.6  < > 11.0* 10.1  NEUTROABS 5.0  --   --   --   HGB 12.6  < > 12.1 11.6*  HCT 36.2  < > 35.7* 35.4*  MCV 90.5  < > 94.9 95.9  PLT 310  < > 242 227  < > = values in this interval not displayed.  Basic Metabolic Panel:   Recent Labs Lab 08/24/17 0150  08/24/17 1633  08/26/17 0653  08/27/17 0600 08/27/17 1241 08/27/17 1924  NA 175*  < > 148*  < > 153*  < > 157* 159* 160*  K 2.5*  < >  --   < > 3.6  --  3.1*  --   --   CL >130*  < >  --   < > 124*  --  125*  --   --   CO2 21*  < >  --   < > 23  --  27  --   --   GLUCOSE 128*  < >  --   < > 141*  --  138*  --   --   BUN 11  < >  --   < > 29*  --  38*  --   --   CREATININE 0.34*  < >  --   < > 0.47  --  0.50  --   --   CALCIUM 7.1*  < >  --   < > 8.8*  --  8.9  --   --   MG 1.9  --  2.2  --   --   --   --   --   --   PHOS 1.7*  --  1.9*  --   --   --   --   --   --   < > = values in this interval not displayed.  Lipid Panel:     Component Value Date/Time   TRIG 64 09/08/2017 0058   HgbA1c:  Lab Results  Component Value Date   HGBA1C 5.4 08/24/2017   Urine Drug Screen: No results found for: LABOPIA, COCAINSCRNUR,  LABBENZ, AMPHETMU, THCU, LABBARB  Alcohol Level No results found for: [place] I have personally reviewed the radiological images below and agree with the radiology interpretations.  Ct Head Wo Contrast 09/04/2017 IMPRESSION:  Acute right thalamic hemorrhage with intraventricular extension. Trapping of the right temporal horn.   08/23/2017 IMPRESSION:  1. Evolving large RIGHT thalamic hematoma extending to midbrain with similar edema and mass effect including 8 mm RIGHT to LEFT midline shift.  2. Similar intraventricular extension of hematoma with stable moderate  hydrocephalus.   08/23/2017 1. Evolving large RIGHT thalamic intraparenchymal hematoma extending to the midbrain with similar edema and mass effect.  2. Intraventricular extension of hemorrhage with worsening moderate hydrocephalus.   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   TTE - Left ventricle: The cavity size was normal. Wall thickness was   normal. Systolic function was vigorous. The estimated ejection   fraction was in the range of 65% to 70%. Doppler parameters are   consistent with abnormal left ventricular relaxation (grade 1   diastolic dysfunction).  Ct Head Wo Contrast 08/25/2017 IMPRESSION: 1. Slightly decreased size of evolving right thalamic intraparenchymal hemorrhage with intraventricular extension. 2. Slightly worsened surrounding vasogenic edema with 9 mm of localized right-to-left midline shift. Slightly worsened hydrocephalus with evidence of transependymal flow of CSF.   PHYSICAL EXAM Vitals:   08/27/17 1700 08/27/17 1800 08/27/17 1918 08/27/17 2000  BP: 95/63 139/90    Pulse: 99 (!) 114    Resp: 19 (!) 24    Temp:    99.4 F (37.4 C)  TempSrc:    Axillary  SpO2: 97% 99% 99%   Weight:      Height:        Temp:  [99.4 F (37.4 C)-101.2 F (38.4 C)] 99.4 F (37.4 C) (09/13 2000) Pulse  Rate:  [86-132] 114 (09/13 1800) Resp:  [16-27] 24 (09/13 1800) BP: (89-153)/(56-90) 139/90 (09/13 1800) SpO2:  [97 %-99 %] 99 % (09/13 1918) FiO2 (%):  [28 %] 28 % (09/13 1918) Weight:  [122 lb 9.2 oz (55.6 kg)] 122 lb 9.2 oz (55.6 kg) (09/13 0402)  General - Well nourished, well developed, intubated.  Ophthalmologic - Fundi not visualized due to noncooperation.  Cardiovascular - Regular rate and rhythm.  Neuro - intubated,  not on sedation, on vent. Eyes slightly open on repetitive stimulation, not tracking, not blinking to visual threat, not following commands.eyes left gaze, doll's eye present, bilateral pupil, sluggish to light, weak corneals on the right, no corneal on the left, and positive cough. RUE and RLE no spontaneous movement. On pain stimulation, RUE slight extension and RLE withdraw 2/5, LUE extension and LLE mild withdraw. DTR 1+ and no babinski. Sensation, coordination and gait not tested.    ASSESSMENT/PLAN Teresa Morales is a 76 y.o. female with history of hyponatremia, hypertension, and previous SIADH with baseline hyponatremia presenting with acute left hemiparesis.   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  Resultant  Intubated, left hemiparesis  CT head - Evolving large RIGHT thalamic hematoma extending to midbrain with similar edema and mass effect   CT repeat x 2 showed developing but stable moderate hydrocephalus  2D Echo  EF 65-70%  HgbA1c 5.4  VTE prophylaxis - SCDs Diet NPO time specified  No antithrombotic prior to admission, now on No antithrombotic.   Ongoing aggressive stroke risk factor management  Therapy recommendations:  pending  Disposition:  Pending  Family not deciding, agree with palliative care consult  Hydrocephalus   CT repeat x 3 showed stable developing hydrocephalus  Family hesitant with any procedure as per pt wishes  Close monitoring  off 3% saline now   Na  at goal 148-146-150-155->157  Repeat CT today showed largely unchanged hematoma and hydrocephalus  Hyponatremia at baseline   Na 124-128 at baseline  Off 3% saline now  Na check Q6h  Na goal 145-155  Respiratory failure  Intubated on vent  CCM following  Hypertension  Stable  BP goal < 160  On cleviprex  On po norvasc and metoprolol 50mg  bid Long-term BP goal normotensive  Other Stroke Risk Factors  Advanced age  Other Active Problems  Hypokalemia - supplemented  Mild leukocytosis - 11.3->10.6->11.1->11.0->10.1  Potential pulmonary nodule - dedicated PA and lateral chest x-ray recommended for follow-up  Hospital day # 5  This patient is critically ill due to right thalamic large ICH with IVH, hydrocephalus, HTN, hyponatremia and at significant risk of neurological worsening, death form recurrent ICH, hematoma expansion, hydrocephalus, brain edema, cerebral herniation. This patient's care requires constant monitoring of vital signs, hemodynamics, respiratory and cardiac monitoring, review of multiple databases, neurological assessment, discussion with family, other specialists and medical decision making of high complexity. I spent 35 minutes of neurocritical care time in the care of this patient.   Teresa Hawking, Teresa Morales Stroke Neurology 08/27/2017 9:05 PM    To contact Stroke Continuity provider, please refer to [email or web address] After hours, contact General Neurology

## 2017-08-28 DIAGNOSIS — Z7189 Other specified counseling: Secondary | ICD-10-CM

## 2017-08-28 DIAGNOSIS — Z515 Encounter for palliative care: Secondary | ICD-10-CM

## 2017-08-28 LAB — CBC
HEMATOCRIT: 36.2 % (ref 36.0–46.0)
HEMOGLOBIN: 11.8 g/dL — AB (ref 12.0–15.0)
MCH: 31.4 pg (ref 26.0–34.0)
MCHC: 32.6 g/dL (ref 30.0–36.0)
MCV: 96.3 fL (ref 78.0–100.0)
Platelets: 209 10*3/uL (ref 150–400)
RBC: 3.76 MIL/uL — ABNORMAL LOW (ref 3.87–5.11)
RDW: 15.4 % (ref 11.5–15.5)
WBC: 12.4 10*3/uL — AB (ref 4.0–10.5)

## 2017-08-28 LAB — BASIC METABOLIC PANEL
ANION GAP: 6 (ref 5–15)
BUN: 43 mg/dL — ABNORMAL HIGH (ref 6–20)
CHLORIDE: 127 mmol/L — AB (ref 101–111)
CO2: 23 mmol/L (ref 22–32)
Calcium: 8.7 mg/dL — ABNORMAL LOW (ref 8.9–10.3)
Creatinine, Ser: 0.48 mg/dL (ref 0.44–1.00)
GFR calc non Af Amer: >60 mL/min (ref >60)
GLUCOSE: 117 mg/dL — AB (ref 65–99)
Potassium: 4.1 mmol/L (ref 3.5–5.1)
Sodium: 156 mmol/L — ABNORMAL HIGH (ref 135–145)

## 2017-08-28 LAB — GLUCOSE, CAPILLARY
GLUCOSE-CAPILLARY: 137 mg/dL — AB (ref 65–99)
Glucose-Capillary: 157 mg/dL — ABNORMAL HIGH (ref 65–99)

## 2017-08-28 LAB — SODIUM: Sodium: 157 mmol/L — ABNORMAL HIGH (ref 135–145)

## 2017-08-28 MED ORDER — DEXAMETHASONE SODIUM PHOSPHATE 10 MG/ML IJ SOLN
10.0000 mg | Freq: Once | INTRAMUSCULAR | Status: AC
  Administered 2017-08-28: 10 mg via INTRAVENOUS

## 2017-08-28 MED ORDER — POLYVINYL ALCOHOL 1.4 % OP SOLN
1.0000 [drp] | Freq: Four times a day (QID) | OPHTHALMIC | Status: DC | PRN
  Filled 2017-08-28: qty 15

## 2017-08-28 MED ORDER — LORAZEPAM 1 MG PO TABS: 1.0000 mg | ORAL_TABLET | ORAL | Status: DC | PRN

## 2017-08-28 MED ORDER — GLYCOPYRROLATE 0.2 MG/ML IJ SOLN
0.2000 mg | INTRAMUSCULAR | Status: DC | PRN
  Administered 2017-08-28: 0.4 mg via INTRAVENOUS
  Filled 2017-08-28 (×2): qty 1

## 2017-08-28 MED ORDER — ONDANSETRON 4 MG PO TBDP: 4.0000 mg | ORAL_TABLET | Freq: Four times a day (QID) | ORAL | Status: DC | PRN

## 2017-08-28 MED ORDER — HALOPERIDOL LACTATE 5 MG/ML IJ SOLN: 1.0000 mg | INTRAMUSCULAR | Status: DC | PRN

## 2017-08-28 MED ORDER — GLYCOPYRROLATE 1 MG PO TABS: 1.0000 mg | ORAL_TABLET | ORAL | Status: DC | PRN

## 2017-08-28 MED ORDER — MORPHINE BOLUS VIA INFUSION
2.0000 mg | INTRAVENOUS | Status: DC | PRN
  Filled 2017-08-28: qty 5

## 2017-08-28 MED ORDER — LORAZEPAM 2 MG/ML IJ SOLN
1.0000 mg | INTRAMUSCULAR | Status: DC | PRN
Start: 2017-08-28 — End: 2017-08-28
  Administered 2017-08-28: 1 mg via INTRAVENOUS
  Filled 2017-08-28: qty 1

## 2017-08-28 MED ORDER — HALOPERIDOL LACTATE 2 MG/ML PO CONC
0.5000 mg | ORAL | Status: DC | PRN
  Filled 2017-08-28: qty 0.3

## 2017-08-28 MED ORDER — HALOPERIDOL 0.5 MG PO TABS
0.5000 mg | ORAL_TABLET | ORAL | Status: DC | PRN
  Filled 2017-08-28: qty 1

## 2017-08-28 MED ORDER — ACETAMINOPHEN 325 MG PO TABS: 650.0000 mg | ORAL_TABLET | Freq: Four times a day (QID) | ORAL | Status: DC | PRN

## 2017-08-28 MED ORDER — ONDANSETRON HCL 4 MG/2ML IJ SOLN: 4.0000 mg | Freq: Four times a day (QID) | INTRAMUSCULAR | Status: DC | PRN

## 2017-08-28 MED ORDER — GLYCOPYRROLATE 0.2 MG/ML IJ SOLN: 0.2000 mg | INTRAMUSCULAR | Status: DC | PRN

## 2017-08-28 MED ORDER — BIOTENE DRY MOUTH MT LIQD: 15.0000 mL | OROMUCOSAL | Status: DC | PRN

## 2017-08-28 MED ORDER — DEXAMETHASONE SODIUM PHOSPHATE 10 MG/ML IJ SOLN
INTRAMUSCULAR | Status: AC
  Filled 2017-08-28: qty 1

## 2017-08-28 MED ORDER — SODIUM CHLORIDE 0.9 % IV SOLN
1.0000 mg/h | INTRAVENOUS | Status: DC
  Administered 2017-08-28: 1 mg/h via INTRAVENOUS
  Filled 2017-08-28: qty 10

## 2017-08-28 MED ORDER — MORPHINE BOLUS VIA INFUSION: 2.0000 mg | INTRAVENOUS | Status: DC | PRN

## 2017-08-28 MED ORDER — SODIUM CHLORIDE 0.9 % IV SOLN: 1.0000 mg/h | INTRAVENOUS | Status: DC

## 2017-08-28 MED ORDER — LORAZEPAM 2 MG/ML PO CONC
1.0000 mg | ORAL | Status: DC | PRN
Start: 2017-08-28 — End: 2017-08-28

## 2017-08-28 MED ORDER — ACETAMINOPHEN 650 MG RE SUPP: 650.0000 mg | Freq: Four times a day (QID) | RECTAL | Status: DC | PRN

## 2017-09-02 ENCOUNTER — Telehealth: Payer: Self-pay

## 2017-09-02 NOTE — Telephone Encounter (Signed)
Rn spoke with Dr. Erlinda Hong about death certificate needing to be completed.Dr. Erlinda Hong stated he fill  the form out at the hospital and it was given to the funeral home.  Rn call  [employer] at [phone]. Rn spoke with Elmyra Ricks to explain another death certificate was sent to [place]. Elmyra Ricks stated they did receive the death certificate from the hospital last week. Rn notified it was receive. No need to complete the one sent to [facility].

## 2017-09-03 IMAGING — RF DG ESOPHAGUS
18 of 22 series · 19 of 24 positions shown · non-contrast
Comparison: None.

CLINICAL DATA: Dysphagia

EXAM:
ESOPHOGRAM / BARIUM SWALLOW / BARIUM TABLET STUDY
TECHNIQUE: Combined double contrast and single contrast examination performed
using effervescent crystals, thick barium liquid, and thin barium
liquid. The patient was observed with fluoroscopy swallowing a 13 mm
barium sulphate tablet.
FLUOROSCOPY TIME:  Radiation Exposure Index (as provided by the
fluoroscopic device): 22 deciGy per square cm
If the device does not provide the exposure index:
Fluoroscopy Time:  1 minutes 42 second
Number of Acquired Images:

[Series 5: run · 1 of 1 slices shown (1 of 18)]
[im 1/1]
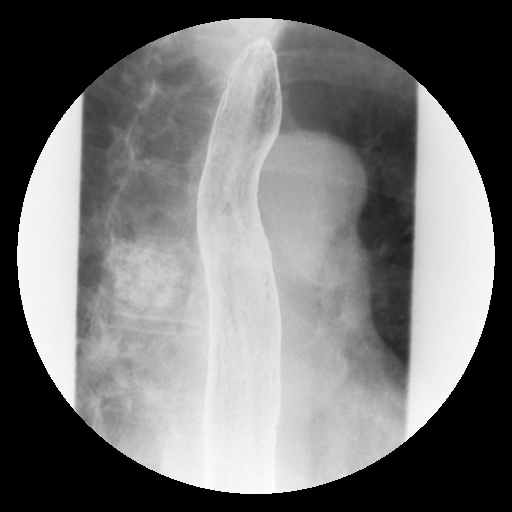

[Series 7: run · 1 of 1 slices shown (2 of 18)]
[im 1/1]
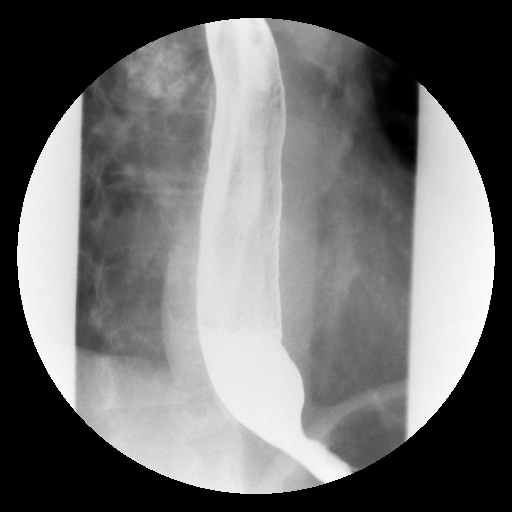

[Series 9: run · 1 of 1 slices shown (3 of 18)]
[im 1/1]
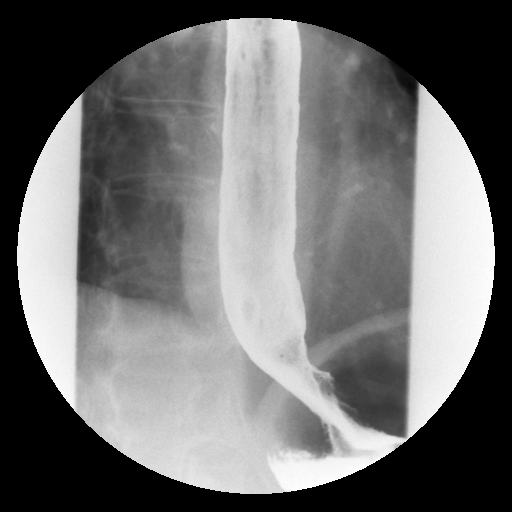

[Series 10: run · 1 of 1 slices shown (4 of 18)]
[im 1/1]
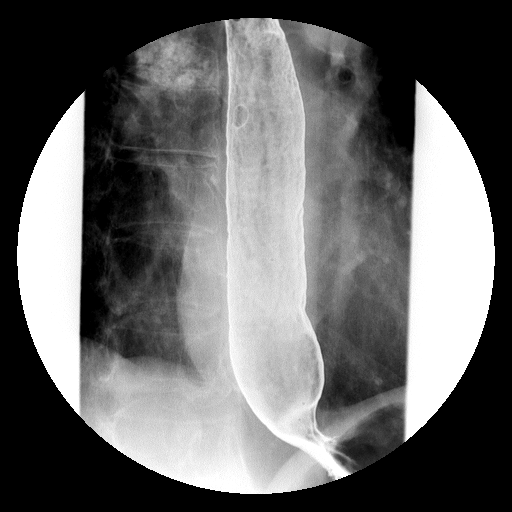

[Series 11: run · 1 of 1 slices shown (5 of 18)]
[im 1/1]
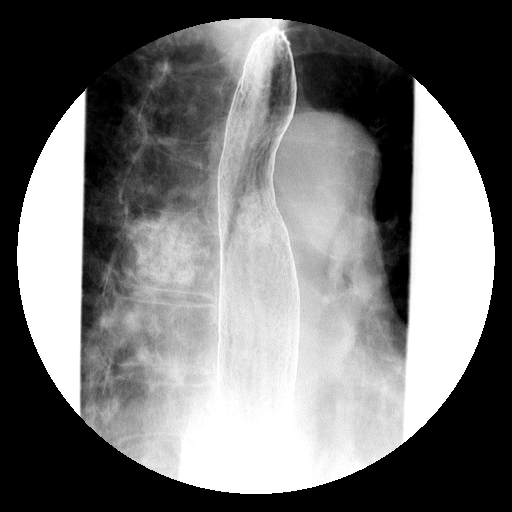

[Series 12: run · 1 of 9 slices shown (6 of 18)]
[im 1/9]
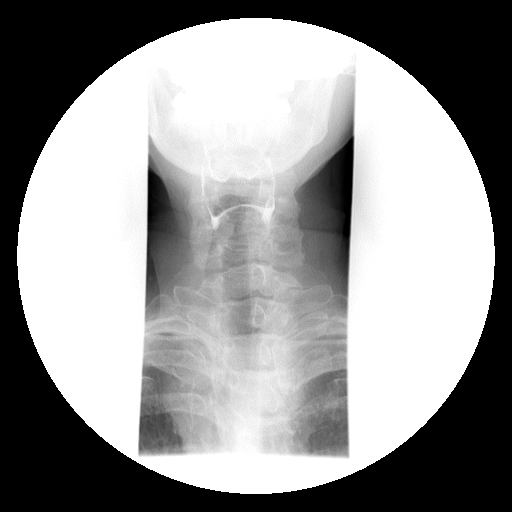

[Series 13: run · 2 of 8 slices shown (7 of 18)]
[im 1/8]
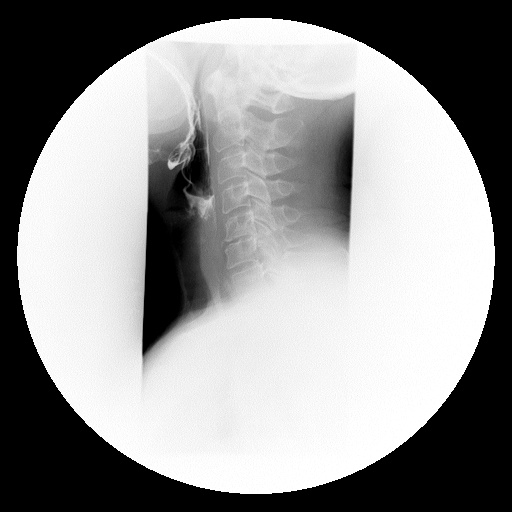
[im 8/8]
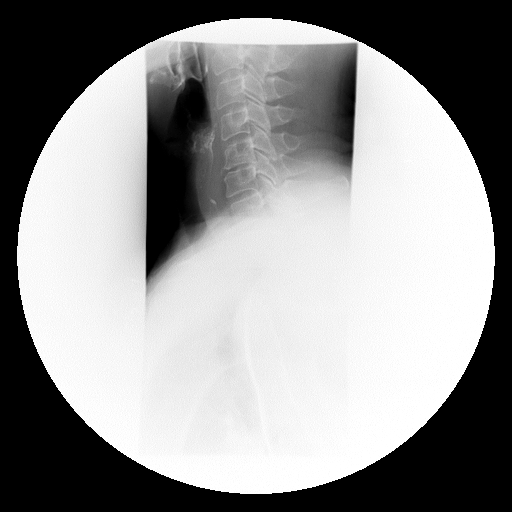

[Series 14: run · 1 of 1 slices shown (8 of 18)]
[im 1/1]
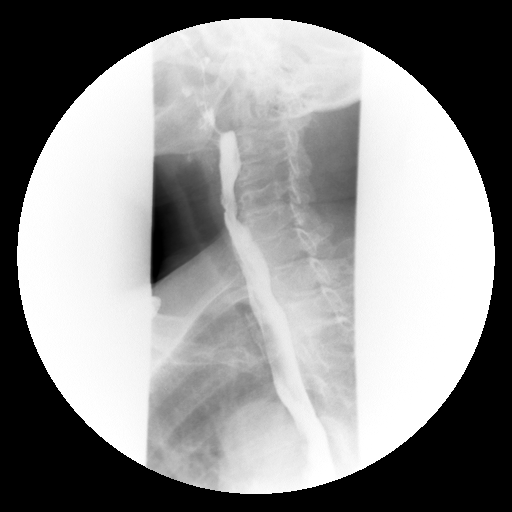

[Series 16: run · 1 of 1 slices shown (9 of 18)]
[im 1/1]
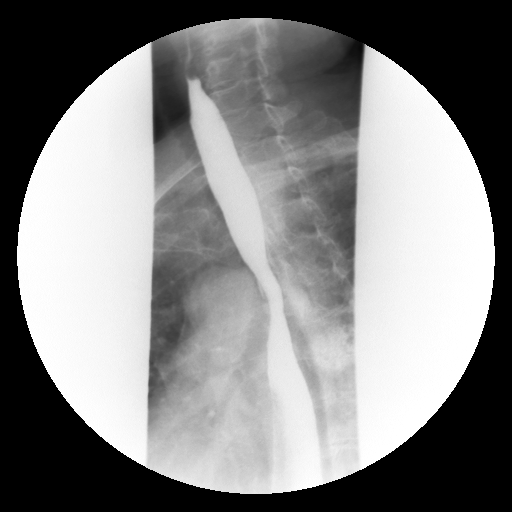

[Series 17: run · 1 of 1 slices shown (10 of 18)]
[im 1/1]
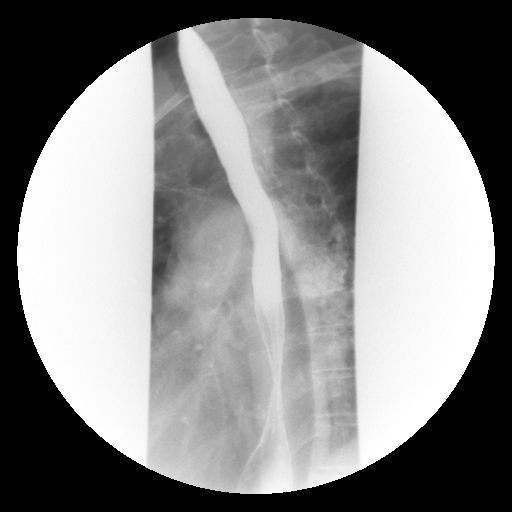

[Series 18: run · 1 of 1 slices shown (11 of 18)]
[im 1/1]
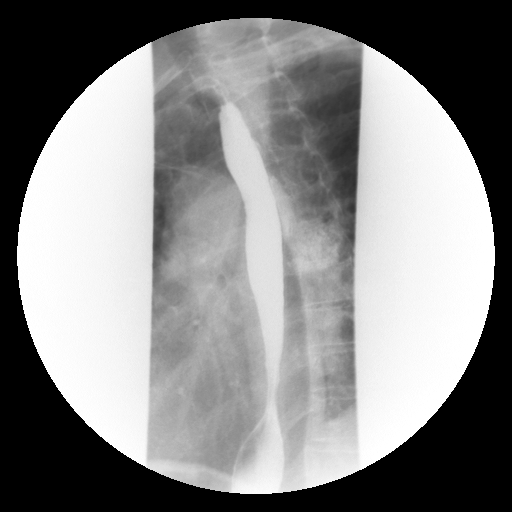

[Series 19: run · 1 of 1 slices shown (12 of 18)]
[im 1/1]
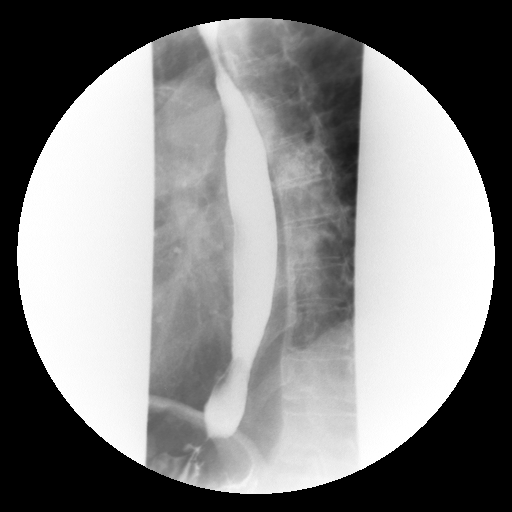

[Series 21: run · 1 of 1 slices shown (13 of 18)]
[im 1/1]
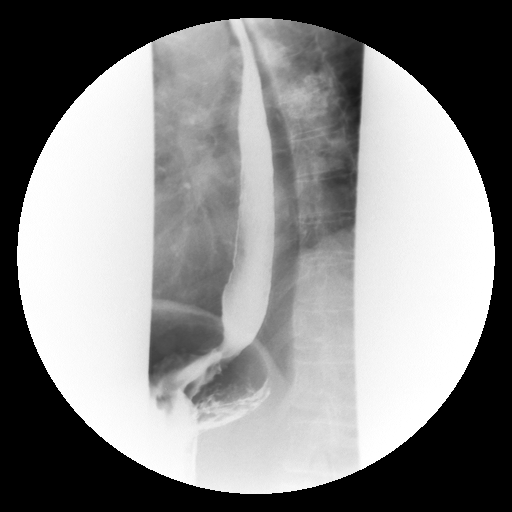

[Series 22: run · 1 of 1 slices shown (14 of 18)]
[im 1/1]
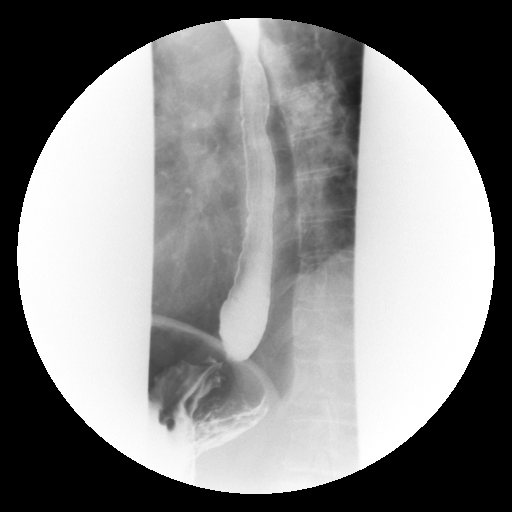

[Series 23: run · 1 of 1 slices shown (15 of 18)]
[im 1/1]
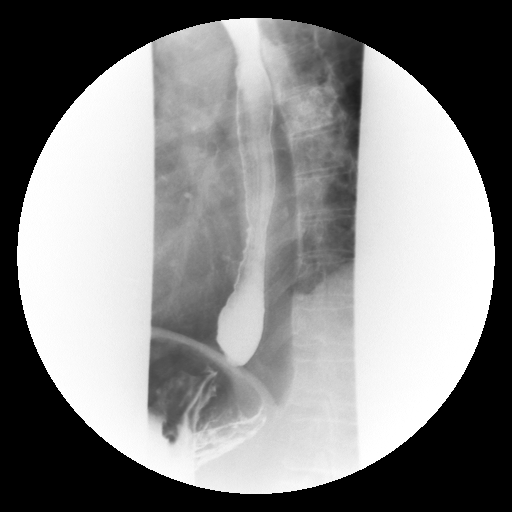

[Series 24: run · 1 of 1 slices shown (16 of 18)]
[im 1/1]
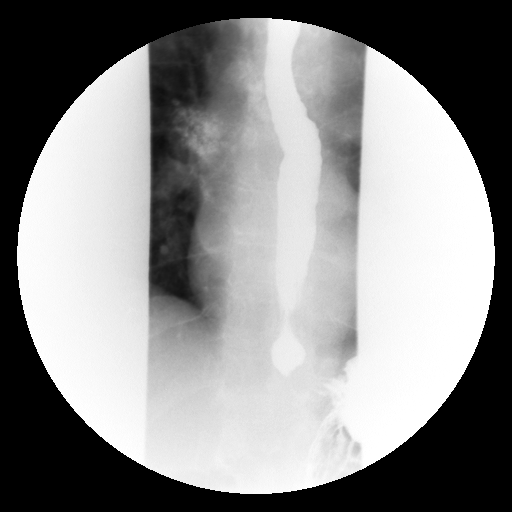

[Series 26: run · 1 of 1 slices shown (17 of 18)]
[im 1/1]
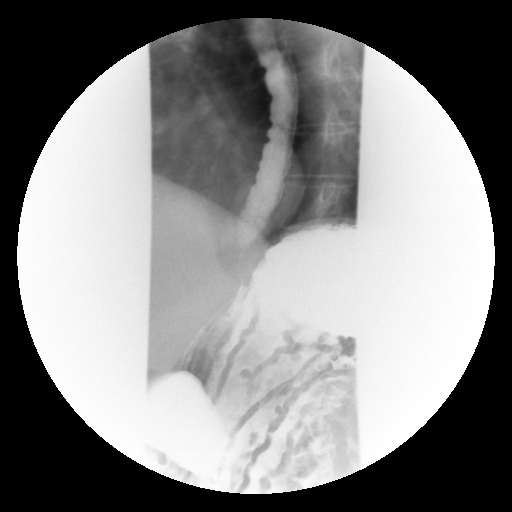

[Series 27: run · 1 of 1 slices shown (18 of 18)]
[im 1/1]
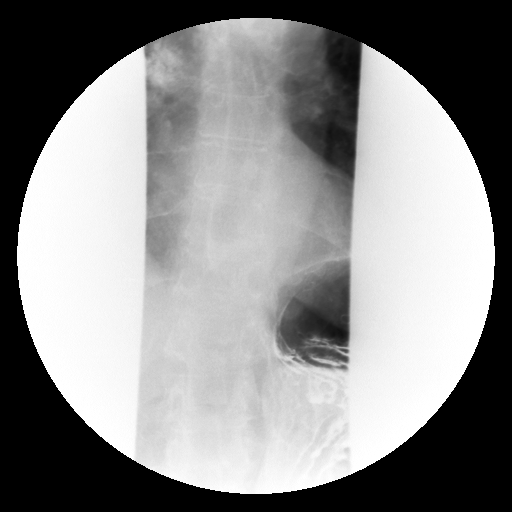

[19 of 24 positions shown; findings below may reference images not displayed]

FINDINGS: A double-contrast barium swallow was performed. The mucosa of the
esophagus is unremarkable. Rapid sequence spot films of the cervical
esophagus show no significant abnormality. No aspiration or
penetration is seen. There are moderate tertiary contractions in the
mid and distal esophagus. No hiatal hernia is seen. Only faint
gastroesophageal reflux is demonstrated. A barium pill was given at
the end of the study which passed into the stomach without delay.
IMPRESSION: 1. Moderate tertiary contractions in the mid and distal esophagus.
2. Very faint gastroesophageal reflux is elicited with the water
siphon technique. The barium pill passes into the stomach without
delay.

## 2017-09-14 NOTE — Progress Notes (Signed)
Daily Progress Note   Patient Name: Teresa Morales       Date: Sep 06, 2017 DOB: 04-Apr-1941  Age: 76 y.o. MRN#: [id number] Attending Physician: Rosalin Hawking, MD Primary Care Physician: Josetta Huddle, MD Admit Date: 08/21/2017  Reason for Consultation/Follow-up: Establishing goals of care, Terminal Care and Withdrawal of life-sustaining treatment  Subjective: I met this morning with Teresa Morales's family including her husband, daughter, brother, and sister.   We reviewed our discussion from last night including her clinical course this admission. I reviewed CT scans with family per their request.  We met to discuss goals as well as care plan moving forward. See below:  Length of Stay: 6  Current Medications: Scheduled Meds:  . amLODipine  10 mg Oral Daily  . free water  250 mL Per Tube Q4H  . insulin aspart  1-3 Units Subcutaneous Q4H  . mouth rinse  15 mL Mouth Rinse 10 times per day  . metoprolol tartrate  75 mg Oral BID  . pantoprazole (PROTONIX) IV  40 mg Intravenous QHS  . senna-docusate  1 tablet Oral BID    Continuous Infusions: . clevidipine Stopped (08/27/17 1114)  . feeding supplement (VITAL AF 1.2 CAL) 1,000 mL (08/27/17 1807)  . morphine      PRN Meds: acetaminophen **OR** acetaminophen, antiseptic oral rinse, glycopyrrolate **OR** glycopyrrolate **OR** glycopyrrolate, haloperidol **OR** haloperidol **OR** haloperidol lactate, ibuprofen, LORazepam **OR** LORazepam **OR** LORazepam, morphine, ondansetron **OR** ondansetron (ZOFRAN) IV, polyvinyl alcohol  Physical Exam    General: Unresponsive on vent.  Heart: Regular rate and rhythm. No murmur appreciated. Lungs: Coarse Abdomen: Soft, nontender, nondistended, positive bowel sounds.  Ext: No significant edema Skin:  Warm and dry  Vital Signs: BP (!) 155/89   Pulse (!) 106   Temp 99.7 F (37.6 C) (Axillary)   Resp (!) 21   Ht _0  (1.6 m)   Wt 54.6 kg (120 lb 5.9 oz)   LMP 12/15/1993 (Approximate)   SpO2 99%   BMI 21.32 kg/m  SpO2: SpO2: 99 % O2 Device: O2 Device: Ventilator O2 Flow Rate:    Intake/output summary:  Intake/Output Summary (Last 24 hours) at September 06, 2017 1033 Last data filed at 2017/09/06 0600  Gross per 24 hour  Intake              750 ml  Output             1075 ml  Net             -325 ml   LBM: Last BM Date:  (pta) Baseline Weight: Weight: 57.8 kg (127 lb 6.8 oz) Most recent weight: Weight: 54.6 kg (120 lb 5.9 oz)       Palliative Assessment/Data:      Patient Active Problem List   Diagnosis Date Noted  . Leukocytosis   . Hypokalemia   . Hydrocephalus 08/23/2017  . Hyponatremia 08/23/2017  . ICH (intracerebral hemorrhage) ([place]) 09/05/2017  . Acute respiratory failure ([place]) 09/01/2017  . Hypertensive emergency 08/31/2017  . Endotracheally intubated   . Gastroesophageal reflux disease without esophagitis 01/07/2016  . Insect sting allergy, current reaction 01/07/2016  . Rhinitis, allergic 08/25/2015  . Recurrent ventral hernia 05/31/2014    Palliative Care Assessment & Plan   Patient Profile: 76 y.o. female with history of hyponatremia, hypertension, and previous SIADH with baseline hyponatremia admitted with large right thalamic ICH.   Recommendations/Plan:  Discussed again with family options moving forward for care.  They are clear that she would not want to continue ventilator support or to pursue aggressive therapies (such as trach, etc) as she will not return to what she would deem a satisfactory quality of life.    Family would like to proceed with compassionate extubation later today.  I placed orders for comfort medications including continuous infusion of morphine, Ativan as needed, and Robinul as needed.  Bedside RN will continue to coordinate  timing of extubation with family and will let me know when they would like to proceed. It is requested that I be present at bedside at time of extubation.  Goals of Care and Additional Recommendations:  Limitations on Scope of Treatment: Full Comfort Care  Code Status:    Code Status Orders        Start     Ordered   15-Sep-2017 1010  DNR (Do not attempt resuscitation)  Continuous    Question Answer Comment  In the event of cardiac or respiratory ARREST Do not call a "code Teresa"   In the event of cardiac or respiratory ARREST Do not perform Intubation, CPR, defibrillation or ACLS   In the event of cardiac or respiratory ARREST Use medication by any route, position, wound care, and other measures to relive pain and suffering. May use oxygen, suction and manual treatment of airway obstruction as needed for comfort.      09-15-17 1012    Code Status History    Date Active Date Inactive Code Status Order ID Comments User Context   09-15-2017 10:08 AM 09/15/17 10:12 AM DNR [id number]  Micheline Rough, MD Inpatient   08/23/2017  9:20 AM 15-Sep-2017 10:08 AM DNR [id number]  Raylene Miyamoto, MD Inpatient   09/03/2017 10:16 PM 08/23/2017  9:20 AM Full Code [id number]  Greta Doom, MD Inpatient       Prognosis:   Hours - Days  Discharge Planning:  Anticipated Hospital Death  Care plan was discussed with family including husband and daughter  Thank you for allowing the Palliative Medicine Team to assist in the care of this patient.   Time In: 0910 Time Out: 1010 Total Time 60 Prolonged Time Billed no      Greater than 50%  of this time was spent counseling and coordinating care related to the above assessment and plan.  Micheline Rough, MD  Please contact Palliative Medicine  Team phone at [phone] for questions and concerns.

## 2017-09-14 NOTE — Procedures (Signed)
Extubation Procedure Note  Patient Details:   Name: Teresa Morales DOB: May 27, 1941 MRN: [id number]   Airway Documentation:  Airway 7.5 mm (Active)  Secured at (cm) 23 cm 2017/09/26 11:49 AM  Measured From Lips September 26, 2017 11:49 AM  [place] September 26, 2017 11:49 AM  Secured By [employer] September 26, 2017 11:49 AM  Tube Holder Repositioned Yes 09/26/2017  7:27 AM  Cuff Pressure (cm H2O) 28 cm H2O 09/26/2017  7:27 AM  Site Condition Dry 09-26-2017  7:27 AM    Evaluation  O2 sats: transiently fell during during procedure Complications: Complications of Terminal extubation Patient did tolerate procedure well. Bilateral Breath Sounds: Clear   Yes  Dimple Nanas 2017-09-26, 12:35 PM

## 2017-09-14 NOTE — Progress Notes (Signed)
Chaplain stopped in via consult for patient at End of Life.  Chaplain introduced herself to patient and met Spouse of the patient, patient's sister and patient's daughter.  Patient has been married 32 years to her husband who is at bedside.  Family is playing music and waiting for the arrival for other family members so that they can continue to celebrate her life as she passes.  Spouse let Chaplain know that their [facility] is coming over to extend support.  Chaplain will remain available should additional support be needed. Ministry of presence and time of reflection with active listening provided for this patient and family.       1039  Clinical Encounter Type  Visited With Patient and family together  Visit Type Initial;Psychological support;Spiritual support;Social support;Critical Care  Referral From Nurse;Physician  Consult/Referral To Chaplain

## 2017-09-14 NOTE — Progress Notes (Signed)
PULMONARY / CRITICAL CARE MEDICINE   Name: Teresa Morales MRN: [id number] DOB: 1941-07-11    ADMISSION DATE:  09/05/2017 CONSULTATION DATE:  09/03/2017  REFERRING MD:  Leonel Ramsay  CHIEF COMPLAINT:  Left sided weakness   HISTORY OF PRESENT ILLNESS:   76 yr old female with recent diagnosis of HTN was well until 9/8 at 6:30pm when she allof a sudden fell in her room and hit her head. She was complaining of headache and then started having left sided weakness. Husband attended to her immediately and called 911. On arrival to the ED there was concern about her protecting her airway so she was intubated after calling code stroke. CT head showed right thalamic hemorrhage with ventricular extension.   Subjective/Overnight:  Cleviprex off  Temps improved ? Localization of RUE, otherwise no new changes No sedation since 9/12 Currently on PS 8/5, doing well   VITAL SIGNS: BP (!) 155/89   Pulse (!) 106   Temp 99.7 F (37.6 C) (Axillary)   Resp (!) 21   Ht 5' 3"  (1.6 m)   Wt 120 lb 5.9 oz (54.6 kg)   LMP 12/15/1993 (Approximate)   SpO2 99%   BMI 21.32 kg/m   HEMODYNAMICS:    VENTILATOR SETTINGS: Vent Mode: CPAP;PSV FiO2 (%):  [28 %] 28 % Set Rate:  [12 bmp] 12 bmp Vt Set:  [420 mL] 420 mL PEEP:  [5 cmH20] 5 cmH20 Pressure Support:  [8 cmH20] 8 cmH20 Plateau Pressure:  [11 cmH20-12 cmH20] 11 cmH20  INTAKE / OUTPUT: I/O last 3 completed shifts: In: 1211.8 [I.V.:86.8; NG/GT:1125] Out: 4401 [Urine:1825]  PHYSICAL EXAMINATION: General:  Elderly female lying in bed in NAD HEENT: MM pink/moist, pupils 3/non responsive, ? Blink to threat Neuro: opens eyes to pain with extension on left, ? Localization in RUE, + cough CV: ST 115, no m/r/g PULM: even/non-labored no MV, lungs bilaterally coarse GI: soft, ND, bs active  Extremities: warm/dry, no edema, bilateral foot boots in place Skin: no rashes    LABS:  BMET  Recent Labs Lab 08/26/17 0653  08/27/17 0600  08/27/17 1924  08/27/17 2355 Sep 23, 2017 0554  NA 153*  < > 157*  < > 160* 157* 156*  K 3.6  --  3.1*  --   --   --  4.1  CL 124*  --  125*  --   --   --  127*  CO2 23  --  27  --   --   --  23  BUN 29*  --  38*  --   --   --  43*  CREATININE 0.47  --  0.50  --   --   --  0.48  GLUCOSE 141*  --  138*  --   --   --  117*  < > = values in this interval not displayed.  Electrolytes  Recent Labs Lab 08/23/17 1819 08/24/17 0150  08/24/17 1633  08/26/17 0653 08/27/17 0600 09-23-17 0554  CALCIUM  --  7.1*  < >  --   < > 8.8* 8.9 8.7*  MG 2.1 1.9  --  2.2  --   --   --   --   PHOS 1.7* 1.7*  --  1.9*  --   --   --   --   < > = values in this interval not displayed.  CBC  Recent Labs Lab 08/26/17 0653 08/27/17 0600 2017-09-23 0554  WBC 11.0* 10.1 12.4*  HGB 12.1 11.6* 11.8*  HCT 35.7* 35.4* 36.2  PLT 242 227 209    Coag's  Recent Labs Lab 08/17/2017 2019  APTT 28  INR 0.93    Sepsis Markers No results for input(s): LATICACIDVEN, PROCALCITON, O2SATVEN in the last 168 hours.  ABG  Recent Labs Lab 08/17/2017 2141 08/25/17 1048  PHART 7.436 7.465*  PCO2ART 43.4 42.5  PO2ART 495.0* 395.0*    Liver Enzymes  Recent Labs Lab 08/24/2017 2019  AST 30  ALT 28  ALKPHOS 64  BILITOT 0.4  ALBUMIN 3.8    Cardiac Enzymes No results for input(s): TROPONINI, PROBNP in the last 168 hours.  Glucose  Recent Labs Lab 08/27/17 1139 08/27/17 1528 08/27/17 1924 08/27/17 2340 08-30-17 0325 August 30, 2017 0824  GLUCAP 131* 147* 121* 130* 157* 137*    Imaging No results found.   STUDIES:  CT head 9/8 >> bright thalamic ICH with ventricular extension  TTE 9/10>> LVEF 65-75% vigorous, G1DD CT head 9/11 >> Slightly decreased size of evolving right thalamic intraparenchymal hemorrhage with intraventricular extension. Slightly worsened surrounding vasogenic edema with 9 mm of localized right-to-left midline shift.  Slightly worsened hydrocephalus with evidence of transependymal flow of  CSF.  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 Acute respiratory failure requiring mechanical ventilation for airway protection Hypertensive emergency Acute encephalopathy secondary to [place] Chronic hyponatremia/ previous SIADH Hypokalemia - improved Tachycardia -  Fever - no obvious source, ? 2/2 neuro HTN - no sedation since 9/11 at [place] PMT assistance, awaiting family decision w/GOC Continue with PRVC and SBT as tolerated, no extubation Continue metoprolol and norvasc Free water 249m per tube q 4hr Trend BMETs\ Strict I/O's, trend UO Replace electrolytes as indicated Continue SSI sensitive PPI for SUP Motrin PRN (allergy to tylenol) for fever, may help with tachycardia  Updates:  No family at the bedside.  Family met with PMT this am and family will continue to discuss GOCs.    CC time 30 min  BKennieth Rad AGACNP-BC [facility] Pgr: [phone] if no answer [phone] 16, 2018 9:56 AM

## 2017-09-14 NOTE — Death Summary Note (Signed)
Death Summary  Patient ID: Teresa Morales   MRN: [id number]      DOB: 08/05/1941  Date of Admission: 08/19/2017 Date of Discharge: 2017/09/26  Attending Physician:  Rosalin Hawking, MD, Stroke MD Consultant(s):   pulmonary/intensive care and palliative care  Patient's PCP:  Josetta Huddle, MD  DISCHARGE DIAGNOSIS:  Right Thalamic ICH IVH Obstructive hydrocephalus Hypertensive emergency  Active Problems:   ICH (intracerebral hemorrhage) ([place])   Acute respiratory failure ([place])   Hypertensive emergency   Hydrocephalus   Hyponatremia   Leukocytosis   Hypokalemia   Past Medical History:  Diagnosis Date  . Anemia    history of borderline anemia  . Arthritis    left thumb and lower back,  . Atypical glandular cells of undetermined significance (AGUS) on cervical Pap smear 12/1998   endo polyp- [name]  . Disc degeneration, lumbar 2016   L4, L5, S1  . Endocervical polyp 1997  . Environmental allergies   . GERD (gastroesophageal reflux disease)   . Headache    Sinusitis  . Hypertension   . Hyponatremia   . Interstitial cystitis 2004   mild  . Melanoma of face ([place]) 04/2007  . Mild dysplasia of cervix    laser  . MRSA infection    many yrs ago -right hand(tx. with oral antibiotic)-no further issues  . Osteopenia    intolerant of bisphosphonates  . 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 Urethral stenosis    dilated  . Varicose vein of leg    Past Surgical History:  Procedure Laterality Date  . COLONOSCOPY WITH PROPOFOL N/A 10/02/2015   Procedure: COLONOSCOPY WITH PROPOFOL;  Surgeon: Garlan Fair, MD;  Location: [facility];  Service: Endoscopy;  Laterality: N/A;  . DILATATION & CURETTAGE/HYSTEROSCOPY WITH MYOSURE N/A 06/29/2017   Procedure: DILATATION & CURETTAGE/HYSTEROSCOPY WITH MYOSURE;  Surgeon: Nunzio Cobbs, MD;  Location: [facility];  Service: Gynecology;  Laterality: N/A;  . HAND  SURGERY     cyst removed from rt hand  . HERNIA REPAIR  1749, 03/19/95   umbilical mesh placed in August 2015.  Marland Kitchen HERNIA REPAIR  02-05-15   bilateral inguinal - mesh  . MELANOMA EXCISION Right    '08- right cheek- no futher issues  . varicose vein Left 2016   past years bilateral  . WISDOM TOOTH EXTRACTION      Allergies as of 2017-09-26      Reactions   Bee Venom Swelling   Reaction to yellow jackets   Macrodantin [nitrofurantoin] Hives, Shortness Of Breath   Chlorhexidine Itching   Redness   Darvocet [propoxyphene N-acetaminophen] Hives, Itching   Tolerates acetaminophen   Endocet [oxycodone-acetaminophen] Hives, Itching   Tolerates acetaminophen   Floxin [ofloxacin] Itching   Latex Itching   Penetrex [enoxacin] Itching   Sulfa Antibiotics Other (See Comments)   Unknown - childhood allergy    Hibiclens [chlorhexidine Gluconate] Rash   Local reaction with previous surgery.       LABORATORY STUDIES CBC    Component Value Date/Time   WBC 12.4 (H) September 26, 2017 0554   RBC 3.76 (L) Sep 26, 2017 0554   HGB 11.8 (L) 09/26/2017 0554   HCT 36.2 September 26, 2017 0554   PLT 209 2017/09/26 0554   MCV 96.3 2017-09-26 0554   MCH 31.4 09-26-17 0554   MCHC 32.6 Sep 26, 2017 0554   RDW 15.4 09-26-2017 0554   LYMPHSABS 1.1 08/20/2017 2019  MONOABS 0.5 09/10/2017 2019   EOSABS 0.1 08/27/2017 2019   BASOSABS 0.1 08/23/2017 2019   CMP    Component Value Date/Time   NA 156 (H) Sep 14, 2017 0554   K 4.1 2017/09/14 0554   CL 127 (H) 14-Sep-2017 0554   CO2 23 09/14/2017 0554   GLUCOSE 117 (H) 09-14-17 0554   BUN 43 (H) 14-Sep-2017 0554   CREATININE 0.48 Sep 14, 2017 0554   CALCIUM 8.7 (L) Sep 14, 2017 0554   PROT 6.7 08/27/2017 2019   ALBUMIN 3.8 08/31/2017 2019   AST 30 08/21/2017 2019   ALT 28 08/26/2017 2019   ALKPHOS 64 08/15/2017 2019   BILITOT 0.4 08/31/2017 2019   GFRNONAA >60 2017-09-14 0554   GFRAA >60 Sep 14, 2017 0554   COAGS Lab Results  Component Value Date   INR 0.93  09/08/2017   Lipid Panel    Component Value Date/Time   TRIG 64 08/27/2017 0058   HgbA1C  Lab Results  Component Value Date   HGBA1C 5.4 08/24/2017   Urinalysis    Component Value Date/Time   COLORURINE YELLOW 08/27/2017 1342   APPEARANCEUR HAZY (A) 08/27/2017 1342   LABSPEC 1.010 08/27/2017 1342   PHURINE 6.0 08/27/2017 1342   GLUCOSEU NEGATIVE 08/27/2017 1342   HGBUR LARGE (A) 08/27/2017 1342   BILIRUBINUR NEGATIVE 08/27/2017 1342   BILIRUBINUR N 07/03/2017 0943   KETONESUR NEGATIVE 08/27/2017 1342   PROTEINUR 30 (A) 08/27/2017 1342   UROBILINOGEN 0.2 07/03/2017 0943   NITRITE NEGATIVE 08/27/2017 1342   LEUKOCYTESUR LARGE (A) 08/27/2017 1342   Urine Drug Screen No results found for: LABOPIA, COCAINSCRNUR, LABBENZ, AMPHETMU, THCU, LABBARB  Alcohol Level No results found for: [place] I have personally reviewed the radiological images below and agree with the radiology interpretations.  Ct Head Wo Contrast 08/26/2017 IMPRESSION:  Acute right thalamic hemorrhage with intraventricular extension. Trapping of the right temporal horn.   08/23/2017 IMPRESSION:  1. Evolving large RIGHT thalamic hematoma extending to midbrain with similar edema and mass effect including 8 mm RIGHT to LEFT midline shift.  2. Similar intraventricular extension of hematoma with stable moderate hydrocephalus.   08/23/2017 1. Evolving large RIGHT thalamic intraparenchymal hematoma extending to the midbrain with similar edema and mass effect.  2. Intraventricular extension of hemorrhage with worsening moderate hydrocephalus.   Ct Cervical Spine Wo Contrast 09/05/2017 IMPRESSION:  Mild degenerative disc disease in the cervical spine without acute fracture or subluxation.   TTE - Left ventricle: The cavity size was normal. Wall thickness was normal. Systolic function was vigorous. The estimated ejection fraction was in the range of 65% to 70%. Doppler parameters  are consistent with abnormal left ventricular relaxation (grade 1 diastolic dysfunction).  Ct Head Wo Contrast 08/25/2017 IMPRESSION: 1. Slightly decreased size of evolving right thalamic intraparenchymal hemorrhage with intraventricular extension. 2. Slightly worsened surrounding vasogenic edema with 9 mm of localized right-to-left midline shift. Slightly worsened hydrocephalus with evidence of transependymal flow of CSF.     HISTORY OF PRESENT ILLNESS Teresa Morales is a 76 y.o. female was last in her normal state of health just prior to 6:30 PM when he heard her fall. He went in and try to help her up, but she was unable to get up and wanted to take some time do get her strength back. When he tried to get her up again a little bit later he noticed that she was having trouble pushing up with her left arm and it was at that point that  he decided to call 911. LKW: 6:30 PM tpa given?: no, ICH ICH Score: 2 ROS: Unable to obtain due to altered mental status.    HOSPITAL COURSE Teresa Morales is a 76 y.o. female with history of hyponatremia, hypertension, and previous SIADH with baseline hyponatremia presenting with acute left hemiparesis.   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  Resultant  Intubated, left hemiparesis  CT head - Evolving large RIGHT thalamic hematoma extending to midbrain with similar edema and mass effect   CT repeat x 2 showed developing but stable moderate hydrocephalus  2D Echo  EF 65-70%  HgbA1c 5.4  VTE prophylaxis - SCDs  Diet NPO time specified  No antithrombotic prior to admission, now on No antithrombotic.   Palliative care consulted. After their discussion with family, family request full comfort care. Pt was extubated. Following extubation, some stridor and increased work of breathing which improved with bolus dosing of morphine. Then, increased periods of apnea followed by asystole noted on  monitor. Pt deceased at 12:56pm.   Hydrocephalus   CT repeat x 3 showed stable developing hydrocephalus  Family hesitant with any procedure as per pt wishes  Close monitoring  off 3% saline now   Na at goal 148-146-150-155->157  Repeat CT today showed largely unchanged hematoma and hydrocephalus  Hyponatremia at baseline   Na 124-128 at baseline  Off 3% saline now  Na goal 145-155  Respiratory failure  Intubated on vent  Extubated for comfort care  Hypertension  Stable  On comfort care  Other Stroke Risk Factors  Advanced age  Other Active Problems  Hypokalemia - supplemented  Mild leukocytosis - 11.3->10.6->11.1->11.0->10.1  Potential pulmonary nodule  DISCHARGE EXAM Pt deceased  35 minutes were spent preparing discharge.  Rosalin Hawking, MD PhD Stroke Neurology 09/15/2017 4:05 PM

## 2017-09-14 NOTE — Final Progress Note (Signed)
Palliative care progress note/pronouncement:  At bedside for one way extubation of Ms. Teresa Morales.  Premedicated and extubated to room air.  Following extubation, some stridor and increased work of breathing which improved with bolus dosing of morphine.  Increased periods of apnea followed by asystole noted on monitor.  On exam: Pupils fixed and dilated No central or peripheral pulses No auscultatable heart or breath sounds for > 1 minute No withdrawal to painful stimuli   Time of death: 12:56 PM  Attending to be notified by bedside RN.   I informed family of death and answered questions to the best of my ability.  Micheline Rough, MD [place] Team [phone]

## 2017-09-14 NOTE — Progress Notes (Signed)
240 mL of morphine wasted in sink with RN Lauren.

## 2017-09-14 DEATH — deceased

## 2018-04-22 ENCOUNTER — Ambulatory Visit: Payer: Medicare Other | Admitting: Obstetrics and Gynecology
